# Patient Record
Sex: Male | Born: 1940 | Race: White | Hispanic: No | Marital: Married | Smoking: Former smoker
Health system: Southern US, Community
[De-identification: ages and names within clinical notes are randomized; demographics above are authoritative.]

## PROBLEM LIST (undated history)

## (undated) DIAGNOSIS — K219 Gastro-esophageal reflux disease without esophagitis: Secondary | ICD-10-CM

## (undated) DIAGNOSIS — M109 Gout, unspecified: Secondary | ICD-10-CM

## (undated) DIAGNOSIS — I779 Disorder of arteries and arterioles, unspecified: Secondary | ICD-10-CM

## (undated) DIAGNOSIS — E782 Mixed hyperlipidemia: Secondary | ICD-10-CM

## (undated) DIAGNOSIS — H9193 Unspecified hearing loss, bilateral: Secondary | ICD-10-CM

## (undated) DIAGNOSIS — E559 Vitamin D deficiency, unspecified: Secondary | ICD-10-CM

## (undated) DIAGNOSIS — I350 Nonrheumatic aortic (valve) stenosis: Secondary | ICD-10-CM

## (undated) DIAGNOSIS — E119 Type 2 diabetes mellitus without complications: Secondary | ICD-10-CM

## (undated) DIAGNOSIS — I10 Essential (primary) hypertension: Secondary | ICD-10-CM

## (undated) DIAGNOSIS — M1990 Unspecified osteoarthritis, unspecified site: Secondary | ICD-10-CM

## (undated) DIAGNOSIS — I25119 Atherosclerotic heart disease of native coronary artery with unspecified angina pectoris: Secondary | ICD-10-CM

## (undated) DIAGNOSIS — G5602 Carpal tunnel syndrome, left upper limb: Secondary | ICD-10-CM

## (undated) HISTORY — DX: Gastro-esophageal reflux disease without esophagitis: K21.9

## (undated) HISTORY — PX: LUMBAR FUSION: SHX111

## (undated) HISTORY — DX: Mixed hyperlipidemia: E78.2

## (undated) HISTORY — DX: Carpal tunnel syndrome, left upper limb: G56.02

## (undated) HISTORY — DX: Type 2 diabetes mellitus without complications: E11.9

## (undated) HISTORY — PX: CARPAL TUNNEL RELEASE: SHX101

## (undated) HISTORY — DX: Essential (primary) hypertension: I10

## (undated) HISTORY — DX: Atherosclerotic heart disease of native coronary artery with unspecified angina pectoris: I25.119

## (undated) HISTORY — PX: OTHER SURGICAL HISTORY: SHX169

## (undated) HISTORY — DX: Vitamin D deficiency, unspecified: E55.9

## (undated) HISTORY — DX: Unspecified hearing loss, bilateral: H91.93

## (undated) HISTORY — PX: ESOPHAGOGASTRODUODENOSCOPY (EGD) WITH ESOPHAGEAL DILATION: SHX5812

## (undated) HISTORY — PX: CERVICAL FUSION: SHX112

## (undated) HISTORY — DX: Gout, unspecified: M10.9

---

## 2002-05-22 ENCOUNTER — Encounter: Payer: Self-pay | Admitting: Neurosurgery

## 2002-05-26 ENCOUNTER — Encounter: Payer: Self-pay | Admitting: Neurosurgery

## 2002-05-26 ENCOUNTER — Inpatient Hospital Stay (HOSPITAL_COMMUNITY): Admission: RE | Admit: 2002-05-26 | Discharge: 2002-05-27 | Payer: Self-pay | Admitting: Neurosurgery

## 2011-10-10 DIAGNOSIS — E782 Mixed hyperlipidemia: Secondary | ICD-10-CM | POA: Diagnosis not present

## 2011-10-10 DIAGNOSIS — E119 Type 2 diabetes mellitus without complications: Secondary | ICD-10-CM | POA: Diagnosis not present

## 2011-10-10 DIAGNOSIS — I10 Essential (primary) hypertension: Secondary | ICD-10-CM | POA: Diagnosis not present

## 2011-10-10 DIAGNOSIS — E663 Overweight: Secondary | ICD-10-CM | POA: Diagnosis not present

## 2011-10-16 DIAGNOSIS — C44319 Basal cell carcinoma of skin of other parts of face: Secondary | ICD-10-CM | POA: Diagnosis not present

## 2011-11-05 DIAGNOSIS — D485 Neoplasm of uncertain behavior of skin: Secondary | ICD-10-CM | POA: Diagnosis not present

## 2011-11-05 DIAGNOSIS — C44319 Basal cell carcinoma of skin of other parts of face: Secondary | ICD-10-CM | POA: Diagnosis not present

## 2011-11-05 DIAGNOSIS — L219 Seborrheic dermatitis, unspecified: Secondary | ICD-10-CM | POA: Diagnosis not present

## 2011-11-05 DIAGNOSIS — D2370 Other benign neoplasm of skin of unspecified lower limb, including hip: Secondary | ICD-10-CM | POA: Diagnosis not present

## 2011-11-07 DIAGNOSIS — C44319 Basal cell carcinoma of skin of other parts of face: Secondary | ICD-10-CM | POA: Diagnosis not present

## 2011-11-20 DIAGNOSIS — C44611 Basal cell carcinoma of skin of unspecified upper limb, including shoulder: Secondary | ICD-10-CM | POA: Diagnosis not present

## 2012-03-25 DIAGNOSIS — L219 Seborrheic dermatitis, unspecified: Secondary | ICD-10-CM | POA: Diagnosis not present

## 2012-03-25 DIAGNOSIS — D2330 Other benign neoplasm of skin of unspecified part of face: Secondary | ICD-10-CM | POA: Diagnosis not present

## 2012-06-19 DIAGNOSIS — Z23 Encounter for immunization: Secondary | ICD-10-CM | POA: Diagnosis not present

## 2012-10-23 DIAGNOSIS — E119 Type 2 diabetes mellitus without complications: Secondary | ICD-10-CM | POA: Diagnosis not present

## 2012-10-23 DIAGNOSIS — E782 Mixed hyperlipidemia: Secondary | ICD-10-CM | POA: Diagnosis not present

## 2012-10-23 DIAGNOSIS — Z79899 Other long term (current) drug therapy: Secondary | ICD-10-CM | POA: Diagnosis not present

## 2012-10-23 DIAGNOSIS — Z125 Encounter for screening for malignant neoplasm of prostate: Secondary | ICD-10-CM | POA: Diagnosis not present

## 2013-04-07 DIAGNOSIS — E782 Mixed hyperlipidemia: Secondary | ICD-10-CM | POA: Diagnosis not present

## 2013-04-07 DIAGNOSIS — E119 Type 2 diabetes mellitus without complications: Secondary | ICD-10-CM | POA: Diagnosis not present

## 2013-04-07 DIAGNOSIS — Z79899 Other long term (current) drug therapy: Secondary | ICD-10-CM | POA: Diagnosis not present

## 2013-04-14 DIAGNOSIS — Z0000 Encounter for general adult medical examination without abnormal findings: Secondary | ICD-10-CM | POA: Diagnosis not present

## 2013-04-14 DIAGNOSIS — I10 Essential (primary) hypertension: Secondary | ICD-10-CM | POA: Diagnosis not present

## 2013-04-14 DIAGNOSIS — IMO0002 Reserved for concepts with insufficient information to code with codable children: Secondary | ICD-10-CM | POA: Diagnosis not present

## 2013-04-14 DIAGNOSIS — E782 Mixed hyperlipidemia: Secondary | ICD-10-CM | POA: Diagnosis not present

## 2013-04-22 DIAGNOSIS — M5126 Other intervertebral disc displacement, lumbar region: Secondary | ICD-10-CM | POA: Diagnosis not present

## 2013-04-22 DIAGNOSIS — M48061 Spinal stenosis, lumbar region without neurogenic claudication: Secondary | ICD-10-CM | POA: Diagnosis not present

## 2013-06-04 DIAGNOSIS — M5106 Intervertebral disc disorders with myelopathy, lumbar region: Secondary | ICD-10-CM | POA: Diagnosis not present

## 2013-06-04 DIAGNOSIS — M4310 Spondylolisthesis, site unspecified: Secondary | ICD-10-CM | POA: Diagnosis not present

## 2013-06-04 DIAGNOSIS — M48061 Spinal stenosis, lumbar region without neurogenic claudication: Secondary | ICD-10-CM | POA: Diagnosis not present

## 2013-06-05 DIAGNOSIS — Z01818 Encounter for other preprocedural examination: Secondary | ICD-10-CM | POA: Diagnosis not present

## 2013-06-08 DIAGNOSIS — Z23 Encounter for immunization: Secondary | ICD-10-CM | POA: Diagnosis not present

## 2013-06-08 DIAGNOSIS — Z79899 Other long term (current) drug therapy: Secondary | ICD-10-CM | POA: Diagnosis not present

## 2013-06-08 DIAGNOSIS — I10 Essential (primary) hypertension: Secondary | ICD-10-CM | POA: Diagnosis present

## 2013-06-08 DIAGNOSIS — E119 Type 2 diabetes mellitus without complications: Secondary | ICD-10-CM | POA: Diagnosis present

## 2013-06-08 DIAGNOSIS — M4716 Other spondylosis with myelopathy, lumbar region: Secondary | ICD-10-CM | POA: Diagnosis not present

## 2013-06-08 DIAGNOSIS — Z4789 Encounter for other orthopedic aftercare: Secondary | ICD-10-CM | POA: Diagnosis not present

## 2013-06-08 DIAGNOSIS — K219 Gastro-esophageal reflux disease without esophagitis: Secondary | ICD-10-CM | POA: Diagnosis present

## 2013-06-08 DIAGNOSIS — M48061 Spinal stenosis, lumbar region without neurogenic claudication: Secondary | ICD-10-CM | POA: Diagnosis not present

## 2013-06-08 DIAGNOSIS — Q762 Congenital spondylolisthesis: Secondary | ICD-10-CM | POA: Diagnosis not present

## 2013-06-08 DIAGNOSIS — Z9889 Other specified postprocedural states: Secondary | ICD-10-CM | POA: Diagnosis not present

## 2013-07-03 DIAGNOSIS — Z23 Encounter for immunization: Secondary | ICD-10-CM | POA: Diagnosis not present

## 2013-07-08 DIAGNOSIS — M5106 Intervertebral disc disorders with myelopathy, lumbar region: Secondary | ICD-10-CM | POA: Diagnosis not present

## 2013-07-08 DIAGNOSIS — M48061 Spinal stenosis, lumbar region without neurogenic claudication: Secondary | ICD-10-CM | POA: Diagnosis not present

## 2013-07-08 DIAGNOSIS — Q762 Congenital spondylolisthesis: Secondary | ICD-10-CM | POA: Diagnosis not present

## 2013-07-08 DIAGNOSIS — M4716 Other spondylosis with myelopathy, lumbar region: Secondary | ICD-10-CM | POA: Diagnosis not present

## 2013-09-03 DIAGNOSIS — M5106 Intervertebral disc disorders with myelopathy, lumbar region: Secondary | ICD-10-CM | POA: Diagnosis not present

## 2013-09-03 DIAGNOSIS — Q762 Congenital spondylolisthesis: Secondary | ICD-10-CM | POA: Diagnosis not present

## 2013-09-03 DIAGNOSIS — M48061 Spinal stenosis, lumbar region without neurogenic claudication: Secondary | ICD-10-CM | POA: Diagnosis not present

## 2013-09-03 DIAGNOSIS — M4716 Other spondylosis with myelopathy, lumbar region: Secondary | ICD-10-CM | POA: Diagnosis not present

## 2013-10-27 DIAGNOSIS — E119 Type 2 diabetes mellitus without complications: Secondary | ICD-10-CM | POA: Diagnosis not present

## 2013-10-27 DIAGNOSIS — Z1329 Encounter for screening for other suspected endocrine disorder: Secondary | ICD-10-CM | POA: Diagnosis not present

## 2013-10-27 DIAGNOSIS — Z130 Encounter for screening for diseases of the blood and blood-forming organs and certain disorders involving the immune mechanism: Secondary | ICD-10-CM | POA: Diagnosis not present

## 2013-10-27 DIAGNOSIS — I10 Essential (primary) hypertension: Secondary | ICD-10-CM | POA: Diagnosis not present

## 2013-10-27 DIAGNOSIS — E559 Vitamin D deficiency, unspecified: Secondary | ICD-10-CM | POA: Diagnosis not present

## 2013-10-27 DIAGNOSIS — E782 Mixed hyperlipidemia: Secondary | ICD-10-CM | POA: Diagnosis not present

## 2013-10-27 DIAGNOSIS — K2100 Gastro-esophageal reflux disease with esophagitis, without bleeding: Secondary | ICD-10-CM | POA: Diagnosis not present

## 2013-10-27 DIAGNOSIS — Z125 Encounter for screening for malignant neoplasm of prostate: Secondary | ICD-10-CM | POA: Diagnosis not present

## 2013-12-10 DIAGNOSIS — M4716 Other spondylosis with myelopathy, lumbar region: Secondary | ICD-10-CM | POA: Diagnosis not present

## 2013-12-10 DIAGNOSIS — M5106 Intervertebral disc disorders with myelopathy, lumbar region: Secondary | ICD-10-CM | POA: Diagnosis not present

## 2013-12-10 DIAGNOSIS — Q762 Congenital spondylolisthesis: Secondary | ICD-10-CM | POA: Diagnosis not present

## 2013-12-10 DIAGNOSIS — M48061 Spinal stenosis, lumbar region without neurogenic claudication: Secondary | ICD-10-CM | POA: Diagnosis not present

## 2014-01-25 DIAGNOSIS — E119 Type 2 diabetes mellitus without complications: Secondary | ICD-10-CM | POA: Diagnosis not present

## 2014-01-26 DIAGNOSIS — L219 Seborrheic dermatitis, unspecified: Secondary | ICD-10-CM | POA: Diagnosis not present

## 2014-01-26 DIAGNOSIS — C44211 Basal cell carcinoma of skin of unspecified ear and external auricular canal: Secondary | ICD-10-CM | POA: Diagnosis not present

## 2014-01-26 DIAGNOSIS — L570 Actinic keratosis: Secondary | ICD-10-CM | POA: Diagnosis not present

## 2014-02-10 DIAGNOSIS — C44211 Basal cell carcinoma of skin of unspecified ear and external auricular canal: Secondary | ICD-10-CM | POA: Diagnosis not present

## 2014-06-04 DIAGNOSIS — Z23 Encounter for immunization: Secondary | ICD-10-CM | POA: Diagnosis not present

## 2014-06-04 DIAGNOSIS — J209 Acute bronchitis, unspecified: Secondary | ICD-10-CM | POA: Diagnosis not present

## 2014-06-04 DIAGNOSIS — E119 Type 2 diabetes mellitus without complications: Secondary | ICD-10-CM | POA: Diagnosis not present

## 2014-06-22 DIAGNOSIS — B079 Viral wart, unspecified: Secondary | ICD-10-CM | POA: Diagnosis not present

## 2014-06-22 DIAGNOSIS — L570 Actinic keratosis: Secondary | ICD-10-CM | POA: Diagnosis not present

## 2014-06-22 DIAGNOSIS — L821 Other seborrheic keratosis: Secondary | ICD-10-CM | POA: Diagnosis not present

## 2014-07-12 DIAGNOSIS — H9193 Unspecified hearing loss, bilateral: Secondary | ICD-10-CM | POA: Diagnosis not present

## 2014-07-15 DIAGNOSIS — H9313 Tinnitus, bilateral: Secondary | ICD-10-CM | POA: Diagnosis not present

## 2014-07-15 DIAGNOSIS — H903 Sensorineural hearing loss, bilateral: Secondary | ICD-10-CM | POA: Diagnosis not present

## 2014-08-16 DIAGNOSIS — J40 Bronchitis, not specified as acute or chronic: Secondary | ICD-10-CM | POA: Diagnosis not present

## 2014-10-14 DIAGNOSIS — M5431 Sciatica, right side: Secondary | ICD-10-CM | POA: Diagnosis not present

## 2014-10-28 DIAGNOSIS — K219 Gastro-esophageal reflux disease without esophagitis: Secondary | ICD-10-CM | POA: Diagnosis not present

## 2014-10-28 DIAGNOSIS — Z0000 Encounter for general adult medical examination without abnormal findings: Secondary | ICD-10-CM | POA: Diagnosis not present

## 2014-10-28 DIAGNOSIS — E559 Vitamin D deficiency, unspecified: Secondary | ICD-10-CM | POA: Diagnosis not present

## 2014-10-28 DIAGNOSIS — E782 Mixed hyperlipidemia: Secondary | ICD-10-CM | POA: Diagnosis not present

## 2014-10-28 DIAGNOSIS — Z23 Encounter for immunization: Secondary | ICD-10-CM | POA: Diagnosis not present

## 2014-10-28 DIAGNOSIS — E1165 Type 2 diabetes mellitus with hyperglycemia: Secondary | ICD-10-CM | POA: Diagnosis not present

## 2014-10-28 DIAGNOSIS — I10 Essential (primary) hypertension: Secondary | ICD-10-CM | POA: Diagnosis not present

## 2014-10-28 DIAGNOSIS — Z79899 Other long term (current) drug therapy: Secondary | ICD-10-CM | POA: Diagnosis not present

## 2014-10-28 DIAGNOSIS — Z125 Encounter for screening for malignant neoplasm of prostate: Secondary | ICD-10-CM | POA: Diagnosis not present

## 2014-11-02 DIAGNOSIS — Z1211 Encounter for screening for malignant neoplasm of colon: Secondary | ICD-10-CM | POA: Diagnosis not present

## 2014-11-25 DIAGNOSIS — H524 Presbyopia: Secondary | ICD-10-CM | POA: Diagnosis not present

## 2014-11-25 DIAGNOSIS — E119 Type 2 diabetes mellitus without complications: Secondary | ICD-10-CM | POA: Diagnosis not present

## 2014-11-25 DIAGNOSIS — H2513 Age-related nuclear cataract, bilateral: Secondary | ICD-10-CM | POA: Diagnosis not present

## 2015-01-17 DIAGNOSIS — T1512XA Foreign body in conjunctival sac, left eye, initial encounter: Secondary | ICD-10-CM | POA: Diagnosis not present

## 2015-04-15 DIAGNOSIS — E119 Type 2 diabetes mellitus without complications: Secondary | ICD-10-CM | POA: Diagnosis not present

## 2015-04-15 DIAGNOSIS — I10 Essential (primary) hypertension: Secondary | ICD-10-CM | POA: Diagnosis not present

## 2015-04-15 DIAGNOSIS — E780 Pure hypercholesterolemia: Secondary | ICD-10-CM | POA: Diagnosis not present

## 2015-04-15 DIAGNOSIS — S335XXA Sprain of ligaments of lumbar spine, initial encounter: Secondary | ICD-10-CM | POA: Diagnosis not present

## 2015-04-26 DIAGNOSIS — Z1389 Encounter for screening for other disorder: Secondary | ICD-10-CM | POA: Diagnosis not present

## 2015-04-26 DIAGNOSIS — Z9181 History of falling: Secondary | ICD-10-CM | POA: Diagnosis not present

## 2015-04-26 DIAGNOSIS — R1314 Dysphagia, pharyngoesophageal phase: Secondary | ICD-10-CM | POA: Diagnosis not present

## 2015-04-26 DIAGNOSIS — M545 Low back pain: Secondary | ICD-10-CM | POA: Diagnosis not present

## 2015-05-12 DIAGNOSIS — K219 Gastro-esophageal reflux disease without esophagitis: Secondary | ICD-10-CM | POA: Diagnosis not present

## 2015-05-12 DIAGNOSIS — R1310 Dysphagia, unspecified: Secondary | ICD-10-CM | POA: Diagnosis not present

## 2015-05-13 DIAGNOSIS — R1310 Dysphagia, unspecified: Secondary | ICD-10-CM | POA: Diagnosis not present

## 2015-05-19 DIAGNOSIS — I10 Essential (primary) hypertension: Secondary | ICD-10-CM | POA: Diagnosis not present

## 2015-05-19 DIAGNOSIS — K222 Esophageal obstruction: Secondary | ICD-10-CM | POA: Diagnosis not present

## 2015-05-19 DIAGNOSIS — F17220 Nicotine dependence, chewing tobacco, uncomplicated: Secondary | ICD-10-CM | POA: Diagnosis not present

## 2015-05-19 DIAGNOSIS — K2270 Barrett's esophagus without dysplasia: Secondary | ICD-10-CM | POA: Diagnosis not present

## 2015-05-19 DIAGNOSIS — E119 Type 2 diabetes mellitus without complications: Secondary | ICD-10-CM | POA: Diagnosis not present

## 2015-05-19 DIAGNOSIS — R1310 Dysphagia, unspecified: Secondary | ICD-10-CM | POA: Diagnosis not present

## 2015-05-19 DIAGNOSIS — K449 Diaphragmatic hernia without obstruction or gangrene: Secondary | ICD-10-CM | POA: Diagnosis not present

## 2015-06-17 DIAGNOSIS — Z23 Encounter for immunization: Secondary | ICD-10-CM | POA: Diagnosis not present

## 2015-06-23 DIAGNOSIS — R1310 Dysphagia, unspecified: Secondary | ICD-10-CM | POA: Diagnosis not present

## 2015-06-23 DIAGNOSIS — K222 Esophageal obstruction: Secondary | ICD-10-CM | POA: Diagnosis not present

## 2015-06-23 DIAGNOSIS — K2270 Barrett's esophagus without dysplasia: Secondary | ICD-10-CM | POA: Diagnosis not present

## 2015-10-05 DIAGNOSIS — G5603 Carpal tunnel syndrome, bilateral upper limbs: Secondary | ICD-10-CM | POA: Diagnosis not present

## 2015-10-10 DIAGNOSIS — M79642 Pain in left hand: Secondary | ICD-10-CM | POA: Diagnosis not present

## 2015-10-10 DIAGNOSIS — M79641 Pain in right hand: Secondary | ICD-10-CM | POA: Diagnosis not present

## 2015-10-26 DIAGNOSIS — M65341 Trigger finger, right ring finger: Secondary | ICD-10-CM | POA: Diagnosis not present

## 2015-10-26 DIAGNOSIS — M65331 Trigger finger, right middle finger: Secondary | ICD-10-CM | POA: Diagnosis not present

## 2015-10-26 DIAGNOSIS — G5601 Carpal tunnel syndrome, right upper limb: Secondary | ICD-10-CM | POA: Diagnosis not present

## 2015-11-02 DIAGNOSIS — E1165 Type 2 diabetes mellitus with hyperglycemia: Secondary | ICD-10-CM | POA: Diagnosis not present

## 2015-11-02 DIAGNOSIS — E782 Mixed hyperlipidemia: Secondary | ICD-10-CM | POA: Diagnosis not present

## 2015-11-02 DIAGNOSIS — H9193 Unspecified hearing loss, bilateral: Secondary | ICD-10-CM | POA: Diagnosis not present

## 2015-11-02 DIAGNOSIS — Z79899 Other long term (current) drug therapy: Secondary | ICD-10-CM | POA: Diagnosis not present

## 2015-11-02 DIAGNOSIS — K219 Gastro-esophageal reflux disease without esophagitis: Secondary | ICD-10-CM | POA: Diagnosis not present

## 2015-11-02 DIAGNOSIS — Z125 Encounter for screening for malignant neoplasm of prostate: Secondary | ICD-10-CM | POA: Diagnosis not present

## 2015-11-02 DIAGNOSIS — E559 Vitamin D deficiency, unspecified: Secondary | ICD-10-CM | POA: Diagnosis not present

## 2015-11-02 DIAGNOSIS — Z0000 Encounter for general adult medical examination without abnormal findings: Secondary | ICD-10-CM | POA: Diagnosis not present

## 2015-11-08 DIAGNOSIS — G5601 Carpal tunnel syndrome, right upper limb: Secondary | ICD-10-CM | POA: Diagnosis not present

## 2015-11-08 DIAGNOSIS — M65331 Trigger finger, right middle finger: Secondary | ICD-10-CM | POA: Diagnosis not present

## 2015-11-08 DIAGNOSIS — M65341 Trigger finger, right ring finger: Secondary | ICD-10-CM | POA: Diagnosis not present

## 2015-12-01 DIAGNOSIS — H524 Presbyopia: Secondary | ICD-10-CM | POA: Diagnosis not present

## 2015-12-01 DIAGNOSIS — H25811 Combined forms of age-related cataract, right eye: Secondary | ICD-10-CM | POA: Diagnosis not present

## 2015-12-01 DIAGNOSIS — H2512 Age-related nuclear cataract, left eye: Secondary | ICD-10-CM | POA: Diagnosis not present

## 2015-12-01 DIAGNOSIS — E113293 Type 2 diabetes mellitus with mild nonproliferative diabetic retinopathy without macular edema, bilateral: Secondary | ICD-10-CM | POA: Diagnosis not present

## 2016-05-25 ENCOUNTER — Other Ambulatory Visit: Payer: Self-pay

## 2016-06-18 DIAGNOSIS — Z0000 Encounter for general adult medical examination without abnormal findings: Secondary | ICD-10-CM | POA: Diagnosis not present

## 2016-06-18 DIAGNOSIS — Z23 Encounter for immunization: Secondary | ICD-10-CM | POA: Diagnosis not present

## 2016-09-26 DIAGNOSIS — H6501 Acute serous otitis media, right ear: Secondary | ICD-10-CM | POA: Diagnosis not present

## 2016-09-26 DIAGNOSIS — H6121 Impacted cerumen, right ear: Secondary | ICD-10-CM | POA: Diagnosis not present

## 2016-11-06 DIAGNOSIS — E782 Mixed hyperlipidemia: Secondary | ICD-10-CM | POA: Diagnosis not present

## 2016-11-06 DIAGNOSIS — K219 Gastro-esophageal reflux disease without esophagitis: Secondary | ICD-10-CM | POA: Diagnosis not present

## 2016-11-06 DIAGNOSIS — E1165 Type 2 diabetes mellitus with hyperglycemia: Secondary | ICD-10-CM | POA: Diagnosis not present

## 2016-11-06 DIAGNOSIS — H9193 Unspecified hearing loss, bilateral: Secondary | ICD-10-CM | POA: Diagnosis not present

## 2016-11-06 DIAGNOSIS — Q762 Congenital spondylolisthesis: Secondary | ICD-10-CM | POA: Diagnosis not present

## 2016-11-06 DIAGNOSIS — Z79899 Other long term (current) drug therapy: Secondary | ICD-10-CM | POA: Diagnosis not present

## 2016-11-06 DIAGNOSIS — Z125 Encounter for screening for malignant neoplasm of prostate: Secondary | ICD-10-CM | POA: Diagnosis not present

## 2016-11-06 DIAGNOSIS — Z0000 Encounter for general adult medical examination without abnormal findings: Secondary | ICD-10-CM | POA: Diagnosis not present

## 2016-11-13 DIAGNOSIS — Z1211 Encounter for screening for malignant neoplasm of colon: Secondary | ICD-10-CM | POA: Diagnosis not present

## 2016-12-03 DIAGNOSIS — E113293 Type 2 diabetes mellitus with mild nonproliferative diabetic retinopathy without macular edema, bilateral: Secondary | ICD-10-CM | POA: Diagnosis not present

## 2016-12-03 DIAGNOSIS — H25813 Combined forms of age-related cataract, bilateral: Secondary | ICD-10-CM | POA: Diagnosis not present

## 2017-02-01 DIAGNOSIS — T1511XA Foreign body in conjunctival sac, right eye, initial encounter: Secondary | ICD-10-CM | POA: Diagnosis not present

## 2017-02-14 DIAGNOSIS — H6123 Impacted cerumen, bilateral: Secondary | ICD-10-CM | POA: Diagnosis not present

## 2017-02-14 DIAGNOSIS — H9212 Otorrhea, left ear: Secondary | ICD-10-CM | POA: Diagnosis not present

## 2017-02-21 DIAGNOSIS — H9212 Otorrhea, left ear: Secondary | ICD-10-CM | POA: Diagnosis not present

## 2017-04-05 DIAGNOSIS — M10071 Idiopathic gout, right ankle and foot: Secondary | ICD-10-CM | POA: Diagnosis not present

## 2017-05-30 DIAGNOSIS — Z23 Encounter for immunization: Secondary | ICD-10-CM | POA: Diagnosis not present

## 2017-06-04 DIAGNOSIS — H04123 Dry eye syndrome of bilateral lacrimal glands: Secondary | ICD-10-CM | POA: Diagnosis not present

## 2017-06-04 DIAGNOSIS — E113293 Type 2 diabetes mellitus with mild nonproliferative diabetic retinopathy without macular edema, bilateral: Secondary | ICD-10-CM | POA: Diagnosis not present

## 2017-06-04 DIAGNOSIS — H2513 Age-related nuclear cataract, bilateral: Secondary | ICD-10-CM | POA: Diagnosis not present

## 2017-06-04 DIAGNOSIS — H524 Presbyopia: Secondary | ICD-10-CM | POA: Diagnosis not present

## 2020-01-14 ENCOUNTER — Encounter: Payer: Self-pay | Admitting: Thoracic Surgery (Cardiothoracic Vascular Surgery)

## 2020-01-14 ENCOUNTER — Encounter: Payer: Self-pay | Admitting: *Deleted

## 2020-01-14 DIAGNOSIS — R0989 Other specified symptoms and signs involving the circulatory and respiratory systems: Secondary | ICD-10-CM

## 2020-01-14 DIAGNOSIS — I209 Angina pectoris, unspecified: Secondary | ICD-10-CM | POA: Insufficient documentation

## 2020-01-14 DIAGNOSIS — H9193 Unspecified hearing loss, bilateral: Secondary | ICD-10-CM | POA: Insufficient documentation

## 2020-01-14 DIAGNOSIS — I10 Essential (primary) hypertension: Secondary | ICD-10-CM | POA: Insufficient documentation

## 2020-01-14 DIAGNOSIS — M109 Gout, unspecified: Secondary | ICD-10-CM | POA: Insufficient documentation

## 2020-01-14 DIAGNOSIS — I25118 Atherosclerotic heart disease of native coronary artery with other forms of angina pectoris: Secondary | ICD-10-CM | POA: Insufficient documentation

## 2020-01-14 DIAGNOSIS — G5600 Carpal tunnel syndrome, unspecified upper limb: Secondary | ICD-10-CM | POA: Insufficient documentation

## 2020-01-14 DIAGNOSIS — E119 Type 2 diabetes mellitus without complications: Secondary | ICD-10-CM | POA: Insufficient documentation

## 2020-01-14 DIAGNOSIS — E782 Mixed hyperlipidemia: Secondary | ICD-10-CM | POA: Insufficient documentation

## 2020-01-14 DIAGNOSIS — G5602 Carpal tunnel syndrome, left upper limb: Secondary | ICD-10-CM | POA: Insufficient documentation

## 2020-01-14 DIAGNOSIS — E559 Vitamin D deficiency, unspecified: Secondary | ICD-10-CM | POA: Insufficient documentation

## 2020-01-14 DIAGNOSIS — R0609 Other forms of dyspnea: Secondary | ICD-10-CM | POA: Insufficient documentation

## 2020-01-14 DIAGNOSIS — I350 Nonrheumatic aortic (valve) stenosis: Secondary | ICD-10-CM | POA: Insufficient documentation

## 2020-01-14 DIAGNOSIS — K219 Gastro-esophageal reflux disease without esophagitis: Secondary | ICD-10-CM | POA: Insufficient documentation

## 2020-01-14 DIAGNOSIS — I25119 Atherosclerotic heart disease of native coronary artery with unspecified angina pectoris: Secondary | ICD-10-CM | POA: Insufficient documentation

## 2020-01-14 DIAGNOSIS — K2100 Gastro-esophageal reflux disease with esophagitis, without bleeding: Secondary | ICD-10-CM

## 2020-01-14 NOTE — Progress Notes (Signed)
Outside notes abstracted.

## 2020-01-15 ENCOUNTER — Other Ambulatory Visit: Payer: Self-pay

## 2020-01-15 ENCOUNTER — Encounter: Payer: Self-pay | Admitting: Physician Assistant

## 2020-01-15 ENCOUNTER — Ambulatory Visit
Admission: RE | Admit: 2020-01-15 | Discharge: 2020-01-15 | Disposition: A | Discharge status: Home / Self Care | Payer: Self-pay | Source: Ambulatory Visit | Attending: Thoracic Surgery (Cardiothoracic Vascular Surgery) | Admitting: Thoracic Surgery (Cardiothoracic Vascular Surgery)

## 2020-01-15 ENCOUNTER — Other Ambulatory Visit: Payer: Self-pay | Admitting: Thoracic Surgery (Cardiothoracic Vascular Surgery)

## 2020-01-15 ENCOUNTER — Other Ambulatory Visit: Payer: Self-pay | Admitting: *Deleted

## 2020-01-15 ENCOUNTER — Institutional Professional Consult (permissible substitution) (INDEPENDENT_AMBULATORY_CARE_PROVIDER_SITE_OTHER): Payer: Medicare Other | Admitting: Thoracic Surgery (Cardiothoracic Vascular Surgery)

## 2020-01-15 ENCOUNTER — Encounter: Payer: Self-pay | Admitting: Thoracic Surgery (Cardiothoracic Vascular Surgery)

## 2020-01-15 VITALS — BP 146/74 | HR 70 | Temp 97.9°F | Resp 20 | Ht 67.0 in | Wt 205.0 lb

## 2020-01-15 DIAGNOSIS — I25119 Atherosclerotic heart disease of native coronary artery with unspecified angina pectoris: Secondary | ICD-10-CM | POA: Diagnosis not present

## 2020-01-15 DIAGNOSIS — R0609 Other forms of dyspnea: Secondary | ICD-10-CM

## 2020-01-15 DIAGNOSIS — I2510 Atherosclerotic heart disease of native coronary artery without angina pectoris: Secondary | ICD-10-CM

## 2020-01-15 DIAGNOSIS — I350 Nonrheumatic aortic (valve) stenosis: Secondary | ICD-10-CM | POA: Diagnosis not present

## 2020-01-15 DIAGNOSIS — I25118 Atherosclerotic heart disease of native coronary artery with other forms of angina pectoris: Secondary | ICD-10-CM | POA: Diagnosis not present

## 2020-01-15 DIAGNOSIS — R0600 Dyspnea, unspecified: Secondary | ICD-10-CM | POA: Diagnosis not present

## 2020-01-15 NOTE — Progress Notes (Signed)
HE[facility] AND [place] [facility] CONSULTATION REPORT  Referring Provider is Bishop Limbo, DO Primary Cardiologist is Flossie Buffy., MD PCP is Myrlene Broker, MD  Chief Complaint  Patient presents with  . Aortic Stenosis    Surgical consultation  . Coronary Artery Disease    HPI:  Patient is a 79 year old male with history of aortic stenosis, coronary artery disease, hypertension, hyperlipidemia, and type 2 diabetes mellitus who has been referred for surgical consultation to discuss treatment options for management of severe symptomatic aortic stenosis and coronary artery disease with exertional angina.  Patient states that he has been told that he had a heart murmur all of his life.  He has remained physically active and without significant limitation until recently.  He describes a gradual onset of symptoms of exertional shortness of breath that occur typically only with more strenuous physical exertion.  Several months ago the patient began to experience substernal chest tightness with radiation to the left shoulder and left arm that occur with strenuous physical exertion.  This has become more frequent and reproducible, although patient states that symptoms only develop with more strenuous activity and are always relieved by rest.  He was referred for formal cardiology consultation and has been evaluated previously by Dr. Otho Perl at [facility].  By report Baseline twelve-lead EKG revealed sinus rhythm with no significant AV conduction delay but findings suggestive of previous anteroseptal myocardial infarction.  Transthoracic echocardiogram performed December 08, 2019 revealed normal left ventricular systolic function with at least moderate aortic stenosis.  Peak velocity across the aortic valve measured 3.8 m/s corresponding to mean transvalvular gradient estimated 33 mmHg.  There was mild aortic insufficiency and  mild mitral regurgitation.  DVI was reported 0.24 with aortic valve area estimated 0.76 cm.  Left and right heart catheterization was performed January 12, 2020 by Dr. Quillian Quince.  Catheterization revealed severe aortic stenosis with mean transvalvular gradient measured 38 mmHg corresponding to aortic valve area calculated 0.94 cm.  There was mild pulmonary hypertension.  Coronary angiography revealed high-grade focal severe stenosis of the mid left anterior descending coronary artery and moderate stenosis of proximal segment of the large second obtuse marginal branch of the left circumflex coronary artery.  There was nonobstructive disease in the right coronary circulation.  Cardiothoracic surgical consultation was requested to consider conventional surgical intervention versus transcatheter aortic valve replacement with possible PCI.  Patient is married and lives in [place] with his wife.  He has been retired for approximately 17 years having previously worked at the [employer].  He has remained physically active and entirely functionally independent throughout his adult life and retirement.  He enjoys working on his property and continues to perform fairly strenuous physical activities working around [employer] and planting his garden.  He does not exercise on a regular basis.  He states that he has had some mild exertional shortness of breath for several years but over the last several months he has really had to cut back on activity because of worsening symptoms of exertional chest pain and shortness of breath.  Symptoms are only brought on with more strenuous physical exertion and are always promptly relieved by rest.  He denies any prolonged episodes of chest discomfort.  He denies any history of resting chest pain, PND, orthopnea, or lower extremity edema.  He denies any history of palpitations, dizziness, or syncope.  Past Medical History:  Diagnosis Date  . Aortic  stenosis   . Bilateral  carotid bruits   . Bilateral hearing loss   . Carpal tunnel syndrome on left   . Coronary artery disease   . Essential hypertension   . Exertional dyspnea   . GERD (gastroesophageal reflux disease)    WITH ESOPHAGITIS  . Gout   . Mixed hyperlipidemia   . Type II diabetes mellitus ([place])   . Vitamin D deficiency     History reviewed. No pertinent surgical history.  History reviewed. No pertinent family history.  Social History   Socioeconomic History  . Marital status: Married    Spouse name: Not on file  . Number of children: Not on file  . Years of education: Not on file  . Highest education level: Not on file  Occupational History  . Not on file  Tobacco Use  . Smoking status: Never Smoker  . Smokeless tobacco: Current User  Substance and Sexual Activity  . Alcohol use: Not on file  . Drug use: Not on file  . Sexual activity: Not on file  Other Topics Concern  . Not on file  Social History Narrative  . Not on file   Social Determinants of Health   Financial Resource Strain:   . Difficulty of Paying Living Expenses:   Food Insecurity:   . Worried About Charity fundraiser in the Last Year:   . Arboriculturist in the Last Year:   Transportation Needs:   . Film/video editor (Medical):   Marland Kitchen Lack of Transportation (Non-Medical):   Physical Activity:   . Days of Exercise per Week:   . Minutes of Exercise per Session:   Stress:   . Feeling of Stress :   Social Connections:   . Frequency of Communication with Friends and Family:   . Frequency of Social Gatherings with Friends and Family:   . Attends Religious Services:   . Active Member of Clubs or Organizations:   . Attends Archivist Meetings:   Marland Kitchen Marital Status:   Intimate Partner Violence:   . Fear of Current or Ex-Partner:   . Emotionally Abused:   Marland Kitchen Physically Abused:   . Sexually Abused:     Current Outpatient Medications  Medication Sig Dispense Refill  . amLODipine (NORVASC) 10 MG  tablet Take 10 mg by mouth daily.    Marland Kitchen aspirin EC 81 MG tablet Take 81 mg by mouth daily.    Marland Kitchen atorvastatin (LIPITOR) 10 MG tablet Take 10 mg by mouth daily.    Marland Kitchen glipiZIDE (GLUCOTROL XL) 5 MG 24 hr tablet Take 5 mg by mouth in the morning and at bedtime.    . metFORMIN (GLUCOPHAGE) 1000 MG tablet Take 1,000 mg by mouth 2 (two) times daily with a meal.    . omeprazole (PRILOSEC) 40 MG capsule Take 40 mg by mouth daily.    Marland Kitchen telmisartan-hydrochlorothiazide (MICARDIS HCT) 40-12.5 MG tablet Take 1 tablet by mouth daily.     No current facility-administered medications for this visit.    No Known Allergies    Review of Systems:   General:  normal appetite, decreased energy, no weight gain, no weight loss, no fever  Cardiac:  + chest pain with exertion, no chest pain at rest, + SOB with exertion, no resting SOB, no PND, no orthopnea, no palpitations, no arrhythmia, no atrial fibrillation, no LE edema, no dizzy spells, no syncope  Respiratory:  + exertional shortness of breath, no home oxygen, no productive cough, no  dry cough, no bronchitis, no wheezing, no hemoptysis, no asthma, no pain with inspiration or cough, no sleep apnea, no CPAP at night  GI:   occasional difficulty swallowing, + reflux, no frequent heartburn, no hiatal hernia, no abdominal pain, no constipation, no diarrhea, no hematochezia, no hematemesis, no melena  GU:   no dysuria,  no frequency, no urinary tract infection, no hematuria, no enlarged prostate, no kidney stones, no kidney disease  Vascular:  no pain suggestive of claudication, no pain in feet, no leg cramps, no varicose veins, no DVT, no non-healing foot ulcer  Neuro:   no stroke, no TIA's, no seizures, no headaches, no temporary blindness one eye,  no slurred speech, no peripheral neuropathy, no chronic pain, no instability of gait, no memory/cognitive dysfunction  Musculoskeletal: + arthritis, no joint swelling, no myalgias, no difficulty walking, normal mobility    Skin:   no rash, no itching, no skin infections, no pressure sores or ulcerations  Psych:   no anxiety, no depression, no nervousness, no unusual recent stress  Eyes:   no blurry vision, no floaters, no recent vision changes, does not wear glasses or contacts  ENT:   + hearing loss, no loose or painful teeth, no dentures, last saw dentist 1 month ago  Hematologic:  no easy bruising, no abnormal bleeding, no clotting disorder, no frequent epistaxis  Endocrine:  + diabetes, does not check CBG's at home           Physical Exam:   BP (!) 146/74 (BP Location: Right Arm, Patient Position: Sitting, Cuff Size: Normal)   Pulse 70   Temp 97.9 F (36.6 C) (Temporal)   Resp 20   Ht 5\' 7"  (1.702 m)   Wt 205 lb (93 kg)   SpO2 95% Comment: RA  BMI 32.11 kg/m   General:    well-appearing  HEENT:  Unremarkable   Neck:   no JVD, no bruits, no adenopathy   Chest:   clear to auscultation, symmetrical breath sounds, no wheezes, no rhonchi   CV:   RRR, grade II/VI crescendo/decrescendo murmur heard best at RUSB,  no diastolic murmur  Abdomen:  soft, non-tender, no masses   Extremities:  warm, well-perfused, pulses palpable, no LE edema  Rectal/GU  Deferred  Neuro:   Grossly non-focal and symmetrical throughout  Skin:   Clean and dry, no rashes, no breakdown   Diagnostic Tests:  TRANSTHORACIC ECHOCARDIOGRAM  Left Ventricular EF 55 %  Result Narrative                   [facility]                 [facility]                 Cardiac Ultrasound-                 [place]                 [address]                 [place] [address]          Transthoracic Echocardiogram Report Name: Andre Jimenez, TISSOT.  Study Date: 12/08/2019   Height: 67 in MRN:  [id number]                             Weight: 210 lb DOB: 08-03-1941              Gender: Male        BSA: 2.1 m2 Age: 4 yrs                Ethnicity: W        BP: 140/80 mmHg Reason For Study: Murmur; Essential hypertension; Heart murmur Ordering Physician: Abran Richard     Performed By: Minus Breeding Referring Physician: Abran Richard [place] Left ventricular systolic function is normal. LV ejection fraction = 55-60%. Diffuse thickening of the aortic valve with restricted cusp opening. There is moderate aortic stenosis. Aortic valve mean pressure gradient is 33 mmHg. There is mild aortic regurgitation. There is mild mitral regurgitation. No old echo for comparison - FINDINGS: LEFT VENTRICLE The left ventricular size is normal. There is borderline concentric left ventricular hypertrophy. LV ejection fraction = 55-60%. Left ventricular systolic function is normal. Left ventricular filling pattern is prolonged relaxation. The left ventricular wall motion is normal.  - RIGHT VENTRICLE The right ventricle is normal in size and function.  LEFT ATRIUM The left atrium is mildly dilated.  RIGHT ATRIUM Right atrial size is normal. - AORTIC VALVE Diffuse thickening of the aortic valve with restricted cusp opening. There is moderate aortic stenosis. Aortic valve mean pressure gradient is 33 mmHg. There is mild aortic regurgitation. - MITRAL VALVE There is mild mitral annular calcification. There is mild mitral regurgitation. - TRICUSPID VALVE The tricuspid valve is normal in structure and function. No tricuspid regurgitation. - PULMONIC VALVE The pulmonic valve is normal in structure and function. - ARTERIES The aortic sinus is normal size. - VENOUS IVC size was normal. - EFFUSION There is no pericardial effusion. There is no pleural effusion. - -  MMode/2D Measurements &  Calculations IVSd: 1.1 cm    LA dim: 4.5 cm   ESV(MOD-sp4):LVOT diam: 2.0 cm LVIDd: 5.4 cm   EDV(MOD-sp4):   64.7 ml LVPWd: 1.1 cm   147.0 ml LVIDs: 3.8 cm     _______________________________________________________________________ SV(MOD-sp4):    EF A4C: 56.0 %   LA ESV (BP): LA ESV Index (A2C): 82.3 ml                73.2 ml   35.0 ml/m2 SI(MOD-sp4): 39.9 ml/m2     _______________________________________________________________________ LA ESV Index (A4C):LA ESV Index (BP): SV A4C: 34.8 ml/m2     35.5 ml/m2     82.3 ml  Time Measurements MM R-R int: 1.0 sec  Doppler Measurements & Calculations MV E max vel:   MV dec time:SV(LVOT): 68.3 ml   AI max vel: 114.0 cm/sec   0.27 sec  Ao V2 max:      372.6 cm/sec MV A max vel:         372.9 cm/sec     AI dec slope: 125.6 cm/sec         Ao max PG: 55.7 mmHg 216.5 cm/sec2 MV E/A: 0.91         Ao V2 mean:      AI P1/2t: Med Peak E' Vel:       265.9 cm/sec     503.9 msec 3.7 cm/sec  Ao mean PG: 31.5 mmHg Lat Peak E' Vel:       Ao V2 VTI: 90.2 cm 3.5 cm/sec          AVA (VTI): 0.76 cm2 E/Lat E`: 32.7 E/Med E`: 30.8     _______________________________________________________________________ LV V1 VTI:    PA max PG: AS Dimensionless   AVAi(VTI) 22.1 cm      2.6 mmHg  Index (VTI): 0.24                           cm^2/m^2: 0.37 cm2     _______________________________________________________________________ SV index(LVOT): 33.1 ml/m2  _______________________________________________________________________ Reading Physician:          MD Abran Richard, [phone] 12/08/2019 01:11 PM  Other Result Information  Interface, Rad Results In - 12/08/2019  1:12 PM EST Formatting of this note might be different from the original.                                  [place]                                Cardiac Ultrasound-                                 [facility], [place] [address]                   Transthoracic Echocardiogram Report Name: Andre Jimenez, LUETKEMEYER.              Study Date: 12/08/2019    Height: 67 in MRN: [id number]                                                        Weight: 210 lb DOB: 09-28-1941                           Gender: Male              BSA: 2.1 m2 Age: 71 yrs  Ethnicity: W              BP: 140/80 mmHg Reason For Study: Murmur; Essential hypertension; Heart murmur Ordering Physician: Abran Richard          Performed By: Minus Breeding Referring Physician: Abran Richard [place] Left ventricular systolic function is normal. LV ejection fraction = 55-60%. Diffuse thickening of the aortic valve with restricted cusp opening. There is moderate aortic stenosis. Aortic valve mean pressure gradient is 33 mmHg. There is mild aortic regurgitation. There is mild mitral regurgitation. No old echo for comparison - FINDINGS: LEFT VENTRICLE The left ventricular size is normal. There is borderline concentric left ventricular hypertrophy. LV ejection fraction = 55-60%. Left ventricular systolic function is normal. Left ventricular filling pattern is prolonged relaxation. The left ventricular wall motion is normal.  - RIGHT VENTRICLE The right ventricle is normal in size and function.  LEFT ATRIUM The left atrium is mildly dilated.  RIGHT ATRIUM Right atrial size is normal. - AORTIC VALVE Diffuse thickening of the aortic valve with restricted cusp opening. There is moderate aortic stenosis. Aortic valve mean pressure  gradient is 33 mmHg. There is mild aortic regurgitation. - MITRAL VALVE There is mild mitral annular calcification. There is mild mitral regurgitation. - TRICUSPID VALVE The tricuspid valve is normal in structure and function. No tricuspid regurgitation. - PULMONIC VALVE The pulmonic valve is normal in structure and function. - ARTERIES The aortic sinus is normal size. - VENOUS IVC size was normal. - EFFUSION There is no pericardial effusion. There is no pleural effusion. - -  MMode/2D Measurements & Calculations IVSd: 1.1 cm       LA dim: 4.5 cm     ESV(MOD-sp4):LVOT diam: 2.0 cm LVIDd: 5.4 cm      EDV(MOD-sp4):      64.7 ml LVPWd: 1.1 cm      147.0 ml LVIDs: 3.8 cm  _______________________________________________________________________ SV(MOD-sp4):       EF A4C: 56.0 %     LA ESV (BP): LA ESV Index (A2C): 82.3 ml                               73.2 ml      35.0 ml/m2 SI(MOD-sp4): 39.9 ml/m2  _______________________________________________________________________ LA ESV Index (A4C):LA ESV Index (BP): SV A4C: 34.8 ml/m2         35.5 ml/m2         82.3 ml  Time Measurements MM R-R int: 1.0 sec  Doppler Measurements & Calculations MV E max vel:     MV dec time:SV(LVOT): 68.3 ml     AI max vel: 114.0 cm/sec      0.27 sec    Ao V2 max:            372.6 cm/sec MV A max vel:                 372.9 cm/sec          AI dec slope: 125.6 cm/sec                  Ao max PG: 55.7 mmHg  216.5 cm/sec2 MV E/A: 0.91                  Ao V2 mean:           AI P1/2t: Med Peak E' Vel:  265.9 cm/sec          503.9 msec 3.7 cm/sec                    Ao mean PG: 31.5 mmHg Lat Peak E' Vel:              Ao V2 VTI: 90.2 cm 3.5 cm/sec                    AVA (VTI): 0.76 cm2 E/Lat E`: 32.7 E/Med E`: 30.8  _______________________________________________________________________ LV V1 VTI:        PA max PG:  AS Dimensionless      AVAi(VTI) 22.1 cm           2.6 mmHg    Index  (VTI): 0.24                                                     cm^2/m^2: 0.37 cm2  _______________________________________________________________________ SV index(LVOT): 33.1 ml/m2  _______________________________________________________________________ Reading Physician:                   MD Abran Richard, [phone] 12/08/2019 01:11 PM      CARDIAC CATHETERIZATION  Cath EF Quantitative 35 %  Other Result Information  This result has an attachment that is not available.  Result Narrative  PERFORMING CARDIOLOGIST: Bishop Limbo, DO [facility]   ACCESS SITE:  1. Right radial artery. 2. Right cephalic vein. 3. Right femoral artery (sheathless)  INDICATIONS: Angina and Valvular disease: AS  PROCEDURE: Coronary angiography, Left heart cath, LV gram, Right heart  cath  DIAGNOSTIC FINDINGS:   1. There is a focal severe stenosis of the mid LAD (abnormal by IFR). 2. There is a focal borderline stenosis of the second obtuse marginal. 3. Otherwise moderate, nonobstructive CAD. 4. Moderately reduced LV systolic function, EF [id number]. Global hypokinesis. 5. Severe aortic stenosis. Mean gradient 38 mmHg, aortic valve area 0.94  cm. 6. Mildly elevated LVEDP, 18 mmHg. 7. Mild pulmonary hypertension, 42/15 mmHg.    COMPLICATIONS: None  RECOMMENDATIONS:   Outpatient cardiothoracic surgery consult to discuss surgical aortic  valve replacement plus CABG versus transcatheter aortic valve replacement  plus stenting. In my opinion, either procedure would be reasonable for  this patient, who seems to be at intermediate risk for open heart surgery.  After we have input from cardiothoracic surgery, we will proceed with a  long-term plan to manage the patient's 3 cardiac issues: Heart failure,  aortic stenosis, and coronary artery disease.   Bishop Limbo, DO [facility] Interventional Cardiology  [facility] 01/12/2020 10:06 PM     STS Risk Calculator  Procedure:  AVR + CAB Risk of Mortality: 2.247% Renal Failure: 2.762% Permanent Stroke: 1.928% Prolonged Ventilation: 7.830% DSW Infection: 0.260% Reoperation: 2.910% Morbidity or Mortality: 12.972% Short Length of Stay: 31.701% Long Length of Stay: 6.837%    Impression:  Patient has multivessel coronary artery disease including high-grade 95% stenosis of the mid left anterior descending coronary artery and stage D severe symptomatic aortic stenosis.  He describes a long history of slowly progressive symptoms of exertional shortness of breath with more recent symptoms of exertional chest pain consistent with stable exertional angina, CCS class II-III, and chronic diastolic congestive heart failure New York Heart Association functional class II.  I have  personally reviewed the patient's recent transthoracic echocardiogram and diagnostic cardiac catheterization.  Echocardiogram reveals severe aortic stenosis with normal left ventricular systolic function.  The aortic valve appears trileaflet with severe thickening, calcification, and restricted leaflet mobility involving all 3 leaflets.  Peak velocity across aortic valve measured 3.8 m/s corresponding to mean transvalvular gradient estimated 38 mmHg with DVI reported 0.24 and aortic valve area calculated only 0.76 cm.  Diagnostic cardiac catheterization confirmed the presence of severe aortic stenosis with mean transvalvular gradient measured 38 mmHg at catheterization, corresponding to aortic valve area calculated 0.94 cm.  There was mild pulmonary hypertension.  Coronary angiography revealed multivessel coronary artery disease with high-grade 95% discrete stenosis of the mid left anterior descending coronary artery.  There was a tubular 70% stenosis of the proximal portion of a large second obtuse marginal branch of left circumflex coronary artery.  By report baseline twelve-lead EKG reveals no significant AV conduction delay.  I agree the patient  needs definitive treatment for both severe aortic stenosis and symptomatic coronary artery disease.  Despite the patient's advanced age risks associated with conventional surgical aortic valve replacement and coronary artery bypass grafting would be relatively low.  I suspect the patient would do well with conventional surgery.  However, it would be reasonable to consider PCI and stenting of the left anterior descending coronary artery with transcatheter aortic valve replacement as a less invasive alternative if CT angiography were to reveal anatomical characteristics suitable for transcatheter aortic valve replacement without any complicating features.   Plan:  The patient and his wife were counseled at length regarding treatment alternatives for management of severe symptomatic aortic stenosis and multivessel coronary artery disease.  Alternative approaches such as conventional aortic valve replacement with coronary artery bypass grafting, transcatheter aortic valve replacement with PCI and stenting of the LAD coronary artery, and continued medical therapy without intervention were compared and contrasted at length.  The risks associated with conventional surgery were discussed in detail, as were expectations for post-operative convalescence.  Issues specific to PCI and transcatheter aortic valve replacement were discussed including questions about long term stent and valve durability, the potential for recurrent ischemic heart disease or paravalvular leak, possible increased risk of need for permanent pacemaker placement, and other technical complications related to the TAVR procedure itself.  Long-term prognosis with medical therapy was discussed. This discussion was placed in the context of the patient's own specific clinical presentation and past medical history.  All of their questions have been addressed.  The patient is interested in proceeding with CT angiography to further evaluate the feasibility of  transcatheter aortic valve replacement as a less invasive alternative to conventional surgery.  The patient will return to our office within 1 week following CT angiography to discuss treatment alternatives further.  He has been strongly advised to avoid any sort of strenuous physical exertion during the interim period of time.  We have not recommended any changes to the patient's current medications.    I spent in excess of 90 minutes during the conduct of this office consultation and >50% of this time involved direct face-to-face encounter with the patient for counseling and/or coordination of their care.     Valentina Gu. Roxy Manns, MD 01/15/2020 9:27 AM

## 2020-01-15 NOTE — Patient Instructions (Addendum)
Continue all previous medications without any changes at this time  Avoid strenuous activity of any kind and call your cardiologist or go directly to Emergency Room if you develop chest pain unrelieved by rest

## 2020-01-22 ENCOUNTER — Ambulatory Visit (HOSPITAL_COMMUNITY)
Admission: RE | Admit: 2020-01-22 | Discharge: 2020-01-22 | Disposition: A | Discharge status: Home / Self Care | Payer: Medicare Other | Source: Ambulatory Visit | Attending: Thoracic Surgery (Cardiothoracic Vascular Surgery) | Admitting: Thoracic Surgery (Cardiothoracic Vascular Surgery)

## 2020-01-22 ENCOUNTER — Ambulatory Visit (HOSPITAL_COMMUNITY): Payer: Medicare Other

## 2020-01-22 ENCOUNTER — Other Ambulatory Visit: Payer: Self-pay

## 2020-01-22 DIAGNOSIS — I350 Nonrheumatic aortic (valve) stenosis: Secondary | ICD-10-CM | POA: Diagnosis not present

## 2020-01-22 MED ORDER — IOHEXOL 350 MG/ML SOLN
100.0000 mL | Freq: Once | INTRAVENOUS | Status: AC | PRN
  Administered 2020-01-22: 100 mL via INTRAVENOUS

## 2020-01-25 ENCOUNTER — Other Ambulatory Visit: Payer: Medicare Other | Admitting: *Deleted

## 2020-01-25 ENCOUNTER — Other Ambulatory Visit: Payer: Self-pay

## 2020-01-25 ENCOUNTER — Encounter: Payer: Self-pay | Admitting: Thoracic Surgery (Cardiothoracic Vascular Surgery)

## 2020-01-25 ENCOUNTER — Ambulatory Visit (INDEPENDENT_AMBULATORY_CARE_PROVIDER_SITE_OTHER): Payer: Medicare Other | Admitting: Thoracic Surgery (Cardiothoracic Vascular Surgery)

## 2020-01-25 ENCOUNTER — Other Ambulatory Visit (HOSPITAL_COMMUNITY)
Admission: RE | Admit: 2020-01-25 | Discharge: 2020-01-25 | Disposition: A | Discharge status: Home / Self Care | Payer: Medicare Other | Source: Ambulatory Visit | Attending: Cardiovascular Disease | Admitting: Cardiovascular Disease

## 2020-01-25 VITALS — BP 130/71 | HR 59 | Temp 97.6°F | Resp 20 | Ht 67.0 in | Wt 204.0 lb

## 2020-01-25 DIAGNOSIS — Z01812 Encounter for preprocedural laboratory examination: Secondary | ICD-10-CM | POA: Diagnosis present

## 2020-01-25 DIAGNOSIS — I25118 Atherosclerotic heart disease of native coronary artery with other forms of angina pectoris: Secondary | ICD-10-CM

## 2020-01-25 DIAGNOSIS — I350 Nonrheumatic aortic (valve) stenosis: Secondary | ICD-10-CM | POA: Diagnosis not present

## 2020-01-25 DIAGNOSIS — Z20822 Contact with and (suspected) exposure to covid-19: Secondary | ICD-10-CM | POA: Diagnosis not present

## 2020-01-25 DIAGNOSIS — I25119 Atherosclerotic heart disease of native coronary artery with unspecified angina pectoris: Secondary | ICD-10-CM

## 2020-01-25 LAB — BASIC METABOLIC PANEL
BUN/Creatinine Ratio: 13 (ref 10–24)
BUN: 17 mg/dL (ref 8–27)
CO2: 25 mmol/L (ref 20–29)
Calcium: 9.2 mg/dL (ref 8.6–10.2)
Chloride: 103 mmol/L (ref 96–106)
Creatinine, Ser: 1.27 mg/dL (ref 0.76–1.27)
GFR calc Af Amer: 63 mL/min/{1.73_m2} (ref >59)
GFR calc non Af Amer: 54 mL/min/{1.73_m2} — ABNORMAL LOW (ref >59)
Glucose: 109 mg/dL — ABNORMAL HIGH (ref 65–99)
Potassium: 4.1 mmol/L (ref 3.5–5.2)
Sodium: 140 mmol/L (ref 134–144)

## 2020-01-25 LAB — CBC
Hematocrit: 39.5 % (ref 37.5–51.0)
Hemoglobin: 13.4 g/dL (ref 13.0–17.7)
MCH: 29.5 pg (ref 26.6–33.0)
MCHC: 33.9 g/dL (ref 31.5–35.7)
MCV: 87 fL (ref 79–97)
Platelets: 314 10*3/uL (ref 150–450)
RBC: 4.55 x10E6/uL (ref 4.14–5.80)
RDW: 13.1 % (ref 11.6–15.4)
WBC: 7 10*3/uL (ref 3.4–10.8)

## 2020-01-25 LAB — SARS CORONAVIRUS 2 (TAT 6-24 HRS): SARS Coronavirus 2: NEGATIVE

## 2020-01-25 MED ORDER — CLOPIDOGREL BISULFATE 75 MG PO TABS
75.0000 mg | ORAL_TABLET | Freq: Every day | ORAL | 3 refills | Status: DC
Start: 2020-01-25 — End: 2020-12-22

## 2020-01-25 MED ORDER — PANTOPRAZOLE SODIUM 40 MG PO TBEC
40.0000 mg | DELAYED_RELEASE_TABLET | Freq: Every day | ORAL | 3 refills | Status: DC
Start: 2020-01-25 — End: 2023-07-14

## 2020-01-25 NOTE — Patient Instructions (Signed)
Continue all previous medications without any changes at this time  

## 2020-01-25 NOTE — H&P (View-Only) (Signed)
[address]       [place] [address]             [phone]     CARDIOTHORACIC SURGERY OFFICE NOTE  Referring Provider is Bishop Limbo, DO   Primary Cardiologist is Flossie Buffy., MD PCP is Myrlene Broker, MD   HPI:  Patient is a 79 year old male with history of aortic stenosis, coronary artery disease, hypertension, hyperlipidemia, and type 2 diabetes mellitus who returns to the office today to further discuss treatment options for management of severe symptomatic aortic stenosis and coronary artery disease with exertional angina.  He was initially seen in the office for consultation on January 15, 2020.  Since then he underwent CT angiography on January 22, 2020.  He reports no new problems or complaints over the past 10 days.  He states that he has been taking it easy around the house and not trying to do anything too strenuous.  He still gets short of breath with exertion and he will get tightness across his chest if he tries to do anything strenuous.   Current Outpatient Medications  Medication Sig Dispense Refill  . amLODipine (NORVASC) 10 MG tablet Take 10 mg by mouth daily.    Marland Kitchen aspirin EC 81 MG tablet Take 81 mg by mouth daily.    Marland Kitchen atorvastatin (LIPITOR) 10 MG tablet Take 10 mg by mouth daily.    Marland Kitchen glipiZIDE (GLUCOTROL XL) 5 MG 24 hr tablet Take 5 mg by mouth in the morning and at bedtime.    . metFORMIN (GLUCOPHAGE) 1000 MG tablet Take 1,000 mg by mouth 2 (two) times daily with a meal.    . omeprazole (PRILOSEC) 40 MG capsule Take 40 mg by mouth daily.    Marland Kitchen telmisartan-hydrochlorothiazide (MICARDIS HCT) 40-12.5 MG tablet Take 1 tablet by mouth daily.     No current facility-administered medications for this visit.      Physical Exam:   BP 130/71   Pulse (!) 59   Temp 97.6 F (36.4 C) (Skin)   Resp 20   Ht 5\' 7"  (1.702 m)   Wt 204 lb (92.5 kg)   SpO2 98% Comment: RA  BMI 31.95 kg/m   General:  Well-appearing  Chest:   Clear to  auscultation  CV:   Regular rate and rhythm with systolic murmur  Incisions:  n/a  Abdomen:  Soft nontender  Extremities:  Warm and well-perfused  Diagnostic Tests:  Cardiac TAVR CT  TECHNIQUE: The patient was scanned on a Siemens Force [id number] slice scanner. A 120 kV retrospective scan was triggered in the descending thoracic aorta at 111 HU's. Gantry rotation speed was 270 msecs and collimation was .9 mm. No beta blockade or nitro were given. The 3D data set was reconstructed in 5% intervals of the R-R cycle. Systolic and diastolic phases were analyzed on a dedicated work station using MPR, MIP and VRT modes. The patient received 155mL OMNIPAQUE IOHEXOL 350 MG/ML SOLN of contrast.  FINDINGS: Suboptimal timing of contrast opacification.  Aortic Valve: Trileaflet aortic valve. Severely reduced cusp separation. Moderately thickened, severely calcified aortic valve cusps.  AV calcium score: 2017  Virtual Basal Annulus Measurements:  Maximum/Minimum Diameter: 25.4 x 22.1 mm  Perimeter: 73.9 mm  Area: 418 mm2  No significant LVOT calcifications.  Based on these measurements, the annulus would be suitable for a 23 mm Sapien 3 valve.  Sinus of Valsalva Measurements:  Non-coronary:  30 mm  Right -  coronary:  30 mm  Left - coronary:  31 mm  Sinus of Valsalva Height:  Left: 14.5 mm  Right: 18.1 mm  Aorta: Severe calcifications. Conventional 3 vessel branch pattern of aortic arch.  Sinotubular Junction:  27 mm  Ascending Thoracic Aorta:  35 mm  Aortic Arch:  28 mm  Descending Thoracic Aorta:  24 mm  Coronary Artery Height above Annulus:  Left Main: 11.3 mm  Right Coronary: 14.3 mm  Coronary Arteries: 3 vessel coronary artery calcifications  Optimum Fluoroscopic Angle for Delivery: RAO 1, CRA 1  No left atrial appendage thrombus.  IMPRESSION: 1. Aortic Valve: Trileaflet aortic valve. Severely reduced cusp separation.  Moderately thickened, severely calcified aortic valve cusps.  2.  AV calcium score: 2017  3.  Annulus Area: 418 mm2, suitable for 23 mm Sapien 3 valve  4.  Adequate coronary artery height from annulus  5. Optimum Fluoroscopic Angle for Delivery: RAO 1, CRA 1   Electronically Signed   By: Cherlynn Kaiser   On: 01/24/2020 21:41   CT ANGIOGRAPHY CHEST, ABDOMEN AND PELVIS  TECHNIQUE: Non-contrast CT of the chest was initially obtained.  Multidetector CT imaging through the chest, abdomen and pelvis was performed using the standard protocol during bolus administration of intravenous contrast. Multiplanar reconstructed images and MIPs were obtained and reviewed to evaluate the vascular anatomy.  CONTRAST:  170mL OMNIPAQUE IOHEXOL 350 MG/ML SOLN  COMPARISON:  None.  FINDINGS: CTA CHEST FINDINGS  Cardiovascular: Heart size is normal. There is no significant pericardial fluid, thickening or pericardial calcification. There is aortic atherosclerosis, as well as atherosclerosis of the great vessels of the mediastinum and the coronary arteries, including calcified atherosclerotic plaque in the left main, left anterior descending, left circumflex and right coronary arteries. Severe thickening calcification of the aortic valve.  Mediastinum/Lymph Nodes: No pathologically enlarged mediastinal or hilar lymph nodes. Esophagus is unremarkable in appearance. No axillary lymphadenopathy.  Lungs/Pleura: No suspicious appearing pulmonary nodules or masses are noted. No acute consolidative airspace disease. No pleural effusions.  Musculoskeletal/Soft Tissues: Multiple old healed and healing fractures of the right ribs laterally and posterolaterally. Orthopedic fixation hardware noted in the lower cervical spine. There are no aggressive appearing lytic or blastic lesions noted in the visualized portions of the skeleton.  CTA ABDOMEN AND PELVIS  FINDINGS  Hepatobiliary: No suspicious cystic or solid hepatic lesions. Small calcified granulomas in the right lobe of the liver. No intra or extrahepatic biliary ductal dilatation. Amorphous intermediate to high attenuation material lying dependently in the gallbladder may reflect tiny gallstones and/or biliary sludge. No findings to suggest an acute cholecystitis at this time.  Pancreas: No pancreatic mass. No pancreatic ductal dilatation. No pancreatic or peripancreatic fluid collections or inflammatory changes.  Spleen: Unremarkable.  Adrenals/Urinary Tract: Multifocal cortical thinning in the kidneys bilaterally. No suspicious renal lesions. No hydroureteronephrosis. Bilateral adrenal glands are normal in appearance. Urinary bladder is normal in appearance.  Stomach/Bowel: Normal appearance of the stomach. No pathologic dilatation of small bowel or colon. The appendix is not confidently identified and may be surgically absent. Regardless, there are no inflammatory changes noted adjacent to the cecum to suggest the presence of an acute appendicitis at this time.  Vascular/Lymphatic: Aortic atherosclerosis, with vascular findings and measurements pertinent to potential TAVR procedure, as detailed below. No aneurysm or dissection noted in the abdominal or pelvic vasculature. No lymphadenopathy noted in the abdomen or pelvis.  Reproductive: Prostate gland and seminal vesicles are unremarkable in appearance.  Other: No significant volume  of ascites.  No pneumoperitoneum.  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 There are no aggressive appearing lytic or blastic lesions noted in the visualized portions of the skeleton.  VASCULAR MEASUREMENTS PERTINENT TO TAVR:  AORTA:  Minimal Aortic Diameter-11 x 11 mm  Severity of Aortic Calcification-severe  RIGHT PELVIS:  Right Common Iliac Artery -  Minimal  Diameter-6.9 x 5.9 mm  Tortuosity-mild  Calcification-moderate  Right External Iliac Artery -  Minimal Diameter-9.0 x 8.8 mm  Tortuosity-moderate  Calcification-none  Right Common Femoral Artery -  Minimal Diameter-9.1 x 7.2 mm  Tortuosity-mild  Calcification-mild  LEFT PELVIS:  Left Common Iliac Artery -  Minimal Diameter-9.1 x 7.3 mm  Tortuosity-mild  Calcification-moderate to severe  Left External Iliac Artery -  Minimal Diameter-9.2 x 8.6 mm  Tortuosity-moderate  Calcification-minimal  Left Common Femoral Artery -  Minimal Diameter-9.1 x 7.4 mm  Tortuosity-mild  Calcification-mild  Review of the MIP images confirms the above findings.  IMPRESSION: 1. Vascular findings and measurements pertinent to potential TAVR procedure, as detailed above. 2. Severe thickening calcification of the aortic valve, compatible with reported clinical history of severe aortic stenosis. 3. Aortic atherosclerosis, in addition to left main and 3 vessel coronary artery disease. Assessment for potential risk factor modification, dietary therapy or pharmacologic therapy may be warranted, if clinically indicated. 4. Multiple old healed and healing right-sided rib fractures laterally and posterolaterally. 5. Small amount of biliary sludge and/or gallstones lying dependently in the gallbladder. No findings to suggest an acute cholecystitis at this time. 6. Additional incidental findings, as above.   Electronically Signed   By: Vinnie Langton M.D.   On: 01/22/2020 14:04   Impression:  Patient has multivessel coronary artery disease including high-grade 95% stenosis of the mid left anterior descending coronary artery and stage D severe symptomatic aortic stenosis.  He describes a long history of slowly progressive symptoms of exertional shortness of breath with more recent symptoms of exertional chest pain consistent with stable exertional  angina, CCS class II-III, and chronic diastolic congestive heart failure [place] Heart Association functional class II.  I have personally reviewed the patient's recent transthoracic echocardiogram, diagnostic cardiac catheterization and CT angiograms.  Echocardiogram reveals severe aortic stenosis with normal left ventricular systolic function.  The aortic valve appears trileaflet with severe thickening, calcification, and restricted leaflet mobility involving all 3 leaflets.  Peak velocity across aortic valve measured 3.8 m/s corresponding to mean transvalvular gradient estimated 38 mmHg with DVI reported 0.24 and aortic valve area calculated only 0.76 cm.  Diagnostic cardiac catheterization confirmed the presence of severe aortic stenosis with mean transvalvular gradient measured 38 mmHg at catheterization, corresponding to aortic valve area calculated 0.94 cm.  There was mild pulmonary hypertension.  Coronary angiography revealed multivessel coronary artery disease with high-grade 95% discrete stenosis of the mid left anterior descending coronary artery.  There was a tubular 70% stenosis of the proximal portion of a large second obtuse marginal branch of left circumflex coronary artery.  By report baseline twelve-lead EKG reveals no significant AV conduction delay.  Cardiac gated CT angiogram of the heart reveals the presence of a trileaflet aortic valve with severe calcification, reduced cusp separation and moderate leaflet thickening consistent with severe aortic stenosis.  Calcium score was 2017.  There was adequate coronary height and no significant annular calcification nor calcification extending into the left ventricular outflow tract.  The annulus area was relatively small, measuring 418 mm.  CT angiography of the  aorta and iliac vessels demonstrates moderately severe calcification and atherosclerotic disease involving bilateral common iliac arteries, more so on the right side than the left.   However, there appears to be adequate lumen size for transfemoral access on the left side.  I agree the patient needs definitive treatment for both severe aortic stenosis and symptomatic coronary artery disease.  Despite the patient's advanced age risks associated with conventional surgical aortic valve replacement and coronary artery bypass grafting would be relatively low, although the patient might require aortic root enlargement in order to avoid the development of patient prosthesis mismatch following surgical aortic valve replacement using a stented bioprosthetic tissue valve.    Under the circumstances it seems reasonable to consider PCI and stenting of the left anterior descending coronary artery with transcatheter aortic valve replacement as a less invasive alternative given that CT angiography reveals anatomical characteristics suitable for transcatheter aortic valve replacement without any complicating features.     Plan:  The patient and his wife were again counseled at length regarding treatment alternatives for management of severe symptomatic aortic stenosis and multivessel coronary artery disease.  Alternative approaches such as conventional aortic valve replacement with coronary artery bypass grafting, transcatheter aortic valve replacement with PCI and stenting of the LAD coronary artery, and continued medical therapy without intervention were compared and contrasted at length.  The risks associated with conventional surgery were discussed in detail, as were expectations for post-operative convalescence.  Issues specific to PCI and transcatheter aortic valve replacement were discussed including questions about long term stent and valve durability, the potential for recurrent ischemic heart disease or paravalvular leak, possible increased risk of need for permanent pacemaker placement, and other technical complications related to the TAVR procedure itself.  Long-term prognosis with medical  therapy was discussed. This discussion was placed in the context of the patient's own specific clinical presentation and past medical history.  All of their questions have been addressed.  The patient hopes to proceed with PCI and stenting of the left anterior descending coronary artery followed by transcatheter aortic valve replacement in the near future.    I spent in excess of 15 minutes during the conduct of this office consultation and >50% of this time involved direct face-to-face encounter with the patient for counseling and/or coordination of their care.   Valentina Gu. Roxy Manns, MD 01/25/2020 9:41 AM

## 2020-01-25 NOTE — Progress Notes (Signed)
[address]       [place] [address]             [phone]     CARDIOTHORACIC SURGERY OFFICE NOTE  Referring Provider is Bishop Limbo, DO   Primary Cardiologist is Flossie Buffy., MD PCP is Myrlene Broker, MD   HPI:  Patient is a 79 year old male with history of aortic stenosis, coronary artery disease, hypertension, hyperlipidemia, and type 2 diabetes mellitus who returns to the office today to further discuss treatment options for management of severe symptomatic aortic stenosis and coronary artery disease with exertional angina.  He was initially seen in the office for consultation on January 15, 2020.  Since then he underwent CT angiography on January 22, 2020.  He reports no new problems or complaints over the past 10 days.  He states that he has been taking it easy around the house and not trying to do anything too strenuous.  He still gets short of breath with exertion and he will get tightness across his chest if he tries to do anything strenuous.   Current Outpatient Medications  Medication Sig Dispense Refill  . amLODipine (NORVASC) 10 MG tablet Take 10 mg by mouth daily.    Marland Kitchen aspirin EC 81 MG tablet Take 81 mg by mouth daily.    Marland Kitchen atorvastatin (LIPITOR) 10 MG tablet Take 10 mg by mouth daily.    Marland Kitchen glipiZIDE (GLUCOTROL XL) 5 MG 24 hr tablet Take 5 mg by mouth in the morning and at bedtime.    . metFORMIN (GLUCOPHAGE) 1000 MG tablet Take 1,000 mg by mouth 2 (two) times daily with a meal.    . omeprazole (PRILOSEC) 40 MG capsule Take 40 mg by mouth daily.    Marland Kitchen telmisartan-hydrochlorothiazide (MICARDIS HCT) 40-12.5 MG tablet Take 1 tablet by mouth daily.     No current facility-administered medications for this visit.      Physical Exam:   BP 130/71   Pulse (!) 59   Temp 97.6 F (36.4 C) (Skin)   Resp 20   Ht 5\' 7"  (1.702 m)   Wt 204 lb (92.5 kg)   SpO2 98% Comment: RA  BMI 31.95 kg/m   General:  Well-appearing  Chest:   Clear to  auscultation  CV:   Regular rate and rhythm with systolic murmur  Incisions:  n/a  Abdomen:  Soft nontender  Extremities:  Warm and well-perfused  Diagnostic Tests:  Cardiac TAVR CT  TECHNIQUE: The patient was scanned on a Siemens Force [id number] slice scanner. A 120 kV retrospective scan was triggered in the descending thoracic aorta at 111 HU's. Gantry rotation speed was 270 msecs and collimation was .9 mm. No beta blockade or nitro were given. The 3D data set was reconstructed in 5% intervals of the R-R cycle. Systolic and diastolic phases were analyzed on a dedicated work station using MPR, MIP and VRT modes. The patient received 156mL OMNIPAQUE IOHEXOL 350 MG/ML SOLN of contrast.  FINDINGS: Suboptimal timing of contrast opacification.  Aortic Valve: Trileaflet aortic valve. Severely reduced cusp separation. Moderately thickened, severely calcified aortic valve cusps.  AV calcium score: 2017  Virtual Basal Annulus Measurements:  Maximum/Minimum Diameter: 25.4 x 22.1 mm  Perimeter: 73.9 mm  Area: 418 mm2  No significant LVOT calcifications.  Based on these measurements, the annulus would be suitable for a 23 mm Sapien 3 valve.  Sinus of Valsalva Measurements:  Non-coronary:  30 mm  Right -  coronary:  30 mm  Left - coronary:  31 mm  Sinus of Valsalva Height:  Left: 14.5 mm  Right: 18.1 mm  Aorta: Severe calcifications. Conventional 3 vessel branch pattern of aortic arch.  Sinotubular Junction:  27 mm  Ascending Thoracic Aorta:  35 mm  Aortic Arch:  28 mm  Descending Thoracic Aorta:  24 mm  Coronary Artery Height above Annulus:  Left Main: 11.3 mm  Right Coronary: 14.3 mm  Coronary Arteries: 3 vessel coronary artery calcifications  Optimum Fluoroscopic Angle for Delivery: RAO 1, CRA 1  No left atrial appendage thrombus.  IMPRESSION: 1. Aortic Valve: Trileaflet aortic valve. Severely reduced cusp separation.  Moderately thickened, severely calcified aortic valve cusps.  2.  AV calcium score: 2017  3.  Annulus Area: 418 mm2, suitable for 23 mm Sapien 3 valve  4.  Adequate coronary artery height from annulus  5. Optimum Fluoroscopic Angle for Delivery: RAO 1, CRA 1   Electronically Signed   By: Cherlynn Kaiser   On: 01/24/2020 21:41   CT ANGIOGRAPHY CHEST, ABDOMEN AND PELVIS  TECHNIQUE: Non-contrast CT of the chest was initially obtained.  Multidetector CT imaging through the chest, abdomen and pelvis was performed using the standard protocol during bolus administration of intravenous contrast. Multiplanar reconstructed images and MIPs were obtained and reviewed to evaluate the vascular anatomy.  CONTRAST:  155mL OMNIPAQUE IOHEXOL 350 MG/ML SOLN  COMPARISON:  None.  FINDINGS: CTA CHEST FINDINGS  Cardiovascular: Heart size is normal. There is no significant pericardial fluid, thickening or pericardial calcification. There is aortic atherosclerosis, as well as atherosclerosis of the great vessels of the mediastinum and the coronary arteries, including calcified atherosclerotic plaque in the left main, left anterior descending, left circumflex and right coronary arteries. Severe thickening calcification of the aortic valve.  Mediastinum/Lymph Nodes: No pathologically enlarged mediastinal or hilar lymph nodes. Esophagus is unremarkable in appearance. No axillary lymphadenopathy.  Lungs/Pleura: No suspicious appearing pulmonary nodules or masses are noted. No acute consolidative airspace disease. No pleural effusions.  Musculoskeletal/Soft Tissues: Multiple old healed and healing fractures of the right ribs laterally and posterolaterally. Orthopedic fixation hardware noted in the lower cervical spine. There are no aggressive appearing lytic or blastic lesions noted in the visualized portions of the skeleton.  CTA ABDOMEN AND PELVIS  FINDINGS  Hepatobiliary: No suspicious cystic or solid hepatic lesions. Small calcified granulomas in the right lobe of the liver. No intra or extrahepatic biliary ductal dilatation. Amorphous intermediate to high attenuation material lying dependently in the gallbladder may reflect tiny gallstones and/or biliary sludge. No findings to suggest an acute cholecystitis at this time.  Pancreas: No pancreatic mass. No pancreatic ductal dilatation. No pancreatic or peripancreatic fluid collections or inflammatory changes.  Spleen: Unremarkable.  Adrenals/Urinary Tract: Multifocal cortical thinning in the kidneys bilaterally. No suspicious renal lesions. No hydroureteronephrosis. Bilateral adrenal glands are normal in appearance. Urinary bladder is normal in appearance.  Stomach/Bowel: Normal appearance of the stomach. No pathologic dilatation of small bowel or colon. The appendix is not confidently identified and may be surgically absent. Regardless, there are no inflammatory changes noted adjacent to the cecum to suggest the presence of an acute appendicitis at this time.  Vascular/Lymphatic: Aortic atherosclerosis, with vascular findings and measurements pertinent to potential TAVR procedure, as detailed below. No aneurysm or dissection noted in the abdominal or pelvic vasculature. No lymphadenopathy noted in the abdomen or pelvis.  Reproductive: Prostate gland and seminal vesicles are unremarkable in appearance.  Other: No significant volume  of ascites.  No pneumoperitoneum.  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 There are no aggressive appearing lytic or blastic lesions noted in the visualized portions of the skeleton.  VASCULAR MEASUREMENTS PERTINENT TO TAVR:  AORTA:  Minimal Aortic Diameter-11 x 11 mm  Severity of Aortic Calcification-severe  RIGHT PELVIS:  Right Common Iliac Artery -  Minimal  Diameter-6.9 x 5.9 mm  Tortuosity-mild  Calcification-moderate  Right External Iliac Artery -  Minimal Diameter-9.0 x 8.8 mm  Tortuosity-moderate  Calcification-none  Right Common Femoral Artery -  Minimal Diameter-9.1 x 7.2 mm  Tortuosity-mild  Calcification-mild  LEFT PELVIS:  Left Common Iliac Artery -  Minimal Diameter-9.1 x 7.3 mm  Tortuosity-mild  Calcification-moderate to severe  Left External Iliac Artery -  Minimal Diameter-9.2 x 8.6 mm  Tortuosity-moderate  Calcification-minimal  Left Common Femoral Artery -  Minimal Diameter-9.1 x 7.4 mm  Tortuosity-mild  Calcification-mild  Review of the MIP images confirms the above findings.  IMPRESSION: 1. Vascular findings and measurements pertinent to potential TAVR procedure, as detailed above. 2. Severe thickening calcification of the aortic valve, compatible with reported clinical history of severe aortic stenosis. 3. Aortic atherosclerosis, in addition to left main and 3 vessel coronary artery disease. Assessment for potential risk factor modification, dietary therapy or pharmacologic therapy may be warranted, if clinically indicated. 4. Multiple old healed and healing right-sided rib fractures laterally and posterolaterally. 5. Small amount of biliary sludge and/or gallstones lying dependently in the gallbladder. No findings to suggest an acute cholecystitis at this time. 6. Additional incidental findings, as above.   Electronically Signed   By: Vinnie Langton M.D.   On: 01/22/2020 14:04   Impression:  Patient has multivessel coronary artery disease including high-grade 95% stenosis of the mid left anterior descending coronary artery and stage D severe symptomatic aortic stenosis.  He describes a long history of slowly progressive symptoms of exertional shortness of breath with more recent symptoms of exertional chest pain consistent with stable exertional  angina, CCS class II-III, and chronic diastolic congestive heart failure New York Heart Association functional class II.  I have personally reviewed the patient's recent transthoracic echocardiogram, diagnostic cardiac catheterization and CT angiograms.  Echocardiogram reveals severe aortic stenosis with normal left ventricular systolic function.  The aortic valve appears trileaflet with severe thickening, calcification, and restricted leaflet mobility involving all 3 leaflets.  Peak velocity across aortic valve measured 3.8 m/s corresponding to mean transvalvular gradient estimated 38 mmHg with DVI reported 0.24 and aortic valve area calculated only 0.76 cm.  Diagnostic cardiac catheterization confirmed the presence of severe aortic stenosis with mean transvalvular gradient measured 38 mmHg at catheterization, corresponding to aortic valve area calculated 0.94 cm.  There was mild pulmonary hypertension.  Coronary angiography revealed multivessel coronary artery disease with high-grade 95% discrete stenosis of the mid left anterior descending coronary artery.  There was a tubular 70% stenosis of the proximal portion of a large second obtuse marginal branch of left circumflex coronary artery.  By report baseline twelve-lead EKG reveals no significant AV conduction delay.  Cardiac gated CT angiogram of the heart reveals the presence of a trileaflet aortic valve with severe calcification, reduced cusp separation and moderate leaflet thickening consistent with severe aortic stenosis.  Calcium score was 2017.  There was adequate coronary height and no significant annular calcification nor calcification extending into the left ventricular outflow tract.  The annulus area was relatively small, measuring 418 mm.  CT angiography of the  aorta and iliac vessels demonstrates moderately severe calcification and atherosclerotic disease involving bilateral common iliac arteries, more so on the right side than the left.   However, there appears to be adequate lumen size for transfemoral access on the left side.  I agree the patient needs definitive treatment for both severe aortic stenosis and symptomatic coronary artery disease.  Despite the patient's advanced age risks associated with conventional surgical aortic valve replacement and coronary artery bypass grafting would be relatively low, although the patient might require aortic root enlargement in order to avoid the development of patient prosthesis mismatch following surgical aortic valve replacement using a stented bioprosthetic tissue valve.    Under the circumstances it seems reasonable to consider PCI and stenting of the left anterior descending coronary artery with transcatheter aortic valve replacement as a less invasive alternative given that CT angiography reveals anatomical characteristics suitable for transcatheter aortic valve replacement without any complicating features.     Plan:  The patient and his wife were again counseled at length regarding treatment alternatives for management of severe symptomatic aortic stenosis and multivessel coronary artery disease.  Alternative approaches such as conventional aortic valve replacement with coronary artery bypass grafting, transcatheter aortic valve replacement with PCI and stenting of the LAD coronary artery, and continued medical therapy without intervention were compared and contrasted at length.  The risks associated with conventional surgery were discussed in detail, as were expectations for post-operative convalescence.  Issues specific to PCI and transcatheter aortic valve replacement were discussed including questions about long term stent and valve durability, the potential for recurrent ischemic heart disease or paravalvular leak, possible increased risk of need for permanent pacemaker placement, and other technical complications related to the TAVR procedure itself.  Long-term prognosis with medical  therapy was discussed. This discussion was placed in the context of the patient's own specific clinical presentation and past medical history.  All of their questions have been addressed.  The patient hopes to proceed with PCI and stenting of the left anterior descending coronary artery followed by transcatheter aortic valve replacement in the near future.    I spent in excess of 15 minutes during the conduct of this office consultation and >50% of this time involved direct face-to-face encounter with the patient for counseling and/or coordination of their care.   Valentina Gu. Roxy Manns, MD 01/25/2020 9:41 AM

## 2020-01-25 NOTE — H&P (View-Only) (Signed)
[address]       [place] [address]             [phone]     CARDIOTHORACIC SURGERY OFFICE NOTE  Referring Provider is Bishop Limbo, DO   Primary Cardiologist is Flossie Buffy., MD PCP is Myrlene Broker, MD   HPI:  Patient is a 79 year old male with history of aortic stenosis, coronary artery disease, hypertension, hyperlipidemia, and type 2 diabetes mellitus who returns to the office today to further discuss treatment options for management of severe symptomatic aortic stenosis and coronary artery disease with exertional angina.  He was initially seen in the office for consultation on January 15, 2020.  Since then he underwent CT angiography on January 22, 2020.  He reports no new problems or complaints over the past 10 days.  He states that he has been taking it easy around the house and not trying to do anything too strenuous.  He still gets short of breath with exertion and he will get tightness across his chest if he tries to do anything strenuous.   Current Outpatient Medications  Medication Sig Dispense Refill  . amLODipine (NORVASC) 10 MG tablet Take 10 mg by mouth daily.    Marland Kitchen aspirin EC 81 MG tablet Take 81 mg by mouth daily.    Marland Kitchen atorvastatin (LIPITOR) 10 MG tablet Take 10 mg by mouth daily.    Marland Kitchen glipiZIDE (GLUCOTROL XL) 5 MG 24 hr tablet Take 5 mg by mouth in the morning and at bedtime.    . metFORMIN (GLUCOPHAGE) 1000 MG tablet Take 1,000 mg by mouth 2 (two) times daily with a meal.    . omeprazole (PRILOSEC) 40 MG capsule Take 40 mg by mouth daily.    Marland Kitchen telmisartan-hydrochlorothiazide (MICARDIS HCT) 40-12.5 MG tablet Take 1 tablet by mouth daily.     No current facility-administered medications for this visit.      Physical Exam:   BP 130/71   Pulse (!) 59   Temp 97.6 F (36.4 C) (Skin)   Resp 20   Ht 5\' 7"  (1.702 m)   Wt 204 lb (92.5 kg)   SpO2 98% Comment: RA  BMI 31.95 kg/m   General:  Well-appearing  Chest:   Clear to  auscultation  CV:   Regular rate and rhythm with systolic murmur  Incisions:  n/a  Abdomen:  Soft nontender  Extremities:  Warm and well-perfused  Diagnostic Tests:  Cardiac TAVR CT  TECHNIQUE: The patient was scanned on a Siemens Force [id number] slice scanner. A 120 kV retrospective scan was triggered in the descending thoracic aorta at 111 HU's. Gantry rotation speed was 270 msecs and collimation was .9 mm. No beta blockade or nitro were given. The 3D data set was reconstructed in 5% intervals of the R-R cycle. Systolic and diastolic phases were analyzed on a dedicated work station using MPR, MIP and VRT modes. The patient received 175mL OMNIPAQUE IOHEXOL 350 MG/ML SOLN of contrast.  FINDINGS: Suboptimal timing of contrast opacification.  Aortic Valve: Trileaflet aortic valve. Severely reduced cusp separation. Moderately thickened, severely calcified aortic valve cusps.  AV calcium score: 2017  Virtual Basal Annulus Measurements:  Maximum/Minimum Diameter: 25.4 x 22.1 mm  Perimeter: 73.9 mm  Area: 418 mm2  No significant LVOT calcifications.  Based on these measurements, the annulus would be suitable for a 23 mm Sapien 3 valve.  Sinus of Valsalva Measurements:  Non-coronary:  30 mm  Right -  coronary:  30 mm  Left - coronary:  31 mm  Sinus of Valsalva Height:  Left: 14.5 mm  Right: 18.1 mm  Aorta: Severe calcifications. Conventional 3 vessel branch pattern of aortic arch.  Sinotubular Junction:  27 mm  Ascending Thoracic Aorta:  35 mm  Aortic Arch:  28 mm  Descending Thoracic Aorta:  24 mm  Coronary Artery Height above Annulus:  Left Main: 11.3 mm  Right Coronary: 14.3 mm  Coronary Arteries: 3 vessel coronary artery calcifications  Optimum Fluoroscopic Angle for Delivery: RAO 1, CRA 1  No left atrial appendage thrombus.  IMPRESSION: 1. Aortic Valve: Trileaflet aortic valve. Severely reduced cusp separation.  Moderately thickened, severely calcified aortic valve cusps.  2.  AV calcium score: 2017  3.  Annulus Area: 418 mm2, suitable for 23 mm Sapien 3 valve  4.  Adequate coronary artery height from annulus  5. Optimum Fluoroscopic Angle for Delivery: RAO 1, CRA 1   Electronically Signed   By: Cherlynn Kaiser   On: 01/24/2020 21:41   CT ANGIOGRAPHY CHEST, ABDOMEN AND PELVIS  TECHNIQUE: Non-contrast CT of the chest was initially obtained.  Multidetector CT imaging through the chest, abdomen and pelvis was performed using the standard protocol during bolus administration of intravenous contrast. Multiplanar reconstructed images and MIPs were obtained and reviewed to evaluate the vascular anatomy.  CONTRAST:  168mL OMNIPAQUE IOHEXOL 350 MG/ML SOLN  COMPARISON:  None.  FINDINGS: CTA CHEST FINDINGS  Cardiovascular: Heart size is normal. There is no significant pericardial fluid, thickening or pericardial calcification. There is aortic atherosclerosis, as well as atherosclerosis of the great vessels of the mediastinum and the coronary arteries, including calcified atherosclerotic plaque in the left main, left anterior descending, left circumflex and right coronary arteries. Severe thickening calcification of the aortic valve.  Mediastinum/Lymph Nodes: No pathologically enlarged mediastinal or hilar lymph nodes. Esophagus is unremarkable in appearance. No axillary lymphadenopathy.  Lungs/Pleura: No suspicious appearing pulmonary nodules or masses are noted. No acute consolidative airspace disease. No pleural effusions.  Musculoskeletal/Soft Tissues: Multiple old healed and healing fractures of the right ribs laterally and posterolaterally. Orthopedic fixation hardware noted in the lower cervical spine. There are no aggressive appearing lytic or blastic lesions noted in the visualized portions of the skeleton.  CTA ABDOMEN AND PELVIS  FINDINGS  Hepatobiliary: No suspicious cystic or solid hepatic lesions. Small calcified granulomas in the right lobe of the liver. No intra or extrahepatic biliary ductal dilatation. Amorphous intermediate to high attenuation material lying dependently in the gallbladder may reflect tiny gallstones and/or biliary sludge. No findings to suggest an acute cholecystitis at this time.  Pancreas: No pancreatic mass. No pancreatic ductal dilatation. No pancreatic or peripancreatic fluid collections or inflammatory changes.  Spleen: Unremarkable.  Adrenals/Urinary Tract: Multifocal cortical thinning in the kidneys bilaterally. No suspicious renal lesions. No hydroureteronephrosis. Bilateral adrenal glands are normal in appearance. Urinary bladder is normal in appearance.  Stomach/Bowel: Normal appearance of the stomach. No pathologic dilatation of small bowel or colon. The appendix is not confidently identified and may be surgically absent. Regardless, there are no inflammatory changes noted adjacent to the cecum to suggest the presence of an acute appendicitis at this time.  Vascular/Lymphatic: Aortic atherosclerosis, with vascular findings and measurements pertinent to potential TAVR procedure, as detailed below. No aneurysm or dissection noted in the abdominal or pelvic vasculature. No lymphadenopathy noted in the abdomen or pelvis.  Reproductive: Prostate gland and seminal vesicles are unremarkable in appearance.  Other: No significant volume  of ascites.  No pneumoperitoneum.  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 There are no aggressive appearing lytic or blastic lesions noted in the visualized portions of the skeleton.  VASCULAR MEASUREMENTS PERTINENT TO TAVR:  AORTA:  Minimal Aortic Diameter-11 x 11 mm  Severity of Aortic Calcification-severe  RIGHT PELVIS:  Right Common Iliac Artery -  Minimal  Diameter-6.9 x 5.9 mm  Tortuosity-mild  Calcification-moderate  Right External Iliac Artery -  Minimal Diameter-9.0 x 8.8 mm  Tortuosity-moderate  Calcification-none  Right Common Femoral Artery -  Minimal Diameter-9.1 x 7.2 mm  Tortuosity-mild  Calcification-mild  LEFT PELVIS:  Left Common Iliac Artery -  Minimal Diameter-9.1 x 7.3 mm  Tortuosity-mild  Calcification-moderate to severe  Left External Iliac Artery -  Minimal Diameter-9.2 x 8.6 mm  Tortuosity-moderate  Calcification-minimal  Left Common Femoral Artery -  Minimal Diameter-9.1 x 7.4 mm  Tortuosity-mild  Calcification-mild  Review of the MIP images confirms the above findings.  IMPRESSION: 1. Vascular findings and measurements pertinent to potential TAVR procedure, as detailed above. 2. Severe thickening calcification of the aortic valve, compatible with reported clinical history of severe aortic stenosis. 3. Aortic atherosclerosis, in addition to left main and 3 vessel coronary artery disease. Assessment for potential risk factor modification, dietary therapy or pharmacologic therapy may be warranted, if clinically indicated. 4. Multiple old healed and healing right-sided rib fractures laterally and posterolaterally. 5. Small amount of biliary sludge and/or gallstones lying dependently in the gallbladder. No findings to suggest an acute cholecystitis at this time. 6. Additional incidental findings, as above.   Electronically Signed   By: Vinnie Langton M.D.   On: 01/22/2020 14:04   Impression:  Patient has multivessel coronary artery disease including high-grade 95% stenosis of the mid left anterior descending coronary artery and stage D severe symptomatic aortic stenosis.  He describes a long history of slowly progressive symptoms of exertional shortness of breath with more recent symptoms of exertional chest pain consistent with stable exertional  angina, CCS class II-III, and chronic diastolic congestive heart failure New [place] Heart Association functional class II.  I have personally reviewed the patient's recent transthoracic echocardiogram, diagnostic cardiac catheterization and CT angiograms.  Echocardiogram reveals severe aortic stenosis with normal left ventricular systolic function.  The aortic valve appears trileaflet with severe thickening, calcification, and restricted leaflet mobility involving all 3 leaflets.  Peak velocity across aortic valve measured 3.8 m/s corresponding to mean transvalvular gradient estimated 38 mmHg with DVI reported 0.24 and aortic valve area calculated only 0.76 cm.  Diagnostic cardiac catheterization confirmed the presence of severe aortic stenosis with mean transvalvular gradient measured 38 mmHg at catheterization, corresponding to aortic valve area calculated 0.94 cm.  There was mild pulmonary hypertension.  Coronary angiography revealed multivessel coronary artery disease with high-grade 95% discrete stenosis of the mid left anterior descending coronary artery.  There was a tubular 70% stenosis of the proximal portion of a large second obtuse marginal branch of left circumflex coronary artery.  By report baseline twelve-lead EKG reveals no significant AV conduction delay.  Cardiac gated CT angiogram of the heart reveals the presence of a trileaflet aortic valve with severe calcification, reduced cusp separation and moderate leaflet thickening consistent with severe aortic stenosis.  Calcium score was 2017.  There was adequate coronary height and no significant annular calcification nor calcification extending into the left ventricular outflow tract.  The annulus area was relatively small, measuring 418 mm.  CT angiography of the  aorta and iliac vessels demonstrates moderately severe calcification and atherosclerotic disease involving bilateral common iliac arteries, more so on the right side than the left.   However, there appears to be adequate lumen size for transfemoral access on the left side.  I agree the patient needs definitive treatment for both severe aortic stenosis and symptomatic coronary artery disease.  Despite the patient's advanced age risks associated with conventional surgical aortic valve replacement and coronary artery bypass grafting would be relatively low, although the patient might require aortic root enlargement in order to avoid the development of patient prosthesis mismatch following surgical aortic valve replacement using a stented bioprosthetic tissue valve.    Under the circumstances it seems reasonable to consider PCI and stenting of the left anterior descending coronary artery with transcatheter aortic valve replacement as a less invasive alternative given that CT angiography reveals anatomical characteristics suitable for transcatheter aortic valve replacement without any complicating features.     Plan:  The patient and his wife were again counseled at length regarding treatment alternatives for management of severe symptomatic aortic stenosis and multivessel coronary artery disease.  Alternative approaches such as conventional aortic valve replacement with coronary artery bypass grafting, transcatheter aortic valve replacement with PCI and stenting of the LAD coronary artery, and continued medical therapy without intervention were compared and contrasted at length.  The risks associated with conventional surgery were discussed in detail, as were expectations for post-operative convalescence.  Issues specific to PCI and transcatheter aortic valve replacement were discussed including questions about long term stent and valve durability, the potential for recurrent ischemic heart disease or paravalvular leak, possible increased risk of need for permanent pacemaker placement, and other technical complications related to the TAVR procedure itself.  Long-term prognosis with medical  therapy was discussed. This discussion was placed in the context of the patient's own specific clinical presentation and past medical history.  All of their questions have been addressed.  The patient hopes to proceed with PCI and stenting of the left anterior descending coronary artery followed by transcatheter aortic valve replacement in the near future.    I spent in excess of 15 minutes during the conduct of this office consultation and >50% of this time involved direct face-to-face encounter with the patient for counseling and/or coordination of their care.   Valentina Gu. Roxy Manns, MD 01/25/2020 9:41 AM

## 2020-01-28 ENCOUNTER — Encounter (HOSPITAL_COMMUNITY)
Admission: RE | Disposition: A | Discharge status: Home / Self Care | Payer: Self-pay | Source: Home / Self Care | Attending: Cardiovascular Disease

## 2020-01-28 ENCOUNTER — Other Ambulatory Visit: Payer: Self-pay

## 2020-01-28 ENCOUNTER — Ambulatory Visit (HOSPITAL_COMMUNITY)
Admission: RE | Admit: 2020-01-28 | Discharge: 2020-01-28 | Disposition: A | Discharge status: Home / Self Care | Payer: Medicare Other | Attending: Cardiovascular Disease | Admitting: Cardiovascular Disease

## 2020-01-28 DIAGNOSIS — Z79899 Other long term (current) drug therapy: Secondary | ICD-10-CM | POA: Diagnosis not present

## 2020-01-28 DIAGNOSIS — E782 Mixed hyperlipidemia: Secondary | ICD-10-CM | POA: Insufficient documentation

## 2020-01-28 DIAGNOSIS — I272 Pulmonary hypertension, unspecified: Secondary | ICD-10-CM | POA: Diagnosis not present

## 2020-01-28 DIAGNOSIS — I25119 Atherosclerotic heart disease of native coronary artery with unspecified angina pectoris: Secondary | ICD-10-CM | POA: Diagnosis not present

## 2020-01-28 DIAGNOSIS — Z7982 Long term (current) use of aspirin: Secondary | ICD-10-CM | POA: Diagnosis not present

## 2020-01-28 DIAGNOSIS — Z7902 Long term (current) use of antithrombotics/antiplatelets: Secondary | ICD-10-CM | POA: Insufficient documentation

## 2020-01-28 DIAGNOSIS — I350 Nonrheumatic aortic (valve) stenosis: Secondary | ICD-10-CM | POA: Diagnosis not present

## 2020-01-28 DIAGNOSIS — I700 Atherosclerosis of aorta: Secondary | ICD-10-CM | POA: Insufficient documentation

## 2020-01-28 DIAGNOSIS — I10 Essential (primary) hypertension: Secondary | ICD-10-CM | POA: Diagnosis not present

## 2020-01-28 DIAGNOSIS — E119 Type 2 diabetes mellitus without complications: Secondary | ICD-10-CM | POA: Diagnosis not present

## 2020-01-28 DIAGNOSIS — Z955 Presence of coronary angioplasty implant and graft: Secondary | ICD-10-CM

## 2020-01-28 DIAGNOSIS — Z7984 Long term (current) use of oral hypoglycemic drugs: Secondary | ICD-10-CM | POA: Insufficient documentation

## 2020-01-28 HISTORY — PX: CORONARY STENT INTERVENTION: CATH118234

## 2020-01-28 HISTORY — PX: INTRAVASCULAR PRESSURE WIRE/FFR STUDY: CATH118243

## 2020-01-28 LAB — GLUCOSE, CAPILLARY
Glucose-Capillary: 73 mg/dL (ref 70–99)
Glucose-Capillary: 71 mg/dL (ref 70–99)

## 2020-01-28 LAB — POCT ACTIVATED CLOTTING TIME
Activated Clotting Time: 241 seconds
Activated Clotting Time: 246 seconds

## 2020-01-28 SURGERY — CORONARY STENT INTERVENTION
Anesthesia: LOCAL

## 2020-01-28 MED ORDER — SODIUM CHLORIDE 0.9 % IV SOLN: 250.0000 mL | INTRAVENOUS | Status: DC | PRN

## 2020-01-28 MED ORDER — HEPARIN SODIUM (PORCINE) 1000 UNIT/ML IJ SOLN
INTRAMUSCULAR | Status: AC
  Filled 2020-01-28: qty 1

## 2020-01-28 MED ORDER — VERAPAMIL HCL 2.5 MG/ML IV SOLN
INTRAVENOUS | Status: AC
  Filled 2020-01-28: qty 2

## 2020-01-28 MED ORDER — CLOPIDOGREL BISULFATE 75 MG PO TABS: 75.0000 mg | ORAL_TABLET | ORAL | Status: DC

## 2020-01-28 MED ORDER — HYDRALAZINE HCL 20 MG/ML IJ SOLN: 10.0000 mg | INTRAMUSCULAR | Status: AC | PRN

## 2020-01-28 MED ORDER — HEPARIN (PORCINE) IN NACL 1000-0.9 UT/500ML-% IV SOLN
INTRAVENOUS | Status: DC | PRN
  Administered 2020-01-28 (×2): 500 mL

## 2020-01-28 MED ORDER — ATORVASTATIN CALCIUM 40 MG PO TABS: 40.0000 mg | ORAL_TABLET | Freq: Every day | ORAL | 3 refills | Status: DC

## 2020-01-28 MED ORDER — MIDAZOLAM HCL 2 MG/2ML IJ SOLN
INTRAMUSCULAR | Status: AC
  Filled 2020-01-28: qty 2

## 2020-01-28 MED ORDER — SODIUM CHLORIDE 0.9% FLUSH: 3.0000 mL | INTRAVENOUS | Status: DC | PRN

## 2020-01-28 MED ORDER — NITROGLYCERIN 1 MG/10 ML FOR IR/CATH LAB
INTRA_ARTERIAL | Status: DC | PRN
  Administered 2020-01-28: 100 ug via INTRACORONARY

## 2020-01-28 MED ORDER — ASPIRIN 81 MG PO CHEW: 81.0000 mg | CHEWABLE_TABLET | ORAL | Status: DC

## 2020-01-28 MED ORDER — ACETAMINOPHEN 325 MG PO TABS: 650.0000 mg | ORAL_TABLET | ORAL | Status: DC | PRN

## 2020-01-28 MED ORDER — NITROGLYCERIN 1 MG/10 ML FOR IR/CATH LAB
INTRA_ARTERIAL | Status: AC
  Filled 2020-01-28: qty 10

## 2020-01-28 MED ORDER — IOHEXOL 350 MG/ML SOLN
INTRAVENOUS | Status: DC | PRN
  Administered 2020-01-28: 100 mL

## 2020-01-28 MED ORDER — VERAPAMIL HCL 2.5 MG/ML IV SOLN
INTRAVENOUS | Status: DC | PRN
  Administered 2020-01-28: 10 mL via INTRA_ARTERIAL

## 2020-01-28 MED ORDER — FENTANYL CITRATE (PF) 100 MCG/2ML IJ SOLN
INTRAMUSCULAR | Status: DC | PRN
  Administered 2020-01-28: 25 ug via INTRAVENOUS

## 2020-01-28 MED ORDER — LIDOCAINE HCL (PF) 1 % IJ SOLN
INTRAMUSCULAR | Status: AC
  Filled 2020-01-28: qty 30

## 2020-01-28 MED ORDER — SODIUM CHLORIDE 0.9 % WEIGHT BASED INFUSION
3.0000 mL/kg/h | INTRAVENOUS | Status: AC
  Administered 2020-01-28: 3 mL/kg/h via INTRAVENOUS

## 2020-01-28 MED ORDER — SODIUM CHLORIDE 0.9 % WEIGHT BASED INFUSION: 1.0000 mL/kg/h | INTRAVENOUS | Status: DC

## 2020-01-28 MED ORDER — HEPARIN SODIUM (PORCINE) 1000 UNIT/ML IJ SOLN
INTRAMUSCULAR | Status: DC | PRN
  Administered 2020-01-28: 9000 [IU] via INTRAVENOUS
  Administered 2020-01-28: 3000 [IU] via INTRAVENOUS

## 2020-01-28 MED ORDER — SODIUM CHLORIDE 0.9% FLUSH: 3.0000 mL | Freq: Two times a day (BID) | INTRAVENOUS | Status: DC

## 2020-01-28 MED ORDER — LABETALOL HCL 5 MG/ML IV SOLN: 10.0000 mg | INTRAVENOUS | Status: AC | PRN

## 2020-01-28 MED ORDER — ONDANSETRON HCL 4 MG/2ML IJ SOLN: 4.0000 mg | Freq: Four times a day (QID) | INTRAMUSCULAR | Status: DC | PRN

## 2020-01-28 MED ORDER — HEPARIN (PORCINE) IN NACL 1000-0.9 UT/500ML-% IV SOLN
INTRAVENOUS | Status: AC
  Filled 2020-01-28: qty 1000

## 2020-01-28 MED ORDER — FENTANYL CITRATE (PF) 100 MCG/2ML IJ SOLN
INTRAMUSCULAR | Status: AC
  Filled 2020-01-28: qty 2

## 2020-01-28 MED ORDER — LIDOCAINE HCL (PF) 1 % IJ SOLN
INTRAMUSCULAR | Status: DC | PRN
  Administered 2020-01-28: 2 mL

## 2020-01-28 MED ORDER — MIDAZOLAM HCL 2 MG/2ML IJ SOLN
INTRAMUSCULAR | Status: DC | PRN
  Administered 2020-01-28: 1 mg via INTRAVENOUS

## 2020-01-28 SURGICAL SUPPLY — 16 items
BALLN EMERGE MR 2.5X20 (BALLOONS) ×2
BALLN ~~LOC~~ EMERGE MR 3.25X20 (BALLOONS) ×2
BALLOON EMERGE MR 2.5X20 (BALLOONS) ×1 IMPLANT
BALLOON ~~LOC~~ EMERGE MR 3.25X20 (BALLOONS) ×1 IMPLANT
CATH LAUNCHER 6FR EBU3.5 (CATHETERS) ×2 IMPLANT
DEVICE RAD COMP TR BAND LRG (VASCULAR PRODUCTS) ×2 IMPLANT
GLIDESHEATH SLEND SS 6F .021 (SHEATH) ×2 IMPLANT
GUIDEWIRE INQWIRE 1.5J.035X260 (WIRE) ×1 IMPLANT
GUIDEWIRE PRESSURE X 175 (WIRE) ×2 IMPLANT
INQWIRE 1.5J .035X260CM (WIRE) ×2
KIT HEART LEFT (KITS) ×2 IMPLANT
PACK CARDIAC CATHETERIZATION (CUSTOM PROCEDURE TRAY) ×2 IMPLANT
STENT RESOLUTE ONYX3.0X38 (Permanent Stent) ×2 IMPLANT
TRANSDUCER W/STOPCOCK (MISCELLANEOUS) ×2 IMPLANT
TUBING CIL FLEX 10 FLL-RA (TUBING) ×2 IMPLANT
WIRE COUGAR XT STRL 190CM (WIRE) ×2 IMPLANT

## 2020-01-28 NOTE — Discharge Summary (Signed)
Discharge Summary for Same Day PCI   Patient ID: AEON CAULLEY MRN: [id number]; DOB: 30-Apr-1941  Admit date: 01/28/2020 Discharge date: 01/28/2020  Primary Care Provider: Myrlene Broker, MD  Primary Cardiologist: Dr. Otho Perl Primary Electrophysiologist:  None   Discharge Diagnoses    Active Problems:   Coronary artery disease involving native coronary artery of native heart with angina pectoris [facility])  Diagnostic Studies/Procedures    Cardiac Catheterization 01/28/2020: 1.  Successful PCI of the proximal LAD with implantation of a 3.0 x 38 mm resolute Onyx DES 2.  Negative RFR analysis of moderate stenosis in a large second obtuse marginal branch of the circumflex  Recommend: Dual antiplatelet therapy with aspirin and clopidogrel at least 6 months without interruption, same-day PCI protocol as long as no early complications arise.  TAVR is planned in the near future.  Diagnostic Dominance: Right  Intervention    _____________   History of Present Illness     Andre Jimenez is a 79 y.o. male with a history of CAD, severe aortic stenosis, hypertension, hyperlipidemia, and type 2 diabetes mellitus who was referred to Dr. Roxy Manns by Dr. Otho Perl for severe symptomatic aortic stenosis and coronary artery disease with exertional angina.  He was initially seen in the [place] office for consultation on January 15, 2020.  Since then he underwent CT angiography on January 22, 2020.  He reported no new problems or complaints over the past 10 days.  He stated that he had been taking it easy around the house and not trying to do anything too strenuous.  He would still get short of breath with exertion and he has chest tightness if he tried to do anything strenuous. He had a diagnostic cath through his PCP cardiologist that showed high grade lesion of the mLAD and moderate disease a 2nd OM. He was considered to be an acceptable candidate for TAVR. Referred to Dr. Burt Knack for cardiac cath.   Hospital  Course     The patient underwent cardiac cath as noted above with successful PCI/DES x1 to the pLAD. Did have moderate stenosis of a large 2nd OM but negative via RFR of 0.92. Plan for DAPT with ASA/plavix for at least 6 months. The patient was seen by cardiac rehab while in short stay. There were no observed complications post cath. Radial cath site was re-evaluated prior to discharge and found to be stable without any complications. Instructions/precautions regarding cath site care were given prior to discharge.  [name] was seen by Dr. Burt Knack and determined stable for discharge home. Follow up with our office has been arranged. Medications are listed below. Lipitor was increased to 40mg  daily.   _____________  Cath/PCI Registry Performance & Quality Measures: 1. Aspirin prescribed? - Yes 2. ADP Receptor Inhibitor (Plavix/Clopidogrel, Brilinta/Ticagrelor or Effient/Prasugrel) prescribed (includes medically managed patients)? - Yes 3. High Intensity Statin (Lipitor 40-80mg  or Crestor 20-40mg ) prescribed? - Yes 4. For EF <40%, was ACEI/ARB prescribed? - NA 5. For EF <40%, Aldosterone Antagonist (Spironolactone or Eplerenone) prescribed? - Not Applicable (EF >/= [id number]) 6. Cardiac Rehab Phase II ordered (Included Medically managed Patients)? - Yes  _____________   Discharge Vitals Blood pressure 136/63, pulse (!) 57, temperature 97.7 F (36.5 C), temperature source Skin, resp. rate 16, height 5\' 7"  (1.702 m), weight 90.7 kg, SpO2 98 %.  Filed Weights   01/28/20 0540  Weight: 90.7 kg    Last Labs & Radiologic Studies    CBC No results for input(s): WBC, NEUTROABS, HGB,  HCT, MCV, PLT in the last 72 hours. Basic Metabolic Panel No results for input(s): NA, K, CL, CO2, GLUCOSE, BUN, CREATININE, CALCIUM, MG, PHOS in the last 72 hours. Liver Function Tests No results for input(s): AST, ALT, ALKPHOS, BILITOT, PROT, ALBUMIN in the last 72 hours. No results for input(s): LIPASE,  AMYLASE in the last 72 hours. High Sensitivity Troponin:   No results for input(s): TROPONINIHS in the last 720 hours.  BNP Invalid input(s): POCBNP D-Dimer No results for input(s): DDIMER in the last 72 hours. Hemoglobin A1C No results for input(s): HGBA1C in the last 72 hours. Fasting Lipid Panel No results for input(s): CHOL, HDL, LDLCALC, TRIG, CHOLHDL, LDLDIRECT in the last 72 hours. Thyroid Function Tests No results for input(s): TSH, T4TOTAL, T3FREE, THYROIDAB in the last 72 hours.  Invalid input(s): FREET3 _____________  CARDIAC CATHETERIZATION  Result Date: 01/28/2020 1.  Successful PCI of the proximal LAD with implantation of a 3.0 x 38 mm resolute Onyx DES 2.  Negative RFR analysis of moderate stenosis in a large second obtuse marginal branch of the circumflex Recommend: Dual antiplatelet therapy with aspirin and clopidogrel at least 6 months without interruption, same-day PCI protocol as long as no early complications arise.  TAVR is planned in the near future.  CT CORONARY MORPH W/CTA COR W/SCORE W/CA W/CM &/OR WO/CM  Addendum Date: 01/24/2020   ADDENDUM REPORT: 01/24/2020 21:41 CLINICAL DATA:  Aortic stenosis EXAM: Cardiac TAVR CT TECHNIQUE: The patient was scanned on a Siemens Force [id number] slice scanner. A 120 kV retrospective scan was triggered in the descending thoracic aorta at 111 HU's. Gantry rotation speed was 270 msecs and collimation was .9 mm. No beta blockade or nitro were given. The 3D data set was reconstructed in 5% intervals of the R-R cycle. Systolic and diastolic phases were analyzed on a dedicated work station using MPR, MIP and VRT modes. The patient received 172mL OMNIPAQUE IOHEXOL 350 MG/ML SOLN of contrast. FINDINGS: Suboptimal timing of contrast opacification. Aortic Valve: Trileaflet aortic valve. Severely reduced cusp separation. Moderately thickened, severely calcified aortic valve cusps. AV calcium score: 2017 Virtual Basal Annulus Measurements:  Maximum/Minimum Diameter: 25.4 x 22.1 mm Perimeter: 73.9 mm Area: 418 mm2 No significant LVOT calcifications. Based on these measurements, the annulus would be suitable for a 23 mm Sapien 3 valve. Sinus of Valsalva Measurements: Non-coronary:  30 mm Right - coronary:  30 mm Left - coronary:  31 mm Sinus of Valsalva Height: Left: 14.5 mm Right: 18.1 mm Aorta: Severe calcifications. Conventional 3 vessel branch pattern of aortic arch. Sinotubular Junction:  27 mm Ascending Thoracic Aorta:  35 mm Aortic Arch:  28 mm Descending Thoracic Aorta:  24 mm Coronary Artery Height above Annulus: Left Main: 11.3 mm Right Coronary: 14.3 mm Coronary Arteries: 3 vessel coronary artery calcifications Optimum Fluoroscopic Angle for Delivery: RAO 1, CRA 1 No left atrial appendage thrombus. IMPRESSION: 1. Aortic Valve: Trileaflet aortic valve. Severely reduced cusp separation. Moderately thickened, severely calcified aortic valve cusps. 2.  AV calcium score: 2017 3.  Annulus Area: 418 mm2, suitable for 23 mm Sapien 3 valve 4.  Adequate coronary artery height from annulus 5. Optimum Fluoroscopic Angle for Delivery: RAO 1, CRA 1 Electronically Signed   By: Cherlynn Kaiser   On: 01/24/2020 21:41   Result Date: 01/24/2020 EXAM: OVER-READ INTERPRETATION  CT CHEST The following report is an over-read performed by radiologist Dr. Vinnie Langton of [facility], [place] on 01/22/2020. This over-read does not include interpretation of cardiac or  coronary anatomy or pathology. The coronary calcium score/coronary CTA interpretation by the cardiologist is attached. COMPARISON:  None. FINDINGS: Extracardiac findings will be described separately under dictation for contemporaneously obtained CTA chest, abdomen and pelvis dated 01/22/2020. IMPRESSION: Please see separate dictation for contemporaneously obtained CTA chest, abdomen and pelvis dated 01/22/2020 for full description of relevant extracardiac findings. Electronically Signed: By:  Vinnie Langton M.D. On: 01/22/2020 13:33   CT ANGIO CHEST AORTA W/CM & OR WO/CM  Result Date: 01/22/2020 CLINICAL DATA:  79 year old male with history of severe aortic stenosis. Preprocedural study prior to potential transcatheter aortic valve replacement (TAVR) procedure. EXAM: CT ANGIOGRAPHY CHEST, ABDOMEN AND PELVIS TECHNIQUE: Non-contrast CT of the chest was initially obtained. Multidetector CT imaging through the chest, abdomen and pelvis was performed using the standard protocol during bolus administration of intravenous contrast. Multiplanar reconstructed images and MIPs were obtained and reviewed to evaluate the vascular anatomy. CONTRAST:  147mL OMNIPAQUE IOHEXOL 350 MG/ML SOLN COMPARISON:  None. FINDINGS: CTA CHEST FINDINGS Cardiovascular: Heart size is normal. There is no significant pericardial fluid, thickening or pericardial calcification. There is aortic atherosclerosis, as well as atherosclerosis of the great vessels of the mediastinum and the coronary arteries, including calcified atherosclerotic plaque in the left main, left anterior descending, left circumflex and right coronary arteries. Severe thickening calcification of the aortic valve. Mediastinum/Lymph Nodes: No pathologically enlarged mediastinal or hilar lymph nodes. Esophagus is unremarkable in appearance. No axillary lymphadenopathy. Lungs/Pleura: No suspicious appearing pulmonary nodules or masses are noted. No acute consolidative airspace disease. No pleural effusions. Musculoskeletal/Soft Tissues: Multiple old healed and healing fractures of the right ribs laterally and posterolaterally. Orthopedic fixation hardware noted in the lower cervical spine. There are no aggressive appearing lytic or blastic lesions noted in the visualized portions of the skeleton. CTA ABDOMEN AND PELVIS FINDINGS Hepatobiliary: No suspicious cystic or solid hepatic lesions. Small calcified granulomas in the right lobe of the liver. No intra or  extrahepatic biliary ductal dilatation. Amorphous intermediate to high attenuation material lying dependently in the gallbladder may reflect tiny gallstones and/or biliary sludge. No findings to suggest an acute cholecystitis at this time. Pancreas: No pancreatic mass. No pancreatic ductal dilatation. No pancreatic or peripancreatic fluid collections or inflammatory changes. Spleen: Unremarkable. Adrenals/Urinary Tract: Multifocal cortical thinning in the kidneys bilaterally. No suspicious renal lesions. No hydroureteronephrosis. Bilateral adrenal glands are normal in appearance. Urinary bladder is normal in appearance. Stomach/Bowel: Normal appearance of the stomach. No pathologic dilatation of small bowel or colon. The appendix is not confidently identified and may be surgically absent. Regardless, there are no inflammatory changes noted adjacent to the cecum to suggest the presence of an acute appendicitis at this time. Vascular/Lymphatic: Aortic atherosclerosis, with vascular findings and measurements pertinent to potential TAVR procedure, as detailed below. No aneurysm or dissection noted in the abdominal or pelvic vasculature. No lymphadenopathy noted in the abdomen or pelvis. Reproductive: Prostate gland and seminal vesicles are unremarkable in appearance. Other: No significant volume of ascites.  No pneumoperitoneum. 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 There are no aggressive appearing lytic or blastic lesions noted in the visualized portions of the skeleton. VASCULAR MEASUREMENTS PERTINENT TO TAVR: AORTA: Minimal Aortic Diameter-11 x 11 mm Severity of Aortic Calcification-severe RIGHT PELVIS: Right Common Iliac Artery - Minimal Diameter-6.9 x 5.9 mm Tortuosity-mild Calcification-moderate Right External Iliac Artery - Minimal Diameter-9.0 x 8.8 mm Tortuosity-moderate Calcification-none Right Common Femoral Artery - Minimal Diameter-9.1 x 7.2 mm Tortuosity-mild  Calcification-mild LEFT PELVIS:  Left Common Iliac Artery - Minimal Diameter-9.1 x 7.3 mm Tortuosity-mild Calcification-moderate to severe Left External Iliac Artery - Minimal Diameter-9.2 x 8.6 mm Tortuosity-moderate Calcification-minimal Left Common Femoral Artery - Minimal Diameter-9.1 x 7.4 mm Tortuosity-mild Calcification-mild Review of the MIP images confirms the above findings. IMPRESSION: 1. Vascular findings and measurements pertinent to potential TAVR procedure, as detailed above. 2. Severe thickening calcification of the aortic valve, compatible with reported clinical history of severe aortic stenosis. 3. Aortic atherosclerosis, in addition to left main and 3 vessel coronary artery disease. Assessment for potential risk factor modification, dietary therapy or pharmacologic therapy may be warranted, if clinically indicated. 4. Multiple old healed and healing right-sided rib fractures laterally and posterolaterally. 5. Small amount of biliary sludge and/or gallstones lying dependently in the gallbladder. No findings to suggest an acute cholecystitis at this time. 6. Additional incidental findings, as above. Electronically Signed   By: Vinnie Langton M.D.   On: 01/22/2020 14:04   CT Angio Abd/Pel w/ and/or w/o  Result Date: 01/22/2020 CLINICAL DATA:  79 year old male with history of severe aortic stenosis. Preprocedural study prior to potential transcatheter aortic valve replacement (TAVR) procedure. EXAM: CT ANGIOGRAPHY CHEST, ABDOMEN AND PELVIS TECHNIQUE: Non-contrast CT of the chest was initially obtained. Multidetector CT imaging through the chest, abdomen and pelvis was performed using the standard protocol during bolus administration of intravenous contrast. Multiplanar reconstructed images and MIPs were obtained and reviewed to evaluate the vascular anatomy. CONTRAST:  134mL OMNIPAQUE IOHEXOL 350 MG/ML SOLN COMPARISON:  None. FINDINGS: CTA CHEST FINDINGS Cardiovascular: Heart size is normal.  There is no significant pericardial fluid, thickening or pericardial calcification. There is aortic atherosclerosis, as well as atherosclerosis of the great vessels of the mediastinum and the coronary arteries, including calcified atherosclerotic plaque in the left main, left anterior descending, left circumflex and right coronary arteries. Severe thickening calcification of the aortic valve. Mediastinum/Lymph Nodes: No pathologically enlarged mediastinal or hilar lymph nodes. Esophagus is unremarkable in appearance. No axillary lymphadenopathy. Lungs/Pleura: No suspicious appearing pulmonary nodules or masses are noted. No acute consolidative airspace disease. No pleural effusions. Musculoskeletal/Soft Tissues: Multiple old healed and healing fractures of the right ribs laterally and posterolaterally. Orthopedic fixation hardware noted in the lower cervical spine. There are no aggressive appearing lytic or blastic lesions noted in the visualized portions of the skeleton. CTA ABDOMEN AND PELVIS FINDINGS Hepatobiliary: No suspicious cystic or solid hepatic lesions. Small calcified granulomas in the right lobe of the liver. No intra or extrahepatic biliary ductal dilatation. Amorphous intermediate to high attenuation material lying dependently in the gallbladder may reflect tiny gallstones and/or biliary sludge. No findings to suggest an acute cholecystitis at this time. Pancreas: No pancreatic mass. No pancreatic ductal dilatation. No pancreatic or peripancreatic fluid collections or inflammatory changes. Spleen: Unremarkable. Adrenals/Urinary Tract: Multifocal cortical thinning in the kidneys bilaterally. No suspicious renal lesions. No hydroureteronephrosis. Bilateral adrenal glands are normal in appearance. Urinary bladder is normal in appearance. Stomach/Bowel: Normal appearance of the stomach. No pathologic dilatation of small bowel or colon. The appendix is not confidently identified and may be surgically  absent. Regardless, there are no inflammatory changes noted adjacent to the cecum to suggest the presence of an acute appendicitis at this time. Vascular/Lymphatic: Aortic atherosclerosis, with vascular findings and measurements pertinent to potential TAVR procedure, as detailed below. No aneurysm or dissection noted in the abdominal or pelvic vasculature. No lymphadenopathy noted in the abdomen or pelvis. Reproductive: Prostate gland and seminal vesicles are unremarkable in appearance. Other: No  significant volume of ascites.  No pneumoperitoneum. 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 There are no aggressive appearing lytic or blastic lesions noted in the visualized portions of the skeleton. VASCULAR MEASUREMENTS PERTINENT TO TAVR: AORTA: Minimal Aortic Diameter-11 x 11 mm Severity of Aortic Calcification-severe RIGHT PELVIS: Right Common Iliac Artery - Minimal Diameter-6.9 x 5.9 mm Tortuosity-mild Calcification-moderate Right External Iliac Artery - Minimal Diameter-9.0 x 8.8 mm Tortuosity-moderate Calcification-none Right Common Femoral Artery - Minimal Diameter-9.1 x 7.2 mm Tortuosity-mild Calcification-mild LEFT PELVIS: Left Common Iliac Artery - Minimal Diameter-9.1 x 7.3 mm Tortuosity-mild Calcification-moderate to severe Left External Iliac Artery - Minimal Diameter-9.2 x 8.6 mm Tortuosity-moderate Calcification-minimal Left Common Femoral Artery - Minimal Diameter-9.1 x 7.4 mm Tortuosity-mild Calcification-mild Review of the MIP images confirms the above findings. IMPRESSION: 1. Vascular findings and measurements pertinent to potential TAVR procedure, as detailed above. 2. Severe thickening calcification of the aortic valve, compatible with reported clinical history of severe aortic stenosis. 3. Aortic atherosclerosis, in addition to left main and 3 vessel coronary artery disease. Assessment for potential risk factor modification, dietary therapy or  pharmacologic therapy may be warranted, if clinically indicated. 4. Multiple old healed and healing right-sided rib fractures laterally and posterolaterally. 5. Small amount of biliary sludge and/or gallstones lying dependently in the gallbladder. No findings to suggest an acute cholecystitis at this time. 6. Additional incidental findings, as above. Electronically Signed   By: Vinnie Langton M.D.   On: 01/22/2020 14:04    Disposition   Patient is being discharged home today in good condition.  Follow-up Plans & Appointments    Follow-up Information    TAVR Follow up on 02/09/2020.   Why: The TAVR have made arrangements for your surgery. They will contact regarding further plans.         Discharge Instructions    Amb Referral to Cardiac Rehabilitation   Complete by: As directed    Diagnosis: Coronary Stents   After initial evaluation and assessments completed: Virtual Based Care may be provided alone or in conjunction with Phase 2 Cardiac Rehab based on patient barriers.: Yes       Discharge Medications   Allergies as of 01/28/2020   No Known Allergies     Medication List    TAKE these medications   amLODipine 10 MG tablet Commonly known as: NORVASC Take 10 mg by mouth daily.   aspirin EC 81 MG tablet Take 81 mg by mouth daily.   atorvastatin 40 MG tablet Commonly known as: LIPITOR Take 1 tablet (40 mg total) by mouth daily. What changed:   medication strength  how much to take   clopidogrel 75 MG tablet Commonly known as: PLAVIX Take 1 tablet (75 mg total) by mouth daily.   Fluocinolone Acetonide 0.01 % Oil Place 1 drop in ear(s) daily as needed (itching).   glipiZIDE 5 MG 24 hr tablet Commonly known as: GLUCOTROL XL Take 5 mg by mouth in the morning and at bedtime.   metFORMIN 1000 MG tablet Commonly known as: GLUCOPHAGE Take 1,000 mg by mouth 2 (two) times daily with a meal.   pantoprazole 40 MG tablet Commonly known as: PROTONIX Take 1 tablet  (40 mg total) by mouth daily.   telmisartan-hydrochlorothiazide 40-12.5 MG tablet Commonly known as: MICARDIS HCT Take 1 tablet by mouth daily.       Allergies No Known Allergies  Outstanding Labs/Studies   FLP/LFTs in 8 weeks.   Duration of Discharge Encounter  Greater than 30 minutes including physician time.  Signed, Reino Bellis, NP 01/28/2020, 2:28 PM

## 2020-01-28 NOTE — Interval H&P Note (Signed)
Cath Lab Visit (complete for each Cath Lab visit)  Clinical Evaluation Leading to the Procedure:   ACS: No.  Non-ACS:    Anginal Classification: CCS III  Anti-ischemic medical therapy: Minimal Therapy (1 class of medications)  Non-Invasive Test Results: No non-invasive testing performed  Prior CABG: No previous CABG  Pt pending TAVR, with severe proximal LAD stenosis and CCS 3 angina. Plan PCI of the LAD followed by TAVR. He has been loaded with plavix.  History and Physical Interval Note:  01/28/2020 5:38 AM  Andre Jimenez  has presented today for surgery, with the diagnosis of cad - aortic stenosis.  The various methods of treatment have been discussed with the patient and family. After consideration of risks, benefits and other options for treatment, the patient has consented to  Procedure(s): CORONARY STENT INTERVENTION (N/A) as a surgical intervention.  The patient's history has been reviewed, patient examined, no change in status, stable for surgery.  I have reviewed the patient's chart and labs.  Questions were answered to the patient's satisfaction.     Sherren Mocha

## 2020-01-28 NOTE — Discharge Instructions (Signed)
Radial Site Care  This sheet gives you information about how to care for yourself after your procedure. Your health care provider may also give you more specific instructions. If you have problems or questions, contact your health care provider. What can I expect after the procedure? After the procedure, it is common to have:  Bruising and tenderness at the catheter insertion area. Follow these instructions at home: Medicines  Take over-the-counter and prescription medicines only as told by your health care provider. Insertion site care  Follow instructions from your health care provider about how to take care of your insertion site. Make sure you: ? Wash your hands with soap and water before you change your bandage (dressing). If soap and water are not available, use hand sanitizer. ? Change your dressing as told by your health care provider. ? Leave stitches (sutures), skin glue, or adhesive strips in place. These skin closures may need to stay in place for 2 weeks or longer. If adhesive strip edges start to loosen and curl up, you may trim the loose edges. Do not remove adhesive strips completely unless your health care provider tells you to do that.  Check your insertion site every day for signs of infection. Check for: ? Redness, swelling, or pain. ? Fluid or blood. ? Pus or a bad smell. ? Warmth.  Do not take baths, swim, or use a hot tub until your health care provider approves.  You may shower 24-48 hours after the procedure, or as directed by your health care provider. ? Remove the dressing and gently wash the site with plain soap and water. ? Pat the area dry with a clean towel. ? Do not rub the site. That could cause bleeding.  Do not apply powder or lotion to the site. Activity   For 24 hours after the procedure, or as directed by your health care provider: ? Do not flex or bend the affected arm. ? Do not push or pull heavy objects with the affected arm. ? Do not  drive yourself home from the hospital or clinic. You may drive 24 hours after the procedure unless your health care provider tells you not to. ? Do not operate machinery or power tools.  Do not lift anything that is heavier than 10 lb (4.5 kg), or the limit that you are told, until your health care provider says that it is safe.  Ask your health care provider when it is okay to: ? Return to work or school. ? Resume usual physical activities or sports. ? Resume sexual activity. General instructions  If the catheter site starts to bleed, raise your arm and put firm pressure on the site. If the bleeding does not stop, get help right away. This is a medical emergency.  If you went home on the same day as your procedure, a responsible adult should be with you for the first 24 hours after you arrive home.  Keep all follow-up visits as told by your health care provider. This is important. Contact a health care provider if:  You have a fever.  You have redness, swelling, or yellow drainage around your insertion site. Get help right away if:  You have unusual pain at the radial site.  The catheter insertion area swells very fast.  The insertion area is bleeding, and the bleeding does not stop when you hold steady pressure on the area.  Your arm or hand becomes pale, cool, tingly, or numb. These symptoms may represent a serious problem   that is an emergency. Do not wait to see if the symptoms will go away. Get medical help right away. Call your local emergency services (911 in the [place] Do not drive yourself to the hospital. Summary  After the procedure, it is common to have bruising and tenderness at the site.  Follow instructions from your health care provider about how to take care of your radial site wound. Check the wound every day for signs of infection.  Do not lift anything that is heavier than 10 lb (4.5 kg), or the limit that you are told, until your health care provider says  that it is safe. This information is not intended to replace advice given to you by your health care provider. Make sure you discuss any questions you have with your health care provider. Document Revised: 10/23/2017 Document Reviewed: 10/23/2017 Elsevier Patient Education  2020 [place] about your medication: Plavix (anti-platelet agent)  Generic Name (Brand): clopidogrel (Plavix), once daily medication  PURPOSE: You are taking this medication along with aspirin to lower your chance of having a heart attack, stroke, or blood clots in your heart stent. These can be fatal. Plavix and aspirin help prevent platelets from sticking together and forming a clot that can block an artery or your stent.   Common SIDE EFFECTS you may experience include: bruising or bleeding more easily, shortness of breath  Do not stop taking PLAVIX without talking to the doctor who prescribes it for you. People who are treated with a stent and stop taking Plavix too soon, have a higher risk of getting a blood clot in the stent, having a heart attack, or dying. If you stop Plavix because of bleeding, or for other reasons, your risk of a heart attack or stroke may increase.   Tell all of your doctors and dentists that you are taking Plavix. They should talk to the doctor who prescribed plavix for you before you have any surgery or invasive procedure.   Contact your health care provider if you experience: severe or uncontrollable bleeding, pink/red/brown urine, vomiting blood or vomit that looks like "coffee grounds", red or black stools (looks like tar), coughing up blood or blood clots ----------------------------------------------------------------------------------------------------------------------

## 2020-01-28 NOTE — Progress Notes (Signed)
CARDIAC REHAB PHASE I   Stent education completed with pt. Pt educated on importance of ASA and Plavix. Pt given stent card along with heart healthy and diabetic diets. Reviewed restrictions. Encouraged ambulation as able, but will hold exercise guidelines until after TAVR. Will refer to CRP II [place] but aware pt needs TAVR.  [id number] Rufina Falco, RN BSN 01/28/2020 1:25 PM

## 2020-01-28 NOTE — Consult Note (Addendum)
[facility]   MULTIDISCIPLINARY HEART VALVE TEAM  Date:  01/28/2020   ID:  Andre Jimenez, DOB 12/07/1940, MRN [id number]  PCP:  Myrlene Broker, MD   CC: Chest pain   HISTORY OF PRESENT ILLNESS: Andre Jimenez is a 79 y.o. male who presents for evaluation of severe aortic stenosis, referred by Dr Quillian Quince.  The patient has been seen in formal surgical consultation by Dr. Roxy Manns and he presents today for PCI of the LAD to treat severe proximal vessel stenosis.  The patient's comorbid medical conditions include aortic stenosis, coronary artery disease, hypertension, mixed hyperlipidemia, and type 2 diabetes.  The patient reports a longstanding heart murmur since he was a young man.  He has been physically active throughout most of his life until this past winter when he describes the onset of exertional angina and shortness of breath.  He complains of substernal chest discomfort that radiates to the left shoulder and arm with any strenuous activity.  He noticed this initially when pushing a wheelbarrow or walking in the woods.  Symptoms are relieved by rest.  He has not required sublingual nitroglycerin.  He has not had resting chest discomfort.  The patient underwent formal cardiac evaluation and an echocardiogram demonstrated findings consistent with severe aortic stenosis with a peak transaortic velocity of 3.8 m/s, mean transvalvular gradient of 33 mmHg, and dimensionless index of 0.24.  He then underwent cardiac catheterization confirming the presence of severe aortic stenosis with a mean transvalvular gradient of 38 mmHg and calculated aortic valve area of 0.94 cm.  He was found to have severe diffuse proximal LAD stenosis and moderately severe stenosis of the large second obtuse marginal branch of the left circumflex.  The patient was seen in surgical consultation by Dr. Roxy Manns.  He underwent CTA studies of the heart as well as the chest, abdomen, and pelvis.  After review of his  studies and further discussion with the patient, recommendations were made for PCI followed by TAVR for treatment of his coronary artery disease and aortic stenosis, respectively.  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  He has been physically active throughout his adult life.  As outlined above, he is limited by chest discomfort and shortness of breath with activity, progressive over the last 4 months.  He denies lightheadedness, syncope, heart palpitations, edema, orthopnea, or PND.  The patient has had regular dental care and reports no problems.  Past Medical History:  Diagnosis Date  . Aortic stenosis   . Bilateral carotid bruits   . Bilateral hearing loss   . Carpal tunnel syndrome on left   . Coronary artery disease   . Essential hypertension   . Exertional dyspnea   . GERD (gastroesophageal reflux disease)    WITH ESOPHAGITIS  . Gout   . Mixed hyperlipidemia   . Type II diabetes mellitus ([place])   . Vitamin D deficiency     Current Facility-Administered Medications  Medication Dose Route Frequency Provider Last Rate Last Admin  . 0.9 %  sodium chloride infusion  250 mL Intravenous PRN Rexene Alberts, MD      . 0.9% sodium chloride infusion  1 mL/kg/hr Intravenous Continuous Rexene Alberts, MD 92.5 mL/hr at 01/28/20 0735 1 mL/kg/hr at 01/28/20 0735  . aspirin chewable tablet 81 mg  81 mg Oral Hall Busing, MD      . clopidogrel (PLAVIX) tablet 75 mg  75 mg Oral Pre-Cath Darylene Price  H, MD      . sodium chloride flush (NS) 0.9 % injection 3 mL  3 mL Intravenous Q12H Rexene Alberts, MD      . sodium chloride flush (NS) 0.9 % injection 3 mL  3 mL Intravenous PRN Rexene Alberts, MD        ALLERGIES:   Patient has no known allergies.   SOCIAL HISTORY:  The patient  reports that he has never smoked. He uses smokeless tobacco.   FAMILY HISTORY:  The patient's family hx is negative for valvular heart disease or premature  CAD.  REVIEW OF SYSTEMS:  Negative except as per HPI  PHYSICAL EXAM: VS:  BP (!) 145/61   Pulse 63   Temp 97.7 F (36.5 C) (Skin)   Resp 18   Ht 5\' 7"  (1.702 m)   Wt 90.7 kg   SpO2 98%   BMI 31.32 kg/m  , BMI Body mass index is 31.32 kg/m. GEN: Well nourished, well developed, in no acute distress HEENT: normal Neck: No JVD. carotids 2+ without bruits or masses Cardiac: The heart is RRR with a 3/6 harsh systolic murmur at the RUSB, A2 present. No edema. Pedal pulses 2+ = bilaterally  Respiratory:  clear to auscultation bilaterally GI: soft, nontender, nondistended, + BS MS: no deformity or atrophy Skin: warm and dry, no rash Neuro:  Strength and sensation are intact Psych: euthymic mood, full affect  EKG:  EKG from 01-28-2020 reviewed and demonstrates NSR, LVH with repolarization abnormality  RECENT LABS: 01/25/2020: BUN 17; Creatinine, Ser 1.27; Hemoglobin 13.4; Platelets 314; Potassium 4.1; Sodium 140  No results found for requested labs within last 8760 hours.   Estimated Creatinine Clearance: 52.3 mL/min (by C-G formula based on SCr of 1.27 mg/dL).   Wt Readings from Last 3 Encounters:  01/28/20 90.7 kg  01/25/20 92.5 kg  01/15/20 93 kg     CARDIAC STUDIES: Echo: SUMMARY Left ventricular systolic function is normal. LV ejection fraction = 55-60%. Diffuse thickening of the aortic valve with restricted cusp opening. There is moderate aortic stenosis. Aortic valve mean pressure gradient is 33 mmHg. There is mild aortic regurgitation. There is mild mitral regurgitation. No old echo for comparison - FINDINGS: LEFT VENTRICLE The left ventricular size is normal. There is borderline concentric left ventricular hypertrophy. LV ejection fraction = 55-60%. Left ventricular systolic function is normal. Left ventricular filling pattern is prolonged relaxation. The left ventricular wall motion is normal.  - RIGHT VENTRICLE The right ventricle is normal in size and  function.  LEFT ATRIUM The left atrium is mildly dilated.  RIGHT ATRIUM Right atrial size is normal. - AORTIC VALVE Diffuse thickening of the aortic valve with restricted cusp opening. There is moderate aortic stenosis. Aortic valve mean pressure gradient is 33 mmHg. There is mild aortic regurgitation. - MITRAL VALVE There is mild mitral annular calcification. There is mild mitral regurgitation. - TRICUSPID VALVE The tricuspid valve is normal in structure and function. No tricuspid regurgitation. - PULMONIC VALVE The pulmonic valve is normal in structure and function. - ARTERIES The aortic sinus is normal size. - VENOUS IVC size was normal. - EFFUSION There is no pericardial effusion. There is no pleural effusion.  MMode/2D Measurements & Calculations IVSd: 1.1 cm    LA dim: 4.5 cm   ESV(MOD-sp4):LVOT diam: 2.0 cm LVIDd: 5.4 cm   EDV(MOD-sp4):   64.7 ml LVPWd: 1.1 cm   147.0 ml LVIDs: 3.8 cm     _______________________________________________________________________ SV(MOD-sp4):  EF A4C: 56.0 %   LA ESV (BP): LA ESV Index (A2C): 82.3 ml                73.2 ml   35.0 ml/m2 SI(MOD-sp4): 39.9 ml/m2     _______________________________________________________________________ LA ESV Index (A4C):LA ESV Index (BP): SV A4C: 34.8 ml/m2     35.5 ml/m2     82.3 ml  Time Measurements MM R-R int: 1.0 sec  Doppler Measurements & Calculations MV E max vel:   MV dec time:SV(LVOT): 68.3 ml   AI max vel: 114.0 cm/sec   0.27 sec  Ao V2 max:      372.6 cm/sec MV A max vel:         372.9 cm/sec     AI dec slope: 125.6 cm/sec         Ao max PG: 55.7 mmHg 216.5 cm/sec2 MV E/A: 0.91         Ao V2 mean:      AI P1/2t: Med Peak E' Vel:       265.9 cm/sec     503.9 msec 3.7 cm/sec          Ao mean PG: 31.5 mmHg Lat Peak E' Vel:       Ao V2 VTI: 90.2  cm 3.5 cm/sec          AVA (VTI): 0.76 cm2 E/Lat E`: 32.7 E/Med E`: 30.8     _______________________________________________________________________ LV V1 VTI:    PA max PG: AS Dimensionless   AVAi(VTI) 22.1 cm      2.6 mmHg  Index (VTI): 0.24                           cm^2/m^2: 0.37 cm2     _______________________________________________________________________ SV index(LVOT): 33.1 ml/m2  Cardiac Cath: SITE:  1. Right radial artery. 2. Right cephalic vein. 3. Right femoral artery (sheathless)  INDICATIONS: Angina and Valvular disease: AS  PROCEDURE: Coronary angiography, Left heart cath, LV gram, Right heart  cath  DIAGNOSTIC FINDINGS:   1. There is a focal severe stenosis of the mid LAD (abnormal by IFR). 2. There is a focal borderline stenosis of the second obtuse marginal. 3. Otherwise moderate, nonobstructive CAD. 4. Moderately reduced LV systolic function, EF [id number]. Global hypokinesis. 5. Severe aortic stenosis. Mean gradient 38 mmHg, aortic valve area 0.94  cm. 6. Mildly elevated LVEDP, 18 mmHg. 7. Mild pulmonary hypertension, 42/15 mmHg.    COMPLICATIONS: None  RECOMMENDATIONS:   Outpatient cardiothoracic surgery consult to discuss surgical aortic  valve replacement plus CABG versus transcatheter aortic valve replacement  plus stenting. In my opinion, either procedure would be reasonable for  this patient, who seems to be at intermediate risk for open heart surgery.  After we have input from cardiothoracic surgery, we will proceed with a  long-term plan to manage the patient's 3 cardiac issues: Heart failure,  aortic stenosis, and coronary artery disease.  STS RISK CALCULATOR: Procedure: AVR + CAB Risk of Mortality: 2.247% Renal Failure: 2.762% Permanent Stroke: 1.928% Prolonged Ventilation: 7.830% DSW Infection: 0.260% Reoperation: 2.910% Morbidity or Mortality: 12.972% Short Length  of Stay: 31.701% Long Length of Stay: 6.837%  ____________________  [place]  [id number] 01/28/2020  1 a. Ability to shower/bathe Not at all limited  1 b. Ability to walk 1 block Not at all limited  1 c. Ability to hurry/jog Slightly limited  2. Edema feet/ankles/legs Never over the past 2 weeks  3. Limited by fatigue 1-2 times a week  4. Limited by dyspnea At least once a day  5. Sitting up / on 3+ pillows Never over the past 2 weeks  6. Limited enjoyment of life Not limited at all  7. Rest of life w/ symptoms Somewhat satisfied  8 a. Participation in hobbies Did not limit at all  8 b. Participation in chores Did not limit at all  8 c. Visiting family/friends Did not limit at all      ASSESSMENT AND PLAN: 79 year old gentleman with severe, stage D1 aortic stenosis and severe single-vessel coronary artery disease.  Patient is limited by CCS/NYHA functional class II-III symptoms of exertional angina and dyspnea.  He has now been evaluated by a multidisciplinary team of specialists including formal cardiac surgical consultation.  After discussion of treatment options, he has elected to proceed with PCI followed by TAVR.  The patient will undergo PCI of the LAD and pressure wire analysis of moderately severe stenosis of the obtuse marginal branch.  As long as his procedure is uncomplicated, he is scheduled for TAVR in the next few weeks.  Risks, indications, and alternatives to cardiac catheterization and PCI as well as TAVR are reviewed with the patient this morning.  I have spoken with his wife at length.  All of their questions are answered.  I have personally reviewed outside echo and cardiac catheterization images and agree with the treatment plan outlined by Dr. Roxy Manns in his recent consultation and follow-up visit.  Deatra James 01/28/2020 7:30 AM     [facility] [address] [place] [address]  [phone]  (office) [phone] (fax)

## 2020-01-29 ENCOUNTER — Other Ambulatory Visit: Payer: Self-pay

## 2020-01-29 DIAGNOSIS — I350 Nonrheumatic aortic (valve) stenosis: Secondary | ICD-10-CM

## 2020-02-01 ENCOUNTER — Encounter: Payer: Self-pay | Admitting: Physician Assistant

## 2020-02-02 ENCOUNTER — Encounter: Payer: Self-pay | Admitting: Thoracic Surgery (Cardiothoracic Vascular Surgery)

## 2020-02-02 ENCOUNTER — Other Ambulatory Visit: Payer: Self-pay

## 2020-02-02 DIAGNOSIS — I350 Nonrheumatic aortic (valve) stenosis: Secondary | ICD-10-CM

## 2020-02-05 ENCOUNTER — Other Ambulatory Visit: Payer: Self-pay

## 2020-02-05 ENCOUNTER — Ambulatory Visit (HOSPITAL_COMMUNITY)
Admission: RE | Admit: 2020-02-05 | Discharge: 2020-02-05 | Disposition: A | Discharge status: Home / Self Care | Payer: Medicare Other | Source: Ambulatory Visit | Attending: Cardiovascular Disease | Admitting: Cardiovascular Disease

## 2020-02-05 ENCOUNTER — Other Ambulatory Visit (HOSPITAL_COMMUNITY): Payer: Medicare Other

## 2020-02-05 ENCOUNTER — Encounter (HOSPITAL_COMMUNITY): Payer: Medicare Other

## 2020-02-05 ENCOUNTER — Encounter (HOSPITAL_COMMUNITY): Payer: Self-pay

## 2020-02-05 ENCOUNTER — Encounter (HOSPITAL_COMMUNITY)
Admit: 2020-02-05 | Discharge: 2020-02-05 | Disposition: A | Discharge status: Home / Self Care | Payer: Medicare Other | Attending: Cardiovascular Disease | Admitting: Cardiovascular Disease

## 2020-02-05 ENCOUNTER — Other Ambulatory Visit (HOSPITAL_COMMUNITY)
Admission: RE | Admit: 2020-02-05 | Discharge: 2020-02-05 | Disposition: A | Discharge status: Home / Self Care | Payer: Medicare Other | Source: Ambulatory Visit | Attending: Cardiovascular Disease | Admitting: Cardiovascular Disease

## 2020-02-05 ENCOUNTER — Ambulatory Visit: Payer: Medicare Other | Attending: Cardiovascular Disease | Admitting: Physical Therapy

## 2020-02-05 ENCOUNTER — Encounter: Payer: Self-pay | Admitting: Physical Therapy

## 2020-02-05 ENCOUNTER — Inpatient Hospital Stay (HOSPITAL_COMMUNITY): Admit: 2020-02-05 | Payer: Medicare Other

## 2020-02-05 DIAGNOSIS — I700 Atherosclerosis of aorta: Secondary | ICD-10-CM | POA: Insufficient documentation

## 2020-02-05 DIAGNOSIS — Z20822 Contact with and (suspected) exposure to covid-19: Secondary | ICD-10-CM | POA: Insufficient documentation

## 2020-02-05 DIAGNOSIS — R2689 Other abnormalities of gait and mobility: Secondary | ICD-10-CM | POA: Insufficient documentation

## 2020-02-05 DIAGNOSIS — I350 Nonrheumatic aortic (valve) stenosis: Secondary | ICD-10-CM | POA: Insufficient documentation

## 2020-02-05 HISTORY — DX: Unspecified osteoarthritis, unspecified site: M19.90

## 2020-02-05 LAB — URINALYSIS, ROUTINE W REFLEX MICROSCOPIC
Bilirubin Urine: NEGATIVE
Glucose, UA: NEGATIVE mg/dL
Hgb urine dipstick: NEGATIVE
Ketones, ur: NEGATIVE mg/dL
Leukocytes,Ua: NEGATIVE
Nitrite: NEGATIVE
Protein, ur: NEGATIVE mg/dL
Specific Gravity, Urine: 1.015 (ref 1.005–1.030)
pH: 5 (ref 5.0–8.0)

## 2020-02-05 LAB — BLOOD GAS, ARTERIAL
Acid-base deficit: 1.8 mmol/L (ref 0.0–2.0)
Bicarbonate: 22.5 mmol/L (ref 20.0–28.0)
Drawn by: 421801
FIO2: 21
O2 Saturation: 98.2 %
Patient temperature: 37
pCO2 arterial: 39.2 mmHg (ref 32.0–48.0)
pH, Arterial: 7.378 (ref 7.350–7.450)
pO2, Arterial: 104 mmHg (ref 83.0–108.0)

## 2020-02-05 LAB — ABO/RH: ABO/RH(D): O POS

## 2020-02-05 LAB — CBC
HCT: 40.7 % (ref 39.0–52.0)
Hemoglobin: 13.4 g/dL (ref 13.0–17.0)
MCH: 29.9 pg (ref 26.0–34.0)
MCHC: 32.9 g/dL (ref 30.0–36.0)
MCV: 90.8 fL (ref 80.0–100.0)
Platelets: 275 10*3/uL (ref 150–400)
RBC: 4.48 MIL/uL (ref 4.22–5.81)
RDW: 12.7 % (ref 11.5–15.5)
WBC: 7.4 10*3/uL (ref 4.0–10.5)
nRBC: 0 % (ref 0.0–0.2)

## 2020-02-05 LAB — HEMOGLOBIN A1C
Hgb A1c MFr Bld: 6.7 % — ABNORMAL HIGH (ref 4.8–5.6)
Mean Plasma Glucose: 145.59 mg/dL

## 2020-02-05 LAB — COMPREHENSIVE METABOLIC PANEL
ALT: 16 U/L (ref 0–44)
AST: 16 U/L (ref 15–41)
Albumin: 4 g/dL (ref 3.5–5.0)
Alkaline Phosphatase: 83 U/L (ref 38–126)
Anion gap: 15 (ref 5–15)
BUN: 19 mg/dL (ref 8–23)
CO2: 20 mmol/L — ABNORMAL LOW (ref 22–32)
Calcium: 8.7 mg/dL — ABNORMAL LOW (ref 8.9–10.3)
Chloride: 103 mmol/L (ref 98–111)
Creatinine, Ser: 1.46 mg/dL — ABNORMAL HIGH (ref 0.61–1.24)
GFR calc Af Amer: 53 mL/min — ABNORMAL LOW (ref >60)
GFR calc non Af Amer: 46 mL/min — ABNORMAL LOW (ref >60)
Glucose, Bld: 146 mg/dL — ABNORMAL HIGH (ref 70–99)
Potassium: 4.1 mmol/L (ref 3.5–5.1)
Sodium: 138 mmol/L (ref 135–145)
Total Bilirubin: 0.9 mg/dL (ref 0.3–1.2)
Total Protein: 7.4 g/dL (ref 6.5–8.1)

## 2020-02-05 LAB — PROTIME-INR
INR: 1.1 (ref 0.8–1.2)
Prothrombin Time: 13.5 seconds (ref 11.4–15.2)

## 2020-02-05 LAB — SARS CORONAVIRUS 2 (TAT 6-24 HRS): SARS Coronavirus 2: NEGATIVE

## 2020-02-05 LAB — SURGICAL PCR SCREEN
MRSA, PCR: NEGATIVE
Staphylococcus aureus: NEGATIVE

## 2020-02-05 LAB — BRAIN NATRIURETIC PEPTIDE: B Natriuretic Peptide: 146.2 pg/mL — ABNORMAL HIGH (ref 0.0–100.0)

## 2020-02-05 LAB — TYPE AND SCREEN
ABO/RH(D): O POS
Antibody Screen: NEGATIVE

## 2020-02-05 LAB — GLUCOSE, CAPILLARY: Glucose-Capillary: 126 mg/dL — ABNORMAL HIGH (ref 70–99)

## 2020-02-05 LAB — APTT: aPTT: 40 seconds — ABNORMAL HIGH (ref 24–36)

## 2020-02-05 NOTE — Pre-Procedure Instructions (Addendum)
Andre Jimenez  02/05/2020     Your procedure is scheduled on Tuesday, April 11.  Report to [facility], Main Entrance or Entrance "A" at 48 AM              Your surgery or procedure is scheduled for 12:30 PM   Call this number if you have problems the morning of surgery: [phone]  This is the number for the Pre- Surgical Desk.   Remember:  Do not eat or drink after midnight Monday, May 10                  Take these medicines the morning of surgery with A SIP OF WATER : None             Stop Metformin after Saturday, May 8 dose. No Metformin May 9 until after surgery. Do not take Glipizide the eveing before Surgery.  1 Week prior to surgery STOP taking Aspirin Products (Goody Powder, Excedrin Migraine), Ibuprofen (Advil), Naproxen (Aleve), Vitamins and Herbal Products (ie Fish Oil).  How to Manage Your Diabetes Before and After Surgery  Why is it important to control my blood sugar before and after surgery? . Improving blood sugar levels before and after surgery helps healing and can limit problems. . A way of improving blood sugar control is eating a healthy diet by: o  Eating less sugar and carbohydrates o  Increasing activity/exercise o  Talking with your doctor about reaching your blood sugar goals . High blood sugars (greater than 180 mg/dL) can raise your risk of infections and slow your recovery, so you will need to focus on controlling your diabetes during the weeks before surgery. . Make sure that the doctor who takes care of your diabetes knows about your planned surgery including the date and location.  How do I manage my blood sugar before surgery? . Check your blood sugar at least 4 times a day, starting 2 days before surgery, to make sure that the level is not too high or low. o Check your blood sugar the morning of your surgery when you wake up and every 2 hours until you get to the Short Stay unit. . If your blood sugar is less than 70 mg/dL,  you will need to treat for low blood sugar: o Do not take insulin. o Treat a low blood sugar (less than 70 mg/dL) with  cup of clear juice (cranberry or apple), 4 glucose tablets, OR glucose gel. Recheck blood sugar in 15 minutes after treatment (to make sure it is greater than 70 mg/dL). If your blood sugar is not greater than 70 mg/dL on recheck, call [phone] o  for further instructions. . Report your blood sugar to the short stay nurse when you get to Short Stay.  . If you are admitted to the hospital after surgery: o Your blood sugar will be checked by the staff and you will probably be given insulin after surgery (instead of oral diabetes medicines) to make sure you have good blood sugar levels. o The goal for blood sugar control after surgery is 80-180 mg/dL.                 [name] Preparing For Surgery  Before surgery, you can play an important role. Because skin is not sterile, your skin needs to be as free of germs as possible. You can reduce the number of germs on your skin by washing with CHG (chlorahexidine gluconate) Soap  before surgery.  CHG is an antiseptic cleaner which kills germs and bonds with the skin to continue killing germs even after washing.    Oral Hygiene is also important to reduce your risk of infection.  Remember - BRUSH YOUR TEETH THE MORNING OF SURGERY WITH YOUR REGULAR TOOTHPASTE  Please do not use if you have an allergy to CHG or antibacterial soaps. If your skin becomes reddened/irritated stop using the CHG.  Do not shave (including legs and underarms) for at least 48 hours prior to first CHG shower. It is OK to shave your face.  Please follow these instructions carefully.   1. Shower the NIGHT BEFORE SURGERY and the MORNING OF SURGERY with CHG.   2. If you chose to wash your hair, wash your hair first as usual with your normal shampoo.  3. After you shampoo, wash your face and private area with the soap you use at home, then  rinse your hair and body thoroughly to remove the shampoo and soap.   4. Use CHG as you would any other liquid soap. You can apply CHG directly to the skin and wash gently with a scrungie or a clean washcloth.   5. Apply the CHG Soap to your body ONLY FROM THE NECK DOWN.  Do not use on open wounds or open sores. Avoid contact with your eyes, ears, mouth and genitals (private parts).    6. Wash thoroughly, paying special attention to the area where your surgery will be performed.  7. Thoroughly rinse your body with warm water from the neck down.  8. DO NOT shower/wash with your normal soap after using and rinsing off the CHG Soap.  9. Pat yourself dry with a CLEAN TOWEL.  10. Wear CLEAN PAJAMAS to bed the night before surgery, wear comfortable clothes the morning of surgery  11. Place CLEAN SHEETS on your bed the night of your first shower and DO NOT SLEEP WITH PETS.  Day of Surgery: Do not wear lotions, powders, or perfumes, or deodorant. Please wear clean clothes to the hospital/surgery center.   Remember to brush your teeth WITH YOUR REGULAR TOOTHPASTE.  Do not wear jewelry, make-up or nail polish.   Men may shave face and neck.  Do not bring valuables to the hospital.  [facility] is not responsible for any belongings or valuables.  Contacts, dentures or bridgework may not be worn into surgery.  Leave your suitcase in the car.  After surgery it may be brought to your room.  For patients admitted to the hospital, discharge time will be determined by your treatment team.  Patients discharged the day of surgery will not be allowed to drive home.   Please read over the following fact sheets that you were given. Coughing and Deep Breathing, Pain Booklet,  and Surgical Site Infections

## 2020-02-05 NOTE — Therapy (Signed)
[place], [place], [address] Phone: [phone]   Fax:  [phone]  Physical Therapy Pre-TAVR Evaluation  Patient Details  Name: KANSAS WALD MRN: [id number] Date of Birth: 08-06-1941 Referring Provider (PT): Sherren Mocha, MD   Encounter Date: 02/05/2020  PT End of Session - 02/05/20 0821    Visit Number  1    Number of Visits  1    Authorization Type  MCR A/B    PT Start Time  0750    PT Stop Time  0820    PT Time Calculation (min)  30 min    Equipment Utilized During Treatment  Gait belt    Activity Tolerance  Patient limited by fatigue    Behavior During Therapy  [facility] for tasks assessed/performed       Past Medical History:  Diagnosis Date  . Aortic stenosis   . Bilateral carotid bruits   . Bilateral hearing loss   . Carpal tunnel syndrome on left   . Coronary artery disease   . Essential hypertension   . GERD (gastroesophageal reflux disease)    WITH ESOPHAGITIS  . Gout   . Mixed hyperlipidemia   . Type II diabetes mellitus ([place])   . Vitamin D deficiency     Past Surgical History:  Procedure Laterality Date  . CORONARY STENT INTERVENTION N/A 01/28/2020   Procedure: CORONARY STENT INTERVENTION;  Surgeon: Sherren Mocha, MD;  Location: [place] [facility];  Service: Cardiovascular;  Laterality: N/A;  . INTRAVASCULAR PRESSURE WIRE/FFR STUDY N/A 01/28/2020   Procedure: INTRAVASCULAR PRESSURE WIRE/FFR STUDY;  Surgeon: Sherren Mocha, MD;  Location: [place] [facility];  Service: Cardiovascular;  Laterality: N/A;    There were no vitals filed for this visit.   Subjective Assessment - 02/05/20 0753    Subjective  He gets short of breath with exertion and he will get tightness across his chest if he tries to do anything strenuous. He had a stent put in about a week ago.    Pertinent History  History of aortic stenosis, coronary artery disease, hypertension, hyperlipidemia, and type 2  diabetes mellitus    Limitations  Walking    How long can you sit comfortably?  No limitation    How long can you stand comfortably?  10-15 minutes    How long can you walk comfortably?  5-10 minutes    Patient Stated Goals  Get heart better    Currently in Pain?  No/denies         [facility] PT Assessment - 02/05/20 0001      Assessment   Medical Diagnosis  Severe Aortic Stenosis    Referring Provider (PT)  Sherren Mocha, MD    Onset Date/Surgical Date  --   several months ago   Hand Dominance  Right    Next MD Visit  02/09/2020    Prior Therapy  Yes      Precautions   Precautions  None      Restrictions   Weight Bearing Restrictions  No      Balance Screen   Has the patient fallen in the past 6 months  Yes    How many times?  Fell in the shower a few months ago    Has the patient had a decrease in activity level because of a fear of falling?   No    Is the patient reluctant to leave their home because of a fear of falling?   No  Home Environment   Living Environment  Private residence    Living Arrangements  Spouse/significant other    Type of [place] to enter    Entrance Stairs-Number of Steps  --   "a couple"   Home Layout  Two level   basement   Alternate Level Stairs-Number of Steps  --   "single flight"     Prior Function   Level of Independence  Independent    Vocation  Retired    Leisure  [employer], fishing      Cognition   Overall Cognitive Status  Within Functional Limits for tasks assessed      Observation/Other Assessments   Observations  Patient appears in no apparent distress    Focus on Therapeutic Outcomes (FOTO)   NA      Sensation   Light Touch  Appears Intact      Posture/Postural Control   Posture Comments  Patient exhibits slight rounded shoulder and foreard head posture      ROM / Strength   AROM / PROM / Strength  AROM;Strength      AROM   Overall AROM Comments  AROM grossly WFL throughout UE/LE       Strength   Overall Strength Comments  Grossly 4+/5 MMT throughout bilateral UE/LE     Strength Assessment Site  Hand    Right/Left hand  Right;Left    Right Hand Grip (lbs)  85    Left Hand Grip (lbs)  60      Transfers   Transfers  Independent with all Transfers      Ambulation/Gait   Ambulation/Gait  Yes    Ambulation/Gait Assistance  7: Independent    Ambulation Distance (Feet)  1850 Feet    Gait Comments  Trendelenburg bilaterally       [facility] Pre-Surgical Assessment - 02/05/20 0001    5 Meter Walk Test- trial 1  4 sec    5 Meter Walk Test- trial 2  4 sec.     5 Meter Walk Test- trial 3  4 sec.    5 meter walk test average  4 sec    4 Stage Balance Test tolerated for:   10 sec.    4 Stage Balance Test Position  4    Sit To Stand Test- trial 1  8 sec.    ADL/IADL Independent with:  Bathing;Dressing;Meal prep;Finances    ADL/IADL Needs Assistance with:  [employer] work    ADL/IADL Fraility Index  Vulnerable    6 Minute Walk- Baseline  yes    BP (mmHg)  137/66    HR (bpm)  66    02 Sat (%RA)  99 %    Modified Borg Scale for Dyspnea  0- Nothing at all    Perceived Rate of Exertion (Borg)  6-    6 Minute Walk Post Test  yes    BP (mmHg)  168/71    HR (bpm)  77    02 Sat (%RA)  98 %    Modified Borg Scale for Dyspnea  3- Moderate shortness of breath or breathing difficulty    Perceived Rate of Exertion (Borg)  11- Fairly light    Aerobic Endurance Distance Walked  1850    Endurance additional comments  No breaks required              Objective measurements completed on examination: See above findings.  PT Education - 02/05/20 0755    Education Details  Energy conservation    Person(s) Educated  Patient    Methods  Explanation    Comprehension  Verbalized understanding                  Plan - 02/05/20 [id number]    Clinical Impression Statement  See below.    Personal Factors and Comorbidities  Age;Fitness;Past/Current  Experience;Time since onset of injury/illness/exacerbation    Examination-Activity Limitations  Locomotion Level    Examination-Participation Restrictions  Community Activity;Yard Work    Stability/Clinical Decision Making  Stable/Uncomplicated    Designer, jewellery  Low    Rehab Potential  Excellent    PT Frequency  One time visit    PT Treatment/Interventions  Therapeutic exercise;Gait training;Energy conservation    PT Next Visit Plan  NA    PT Home Exercise Plan  NA    Consulted and Agree with Plan of Care  Patient       Clinical Impression Statement: Pt is a 79 yo male presenting to OP PT for evaluation prior to possible TAVR surgery due to severe aortic stenosis. Pt reports onset of shortness of breath with activity approximately several months ago. Symptoms are limiting walking extended periods. Pt presents with good ROM and strength, good balance and is not at high fall risk 4 stage balance test, good walking speed and moderate aerobic endurance per 6 minute walk test. Pt ambulated a total of 1850 feet in 6 minute walk. Shortness of breath increased with 6 minute walk test. Based on the Short Physical Performance Battery, patient has a frailty rating of 12/12 with </= 5/12 considered frail.   Patient demonstrates the following deficits and impairments:  Abnormal gait, Difficulty walking, Decreased activity tolerance, Decreased endurance, Postural dysfunction  Visit Diagnosis: Other abnormalities of gait and mobility     Problem List Patient Active Problem List   Diagnosis Date Noted  . Aortic stenosis   . Coronary artery disease involving native coronary artery of native heart with angina pectoris ([place])   . Essential hypertension   . Type II diabetes mellitus ([place])   . Mixed hyperlipidemia   . Gout   . Carotid bruit   . Vitamin D deficiency   . Carpal tunnel syndrome - left   . Angina pectoris ([place])   . GERD (gastroesophageal reflux disease)   . Carpal tunnel  syndrome on left   . Bilateral carotid bruits   . Bilateral hearing loss   . Exertional dyspnea   . Coronary artery disease with exertional angina ([place])     Hilda Blades, PT, DPT, LAT, ATC 02/05/20  8:26 AM Phone: [phone] Fax: [facility] [address] [place], [place], [address] Phone: [phone]   Fax:  [phone]  Name: MANDELA ABBOT MRN: [id number] Date of Birth: [birth year]

## 2020-02-05 NOTE — Progress Notes (Addendum)
PCP - Dr. Unk Lightning  Cardiologist - Dr. Otho Perl  Chest x-ray -   EKG - 01/29/20  Stress Test - no  ECHO - no  Cardiac Cath - 01/28/2020  Sleep Study - no CPAP - no  LABS- CBC, CMP, PT, PTT, BNP, A1C, ABG, UA, PCP  ASA-continue until the morning of surgery  ERAS-no  HA1C-6.7 02/05/2020 Fasting Blood Sugar - does  Not check Checks Blood Sugar _0____ times a day  Lauren instructed patient to not take Metformin after Saturday, May 8 dose. I instructed [name] to not take Glipizide Monday evening dose. [name] does not check, he is not sure where the CBG machine is - hasn't check in years.  Anesthesia-  Pt denies having chest pain, sob, or fever at this time. All instructions explained to the pt, with a verbal understanding of the material. Pt agrees to go over the instructions while at home for a better understanding. Pt also instructed to self quarantine after being tested for COVID-19. The opportunity to ask questions was provided.

## 2020-02-08 MED ORDER — SODIUM CHLORIDE 0.9 % IV SOLN
1.5000 g | INTRAVENOUS | Status: AC
  Administered 2020-02-09: 1.5 g via INTRAVENOUS
  Filled 2020-02-08 (×2): qty 1.5

## 2020-02-08 MED ORDER — NOREPINEPHRINE 4 MG/250ML-% IV SOLN
0.0000 ug/min | INTRAVENOUS | Status: AC
  Administered 2020-02-09: 1 ug/min via INTRAVENOUS
  Administered 2020-02-09: 2 ug/min via INTRAVENOUS
  Filled 2020-02-08: qty 250

## 2020-02-08 MED ORDER — VANCOMYCIN HCL 1500 MG/300ML IV SOLN
1500.0000 mg | INTRAVENOUS | Status: AC
  Administered 2020-02-09: 1500 mg via INTRAVENOUS
  Filled 2020-02-08 (×2): qty 300

## 2020-02-08 MED ORDER — MAGNESIUM SULFATE 50 % IJ SOLN
40.0000 meq | INTRAMUSCULAR | Status: DC
  Filled 2020-02-08: qty 9.85

## 2020-02-08 MED ORDER — POTASSIUM CHLORIDE 2 MEQ/ML IV SOLN
80.0000 meq | INTRAVENOUS | Status: DC
  Filled 2020-02-08: qty 40

## 2020-02-08 MED ORDER — DEXMEDETOMIDINE HCL IN NACL 400 MCG/100ML IV SOLN
0.1000 ug/kg/h | INTRAVENOUS | Status: AC
  Administered 2020-02-09: 3 ug/kg/h via INTRAVENOUS
  Filled 2020-02-08 (×2): qty 100

## 2020-02-08 MED ORDER — SODIUM CHLORIDE 0.9 % IV SOLN
INTRAVENOUS | Status: DC
  Filled 2020-02-08: qty 30

## 2020-02-09 ENCOUNTER — Encounter (HOSPITAL_COMMUNITY): Payer: Self-pay | Admitting: Cardiovascular Disease

## 2020-02-09 ENCOUNTER — Inpatient Hospital Stay (HOSPITAL_COMMUNITY): Payer: Medicare Other

## 2020-02-09 ENCOUNTER — Encounter (HOSPITAL_COMMUNITY)
Admission: RE | Disposition: A | Discharge status: Home / Self Care | Payer: Self-pay | Source: Home / Self Care | Attending: Cardiovascular Disease

## 2020-02-09 ENCOUNTER — Inpatient Hospital Stay (HOSPITAL_COMMUNITY)
Admission: RE | Admit: 2020-02-09 | Discharge: 2020-02-12 | DRG: 266 | Disposition: A | Discharge status: Home / Self Care | Payer: Medicare Other | Attending: Cardiovascular Disease | Admitting: Cardiovascular Disease

## 2020-02-09 ENCOUNTER — Inpatient Hospital Stay (HOSPITAL_COMMUNITY): Payer: Medicare Other | Admitting: Certified Registered"

## 2020-02-09 ENCOUNTER — Other Ambulatory Visit: Payer: Self-pay | Admitting: Physician Assistant

## 2020-02-09 ENCOUNTER — Other Ambulatory Visit: Payer: Self-pay

## 2020-02-09 ENCOUNTER — Encounter (HOSPITAL_COMMUNITY): Payer: Medicare Other

## 2020-02-09 DIAGNOSIS — E872 Acidosis: Secondary | ICD-10-CM | POA: Diagnosis not present

## 2020-02-09 DIAGNOSIS — H9193 Unspecified hearing loss, bilateral: Secondary | ICD-10-CM | POA: Diagnosis present

## 2020-02-09 DIAGNOSIS — E782 Mixed hyperlipidemia: Secondary | ICD-10-CM | POA: Diagnosis not present

## 2020-02-09 DIAGNOSIS — E1165 Type 2 diabetes mellitus with hyperglycemia: Secondary | ICD-10-CM | POA: Diagnosis present

## 2020-02-09 DIAGNOSIS — K219 Gastro-esophageal reflux disease without esophagitis: Secondary | ICD-10-CM | POA: Diagnosis present

## 2020-02-09 DIAGNOSIS — G5600 Carpal tunnel syndrome, unspecified upper limb: Secondary | ICD-10-CM | POA: Diagnosis not present

## 2020-02-09 DIAGNOSIS — M21379 Foot drop, unspecified foot: Secondary | ICD-10-CM | POA: Diagnosis present

## 2020-02-09 DIAGNOSIS — Z952 Presence of prosthetic heart valve: Secondary | ICD-10-CM | POA: Diagnosis not present

## 2020-02-09 DIAGNOSIS — Z91041 Radiographic dye allergy status: Secondary | ICD-10-CM | POA: Diagnosis not present

## 2020-02-09 DIAGNOSIS — Z954 Presence of other heart-valve replacement: Secondary | ICD-10-CM | POA: Diagnosis not present

## 2020-02-09 DIAGNOSIS — T782XXA Anaphylactic shock, unspecified, initial encounter: Secondary | ICD-10-CM

## 2020-02-09 DIAGNOSIS — Z7982 Long term (current) use of aspirin: Secondary | ICD-10-CM

## 2020-02-09 DIAGNOSIS — M109 Gout, unspecified: Secondary | ICD-10-CM | POA: Diagnosis not present

## 2020-02-09 DIAGNOSIS — I779 Disorder of arteries and arterioles, unspecified: Secondary | ICD-10-CM | POA: Diagnosis present

## 2020-02-09 DIAGNOSIS — Z7984 Long term (current) use of oral hypoglycemic drugs: Secondary | ICD-10-CM

## 2020-02-09 DIAGNOSIS — Z888 Allergy status to other drugs, medicaments and biological substances status: Secondary | ICD-10-CM

## 2020-02-09 DIAGNOSIS — Z79899 Other long term (current) drug therapy: Secondary | ICD-10-CM | POA: Diagnosis not present

## 2020-02-09 DIAGNOSIS — I10 Essential (primary) hypertension: Secondary | ICD-10-CM | POA: Diagnosis present

## 2020-02-09 DIAGNOSIS — I272 Pulmonary hypertension, unspecified: Secondary | ICD-10-CM | POA: Diagnosis not present

## 2020-02-09 DIAGNOSIS — I350 Nonrheumatic aortic (valve) stenosis: Secondary | ICD-10-CM

## 2020-02-09 DIAGNOSIS — Z006 Encounter for examination for normal comparison and control in clinical research program: Secondary | ICD-10-CM

## 2020-02-09 DIAGNOSIS — I5033 Acute on chronic diastolic (congestive) heart failure: Secondary | ICD-10-CM

## 2020-02-09 DIAGNOSIS — I110 Hypertensive heart disease with heart failure: Secondary | ICD-10-CM | POA: Diagnosis present

## 2020-02-09 DIAGNOSIS — N179 Acute kidney failure, unspecified: Secondary | ICD-10-CM | POA: Diagnosis not present

## 2020-02-09 DIAGNOSIS — E119 Type 2 diabetes mellitus without complications: Secondary | ICD-10-CM

## 2020-02-09 DIAGNOSIS — I25119 Atherosclerotic heart disease of native coronary artery with unspecified angina pectoris: Secondary | ICD-10-CM | POA: Diagnosis present

## 2020-02-09 HISTORY — PX: TEE WITHOUT CARDIOVERSION: SHX5443

## 2020-02-09 HISTORY — DX: Presence of prosthetic heart valve: Z95.2

## 2020-02-09 HISTORY — PX: TRANSCATHETER AORTIC VALVE REPLACEMENT, TRANSFEMORAL: SHX6400

## 2020-02-09 HISTORY — DX: Disorder of arteries and arterioles, unspecified: I77.9

## 2020-02-09 HISTORY — DX: Nonrheumatic aortic (valve) stenosis: I35.0

## 2020-02-09 LAB — CBC
HCT: 49.5 % (ref 39.0–52.0)
Hemoglobin: 15.9 g/dL (ref 13.0–17.0)
MCH: 29.4 pg (ref 26.0–34.0)
MCHC: 32.1 g/dL (ref 30.0–36.0)
MCV: 91.5 fL (ref 80.0–100.0)
Platelets: 252 10*3/uL (ref 150–400)
RBC: 5.41 MIL/uL (ref 4.22–5.81)
RDW: 12.5 % (ref 11.5–15.5)
WBC: 11 10*3/uL — ABNORMAL HIGH (ref 4.0–10.5)
nRBC: 0 % (ref 0.0–0.2)

## 2020-02-09 LAB — COMPREHENSIVE METABOLIC PANEL
ALT: 12 U/L (ref 0–44)
AST: 19 U/L (ref 15–41)
Albumin: 2.5 g/dL — ABNORMAL LOW (ref 3.5–5.0)
Alkaline Phosphatase: 76 U/L (ref 38–126)
Anion gap: 17 — ABNORMAL HIGH (ref 5–15)
BUN: 17 mg/dL (ref 8–23)
CO2: 12 mmol/L — ABNORMAL LOW (ref 22–32)
Calcium: 6.7 mg/dL — ABNORMAL LOW (ref 8.9–10.3)
Chloride: 113 mmol/L — ABNORMAL HIGH (ref 98–111)
Creatinine, Ser: 1.51 mg/dL — ABNORMAL HIGH (ref 0.61–1.24)
GFR calc Af Amer: 51 mL/min — ABNORMAL LOW (ref >60)
GFR calc non Af Amer: 44 mL/min — ABNORMAL LOW (ref >60)
Glucose, Bld: 227 mg/dL — ABNORMAL HIGH (ref 70–99)
Potassium: 2.9 mmol/L — ABNORMAL LOW (ref 3.5–5.1)
Sodium: 142 mmol/L (ref 135–145)
Total Bilirubin: 1.4 mg/dL — ABNORMAL HIGH (ref 0.3–1.2)
Total Protein: 4.9 g/dL — ABNORMAL LOW (ref 6.5–8.1)

## 2020-02-09 LAB — BASIC METABOLIC PANEL WITH GFR
Anion gap: 23 — ABNORMAL HIGH (ref 5–15)
BUN: 22 mg/dL (ref 8–23)
CO2: 12 mmol/L — ABNORMAL LOW (ref 22–32)
Calcium: 7.8 mg/dL — ABNORMAL LOW (ref 8.9–10.3)
Chloride: 106 mmol/L (ref 98–111)
Creatinine, Ser: 2.09 mg/dL — ABNORMAL HIGH (ref 0.61–1.24)
GFR calc Af Amer: 34 mL/min — ABNORMAL LOW
GFR calc non Af Amer: 30 mL/min — ABNORMAL LOW
Glucose, Bld: 252 mg/dL — ABNORMAL HIGH (ref 70–99)
Potassium: 3.4 mmol/L — ABNORMAL LOW (ref 3.5–5.1)
Sodium: 141 mmol/L (ref 135–145)

## 2020-02-09 LAB — POCT I-STAT, CHEM 8
BUN: 18 mg/dL (ref 8–23)
BUN: 19 mg/dL (ref 8–23)
Calcium, Ion: 1.19 mmol/L (ref 1.15–1.40)
Calcium, Ion: 1.09 mmol/L — ABNORMAL LOW (ref 1.15–1.40)
Chloride: 101 mmol/L (ref 98–111)
Chloride: 104 mmol/L (ref 98–111)
Creatinine, Ser: 1.1 mg/dL (ref 0.61–1.24)
Creatinine, Ser: 1.2 mg/dL (ref 0.61–1.24)
Glucose, Bld: 145 mg/dL — ABNORMAL HIGH (ref 70–99)
Glucose, Bld: 192 mg/dL — ABNORMAL HIGH (ref 70–99)
HCT: 35 % — ABNORMAL LOW (ref 39.0–52.0)
HCT: 46 % (ref 39.0–52.0)
Hemoglobin: 11.9 g/dL — ABNORMAL LOW (ref 13.0–17.0)
Hemoglobin: 15.6 g/dL (ref 13.0–17.0)
Potassium: 4.1 mmol/L (ref 3.5–5.1)
Potassium: 4 mmol/L (ref 3.5–5.1)
Sodium: 141 mmol/L (ref 135–145)
Sodium: 138 mmol/L (ref 135–145)
TCO2: 25 mmol/L (ref 22–32)
TCO2: 21 mmol/L — ABNORMAL LOW (ref 22–32)

## 2020-02-09 LAB — GLUCOSE, CAPILLARY
Glucose-Capillary: 108 mg/dL — ABNORMAL HIGH (ref 70–99)
Glucose-Capillary: 84 mg/dL (ref 70–99)
Glucose-Capillary: 96 mg/dL (ref 70–99)
Glucose-Capillary: 93 mg/dL (ref 70–99)
Glucose-Capillary: 93 mg/dL (ref 70–99)

## 2020-02-09 LAB — POCT I-STAT 7, (LYTES, BLD GAS, ICA,H+H)
Acid-base deficit: 14 mmol/L — ABNORMAL HIGH (ref 0.0–2.0)
Bicarbonate: 12 mmol/L — ABNORMAL LOW (ref 20.0–28.0)
Calcium, Ion: 1.07 mmol/L — ABNORMAL LOW (ref 1.15–1.40)
HCT: 45 % (ref 39.0–52.0)
Hemoglobin: 15.3 g/dL (ref 13.0–17.0)
O2 Saturation: 97 %
Patient temperature: 99
Potassium: 3.2 mmol/L — ABNORMAL LOW (ref 3.5–5.1)
Sodium: 140 mmol/L (ref 135–145)
TCO2: 13 mmol/L — ABNORMAL LOW (ref 22–32)
pCO2 arterial: 28.9 mmHg — ABNORMAL LOW (ref 32.0–48.0)
pH, Arterial: 7.227 — ABNORMAL LOW (ref 7.350–7.450)
pO2, Arterial: 106 mmHg (ref 83.0–108.0)

## 2020-02-09 LAB — POCT ACTIVATED CLOTTING TIME
Activated Clotting Time: 257 seconds
Activated Clotting Time: 98 seconds

## 2020-02-09 LAB — LACTIC ACID, PLASMA: Lactic Acid, Venous: 7.2 mmol/L (ref 0.5–1.9)

## 2020-02-09 SURGERY — IMPLANTATION, AORTIC VALVE, TRANSCATHETER, FEMORAL APPROACH
Anesthesia: Monitor Anesthesia Care | Site: Chest

## 2020-02-09 MED ORDER — SODIUM CHLORIDE 0.9 % IV SOLN: INTRAVENOUS | Status: DC | PRN

## 2020-02-09 MED ORDER — CHLORHEXIDINE GLUCONATE 4 % EX LIQD: 60.0000 mL | Freq: Once | CUTANEOUS | Status: DC

## 2020-02-09 MED ORDER — MORPHINE SULFATE (PF) 2 MG/ML IV SOLN
1.0000 mg | INTRAVENOUS | Status: DC | PRN
  Administered 2020-02-10: 2 mg via INTRAVENOUS
  Filled 2020-02-09: qty 1

## 2020-02-09 MED ORDER — PHENYLEPHRINE HCL-NACL 20-0.9 MG/250ML-% IV SOLN
0.0000 ug/min | INTRAVENOUS | Status: DC
  Administered 2020-02-09: 20:00:00 20 ug/min via INTRAVENOUS
  Filled 2020-02-09: qty 250

## 2020-02-09 MED ORDER — ATORVASTATIN CALCIUM 40 MG PO TABS
40.0000 mg | ORAL_TABLET | Freq: Every day | ORAL | Status: DC
  Administered 2020-02-11 – 2020-02-12 (×2): 40 mg via ORAL
  Filled 2020-02-09 (×2): qty 1

## 2020-02-09 MED ORDER — VANCOMYCIN HCL 1500 MG/300ML IV SOLN
1500.0000 mg | INTRAVENOUS | Status: DC
  Filled 2020-02-09: qty 300

## 2020-02-09 MED ORDER — DEXMEDETOMIDINE HCL IN NACL 400 MCG/100ML IV SOLN
0.1000 ug/kg/h | INTRAVENOUS | Status: DC
  Filled 2020-02-09: qty 100

## 2020-02-09 MED ORDER — SODIUM CHLORIDE 0.9% FLUSH
3.0000 mL | Freq: Two times a day (BID) | INTRAVENOUS | Status: DC
  Administered 2020-02-10 – 2020-02-11 (×3): 3 mL via INTRAVENOUS

## 2020-02-09 MED ORDER — SODIUM CHLORIDE 0.9 % IV SOLN
INTRAVENOUS | Status: DC
  Filled 2020-02-09: qty 30

## 2020-02-09 MED ORDER — CHLORHEXIDINE GLUCONATE 0.12 % MT SOLN
15.0000 mL | Freq: Once | OROMUCOSAL | Status: AC
  Administered 2020-02-09: 13:00:00 15 mL via OROMUCOSAL

## 2020-02-09 MED ORDER — METHYLPREDNISOLONE SODIUM SUCC 125 MG IJ SOLR
125.0000 mg | INTRAMUSCULAR | Status: AC
  Administered 2020-02-09: 19:00:00 125 mg via INTRAVENOUS

## 2020-02-09 MED ORDER — NOREPINEPHRINE 4 MG/250ML-% IV SOLN
0.0000 ug/min | INTRAVENOUS | Status: DC
  Filled 2020-02-09: qty 250

## 2020-02-09 MED ORDER — CHLORHEXIDINE GLUCONATE 0.12 % MT SOLN
OROMUCOSAL | Status: AC
  Filled 2020-02-09: qty 15

## 2020-02-09 MED ORDER — DIPHENHYDRAMINE HCL 50 MG/ML IJ SOLN
INTRAMUSCULAR | Status: AC
  Filled 2020-02-09: qty 1

## 2020-02-09 MED ORDER — DEXMEDETOMIDINE HCL IN NACL 400 MCG/100ML IV SOLN
0.4000 ug/kg/h | INTRAVENOUS | Status: DC
  Administered 2020-02-09: 0.4 ug/kg/h via INTRAVENOUS
  Administered 2020-02-10: 07:00:00 1.2 ug/kg/h via INTRAVENOUS
  Administered 2020-02-10: 03:00:00 0.8 ug/kg/h via INTRAVENOUS
  Filled 2020-02-09 (×3): qty 100

## 2020-02-09 MED ORDER — LIDOCAINE HCL 1 % IJ SOLN
INTRAMUSCULAR | Status: DC | PRN
  Administered 2020-02-09 (×2): 5 mL via INTRADERMAL

## 2020-02-09 MED ORDER — PANTOPRAZOLE SODIUM 40 MG IV SOLR
40.0000 mg | INTRAVENOUS | Status: DC
  Administered 2020-02-09 – 2020-02-11 (×3): 40 mg via INTRAVENOUS
  Filled 2020-02-09 (×3): qty 40

## 2020-02-09 MED ORDER — CLOPIDOGREL BISULFATE 75 MG PO TABS
75.0000 mg | ORAL_TABLET | Freq: Every day | ORAL | Status: DC
  Administered 2020-02-11 – 2020-02-12 (×2): 75 mg via ORAL
  Filled 2020-02-09 (×2): qty 1

## 2020-02-09 MED ORDER — SODIUM CHLORIDE 0.9 % IV SOLN
INTRAVENOUS | Status: DC | PRN
  Administered 2020-02-09 (×2): 500 mL via INTRAMUSCULAR

## 2020-02-09 MED ORDER — PANTOPRAZOLE SODIUM 40 MG PO TBEC: 40.0000 mg | DELAYED_RELEASE_TABLET | Freq: Every day | ORAL | Status: DC

## 2020-02-09 MED ORDER — NITROGLYCERIN IN D5W 200-5 MCG/ML-% IV SOLN: 0.0000 ug/min | INTRAVENOUS | Status: DC

## 2020-02-09 MED ORDER — INSULIN ASPART 100 UNIT/ML ~~LOC~~ SOLN
0.0000 [IU] | Freq: Three times a day (TID) | SUBCUTANEOUS | Status: DC
  Administered 2020-02-10: 07:00:00 20 [IU] via SUBCUTANEOUS

## 2020-02-09 MED ORDER — SODIUM CHLORIDE 0.9 % IV SOLN: 250.0000 mL | INTRAVENOUS | Status: DC | PRN

## 2020-02-09 MED ORDER — EPINEPHRINE HCL 5 MG/250ML IV SOLN IN NS
0.5000 ug/min | INTRAVENOUS | Status: DC
  Administered 2020-02-09: 5 ug/min via INTRAVENOUS
  Filled 2020-02-09 (×2): qty 250

## 2020-02-09 MED ORDER — TRAMADOL HCL 50 MG PO TABS
50.0000 mg | ORAL_TABLET | ORAL | Status: DC | PRN
  Administered 2020-02-11 (×2): 50 mg via ORAL
  Filled 2020-02-09 (×2): qty 1

## 2020-02-09 MED ORDER — FENTANYL CITRATE (PF) 100 MCG/2ML IJ SOLN: INTRAMUSCULAR | Status: DC | PRN

## 2020-02-09 MED ORDER — SODIUM CHLORIDE 0.9 % IV SOLN
1.5000 g | Freq: Two times a day (BID) | INTRAVENOUS | Status: AC
  Administered 2020-02-10 – 2020-02-11 (×4): 1.5 g via INTRAVENOUS
  Filled 2020-02-09 (×4): qty 1.5

## 2020-02-09 MED ORDER — POTASSIUM CHLORIDE 10 MEQ/100ML IV SOLN
10.0000 meq | INTRAVENOUS | Status: AC
  Administered 2020-02-09 – 2020-02-10 (×4): 10 meq via INTRAVENOUS
  Filled 2020-02-09 (×4): qty 100

## 2020-02-09 MED ORDER — SODIUM CHLORIDE 0.9 % IV SOLN
1.5000 g | INTRAVENOUS | Status: DC
  Filled 2020-02-09: qty 1.5

## 2020-02-09 MED ORDER — CALCIUM GLUCONATE-NACL 1-0.675 GM/50ML-% IV SOLN
1.0000 g | Freq: Once | INTRAVENOUS | Status: AC
  Administered 2020-02-09: 1000 mg via INTRAVENOUS
  Filled 2020-02-09: qty 50

## 2020-02-09 MED ORDER — IOHEXOL 350 MG/ML SOLN
INTRAVENOUS | Status: DC | PRN
  Administered 2020-02-09: 28 mL via INTRA_ARTERIAL

## 2020-02-09 MED ORDER — EPINEPHRINE 1 MG/10ML IJ SOSY
PREFILLED_SYRINGE | INTRAMUSCULAR | Status: DC | PRN
Start: 2020-02-09 — End: 2020-02-09
  Administered 2020-02-09: .05 mg via INTRAVENOUS
  Administered 2020-02-09 (×4): .1 mg via INTRAVENOUS

## 2020-02-09 MED ORDER — LACTATED RINGERS IV SOLN: INTRAVENOUS | Status: DC | PRN

## 2020-02-09 MED ORDER — SODIUM CHLORIDE 0.9 % IV SOLN: INTRAVENOUS | Status: AC

## 2020-02-09 MED ORDER — PROTAMINE SULFATE 10 MG/ML IV SOLN
INTRAVENOUS | Status: DC | PRN
Start: 2020-02-09 — End: 2020-02-09
  Administered 2020-02-09: 10 mg via INTRAVENOUS
  Administered 2020-02-09: 120 mg via INTRAVENOUS

## 2020-02-09 MED ORDER — POTASSIUM CHLORIDE 2 MEQ/ML IV SOLN
80.0000 meq | INTRAVENOUS | Status: DC
  Filled 2020-02-09: qty 40

## 2020-02-09 MED ORDER — MAGNESIUM SULFATE 50 % IJ SOLN
40.0000 meq | INTRAMUSCULAR | Status: DC
  Filled 2020-02-09: qty 9.85

## 2020-02-09 MED ORDER — OXYCODONE HCL 5 MG PO TABS: 5.0000 mg | ORAL_TABLET | ORAL | Status: DC | PRN

## 2020-02-09 MED ORDER — VANCOMYCIN HCL IN DEXTROSE 1-5 GM/200ML-% IV SOLN
1000.0000 mg | Freq: Once | INTRAVENOUS | Status: AC
  Administered 2020-02-10: 06:00:00 1000 mg via INTRAVENOUS
  Filled 2020-02-09: qty 200

## 2020-02-09 MED ORDER — ONDANSETRON HCL 4 MG/2ML IJ SOLN: 4.0000 mg | Freq: Four times a day (QID) | INTRAMUSCULAR | Status: DC | PRN

## 2020-02-09 MED ORDER — DIPHENHYDRAMINE HCL 50 MG/ML IJ SOLN
50.0000 mg | Freq: Once | INTRAMUSCULAR | Status: AC
  Administered 2020-02-09: 50 mg via INTRAVENOUS

## 2020-02-09 MED ORDER — ASPIRIN EC 81 MG PO TBEC
81.0000 mg | DELAYED_RELEASE_TABLET | Freq: Every day | ORAL | Status: DC
  Administered 2020-02-11 – 2020-02-12 (×2): 81 mg via ORAL
  Filled 2020-02-09 (×2): qty 1

## 2020-02-09 MED ORDER — SODIUM BICARBONATE 8.4 % IV SOLN
50.0000 meq | Freq: Once | INTRAVENOUS | Status: AC
  Administered 2020-02-09: 22:00:00 50 meq via INTRAVENOUS
  Filled 2020-02-09: qty 50

## 2020-02-09 MED ORDER — SODIUM CHLORIDE 0.9% FLUSH: 3.0000 mL | INTRAVENOUS | Status: DC | PRN

## 2020-02-09 MED ORDER — HEPARIN SODIUM (PORCINE) 1000 UNIT/ML IJ SOLN
INTRAMUSCULAR | Status: DC | PRN
  Administered 2020-02-09: 13000 [IU] via INTRAVENOUS

## 2020-02-09 MED ORDER — CHLORHEXIDINE GLUCONATE 4 % EX LIQD: 30.0000 mL | CUTANEOUS | Status: DC

## 2020-02-09 MED ORDER — LIDOCAINE 2% (20 MG/ML) 5 ML SYRINGE
INTRAMUSCULAR | Status: DC | PRN
Start: 2020-02-09 — End: 2020-02-09
  Administered 2020-02-09: 50 mg via INTRAVENOUS

## 2020-02-09 MED ORDER — POTASSIUM CHLORIDE ER 10 MEQ PO TBCR: 40.0000 meq | EXTENDED_RELEASE_TABLET | Freq: Once | ORAL | Status: DC

## 2020-02-09 MED ORDER — ACETAMINOPHEN 650 MG RE SUPP: 650.0000 mg | Freq: Four times a day (QID) | RECTAL | Status: DC | PRN

## 2020-02-09 MED ORDER — SODIUM CHLORIDE 0.9 % IV SOLN: INTRAVENOUS | Status: DC

## 2020-02-09 MED ORDER — ACETAMINOPHEN 325 MG PO TABS
650.0000 mg | ORAL_TABLET | Freq: Four times a day (QID) | ORAL | Status: DC | PRN
  Administered 2020-02-11 (×2): 650 mg via ORAL
  Filled 2020-02-09 (×2): qty 2

## 2020-02-09 SURGICAL SUPPLY — 41 items
BAG SNAP BAND KOVER 36X36 (MISCELLANEOUS) ×8 IMPLANT
BLANKET WARM UNDERBOD FULL ACC (MISCELLANEOUS) ×4 IMPLANT
CABLE ADAPT PACING TEMP 12FT (ADAPTER) ×4 IMPLANT
CATH DIAG 6FR PIGTAIL ANGLED (CATHETERS) ×4 IMPLANT
CATH INFINITI 5 FR STR PIGTAIL (CATHETERS) ×4 IMPLANT
CATH INFINITI 6F AL2 (CATHETERS) ×4 IMPLANT
CATH S G BIP PACING (CATHETERS) ×4 IMPLANT
CLOSURE MYNX CONTROL 6F/7F (Vascular Products) ×4 IMPLANT
DEVICE CLOSURE PERCLS PRGLD 6F (VASCULAR PRODUCTS) ×4 IMPLANT
DRYSEAL FLEXSHEATH 14FR 33CM (SHEATH) ×2
DRYSEAL FLEXSHEATH 18FR 33CM (SHEATH) ×2
ELECT DEFIB PAD ADLT CADENCE (PAD) ×4 IMPLANT
GUIDEWIRE CNFDA BRKR CVD (WIRE) ×4 IMPLANT
KIT DILATOR VASC 18G NDL (KITS) ×4 IMPLANT
KIT HEART LEFT (KITS) ×4 IMPLANT
KIT MICROPUNCTURE NIT STIFF (SHEATH) ×4 IMPLANT
PACK CARDIAC CATHETERIZATION (CUSTOM PROCEDURE TRAY) ×4 IMPLANT
PERCLOSE PROGLIDE 6F (VASCULAR PRODUCTS) ×8
SHEATH BRITE TIP 7FR 35CM (SHEATH) ×4 IMPLANT
SHEATH DRYSEAL FLEX 14FR 33CM (SHEATH) ×2 IMPLANT
SHEATH DRYSEAL FLEX 18FR 33CM (SHEATH) ×2 IMPLANT
SHEATH PINNACLE 6F 10CM (SHEATH) ×4 IMPLANT
SHEATH PINNACLE 8F 10CM (SHEATH) ×4 IMPLANT
SHEATH PROBE COVER 6X72 (BAG) ×4 IMPLANT
SHIELD RADPAD SCOOP 12X17 (MISCELLANEOUS) ×4 IMPLANT
SLEEVE REPOSITIONING LENGTH 30 (MISCELLANEOUS) ×4 IMPLANT
STOPCOCK MORSE 400PSI 3WAY (MISCELLANEOUS) ×8 IMPLANT
SYR MEDRAD MARK V 150ML (SYRINGE) ×4 IMPLANT
SYS DEL EVOLUT PROPLS 23 26 29 (CATHETERS) ×4
SYS LOAD EVOLT PROPLS 23 26 29 (CATHETERS) ×4
SYSTEM DEL EVLT PRPLS 23 26 29 (CATHETERS) ×2 IMPLANT
SYSTEM LOAD EVLT PRPLS23 26 29 (CATHETERS) ×2 IMPLANT
TRANSDUCER W/STOPCOCK (MISCELLANEOUS) ×8 IMPLANT
TUBE CONN 8.8X1320 FR HP M-F (CONNECTOR) ×4 IMPLANT
TUBING CIL FLEX 10 FLL-RA (TUBING) ×4 IMPLANT
VALVE AORTIC EVOLUT PROPLUS 29 (Valve) ×4 IMPLANT
WIRE AMPLATZ SS-J .035X180CM (WIRE) ×4 IMPLANT
WIRE EMERALD 3MM-J .035X150CM (WIRE) ×4 IMPLANT
WIRE EMERALD 3MM-J .035X260CM (WIRE) ×4 IMPLANT
WIRE EMERALD ST .035X260CM (WIRE) ×4 IMPLANT
WIRE HI TORQ VERSACORE-J 145CM (WIRE) ×4 IMPLANT

## 2020-02-09 NOTE — Progress Notes (Signed)
Pt arrived to the unit BP in 70s. On Levo. Drowsy, some confusion. Does not follow commands. Constantly moving. RT femoral site started to bleed. MD at the bedside, held pressure.  At this time Neo, Epi and fluids are on board. VS stable

## 2020-02-09 NOTE — Anesthesia Preprocedure Evaluation (Addendum)
Anesthesia Evaluation  Patient identified by MRN, date of birth, ID band Patient awake    Reviewed: Allergy & Precautions, NPO status , Patient's Chart, lab work & pertinent test results  Airway Mallampati: II  TM Distance: >3 FB Neck ROM: Full    Dental no notable dental hx. (+) Dental Advisory Given   Pulmonary former smoker,    Pulmonary exam normal breath sounds clear to auscultation       Cardiovascular hypertension, + angina + CAD  Normal cardiovascular exam+ Valvular Problems/Murmurs AS  Rhythm:Regular Rate:Normal     Neuro/Psych negative psych ROS   GI/Hepatic Neg liver ROS, GERD  ,  Endo/Other  diabetes  Renal/GU negative Renal ROS     Musculoskeletal  (+) Arthritis ,   Abdominal   Peds  Hematology negative hematology ROS (+)   Anesthesia Other Findings   Reproductive/Obstetrics negative OB ROS                            Anesthesia Physical Anesthesia Plan  ASA: IV  Anesthesia Plan: MAC   Post-op Pain Management:    Induction: Intravenous  PONV Risk Score and Plan: 1 and Propofol infusion and Treatment may vary due to age or medical condition  Airway Management Planned: Natural Airway  Additional Equipment: Arterial line  Intra-op Plan:   Post-operative Plan:   Informed Consent: I have reviewed the patients History and Physical, chart, labs and discussed the procedure including the risks, benefits and alternatives for the proposed anesthesia with the patient or authorized representative who has indicated his/her understanding and acceptance.     Dental advisory given  Plan Discussed with: CRNA  Anesthesia Plan Comments:         Anesthesia Quick Evaluation

## 2020-02-09 NOTE — Interval H&P Note (Signed)
History and Physical Interval Note:  02/09/2020 7:03 PM  Andre Jimenez  has presented today for surgery, with the diagnosis of Severe Aortic Stenosis.  The various methods of treatment have been discussed with the patient and family. After consideration of risks, benefits and other options for treatment, the patient has consented to  Procedure(s): TRANSCATHETER AORTIC VALVE REPLACEMENT, LEFT TRANSFEMORAL (Left) TRANSESOPHAGEAL ECHOCARDIOGRAM (TEE) (N/A) as a surgical intervention.  The patient's history has been reviewed, patient examined, no change in status, stable for surgery.  I have reviewed the patient's chart and labs.  Questions were answered to the patient's satisfaction.     Sherren Mocha

## 2020-02-09 NOTE — Discharge Instructions (Signed)
ACTIVITY AND EXERCISE °• Daily activity and exercise are an important part of your recovery. People recover at different rates depending on their general health and type of valve procedure. °• Most people recovering from TAVR feel better relatively quickly  °• No lifting, pushing, pulling more than 10 pounds (examples to avoid: groceries, vacuuming, gardening, golfing): °            - For one week with a procedure through the groin. °            - For six weeks for procedures through the chest wall or neck. °NOTE: You will typically see one of our providers 7-14 days after your procedure to discuss WHEN TO RESUME the above activities.  °  °  °DRIVING °• Do not drive until you are seen for follow up and cleared by a provider. Generally, we ask patient to not drive for 1 week after their procedure. °• If you have been told by your doctor in the past that you may not drive, you must talk with him/her before you begin driving again. °  °DRESSING °• Groin site: you may leave the clear dressing over the site for up to one week or until it falls off. °  °HYGIENE °• If you had a femoral (leg) procedure, you may take a shower when you return home. After the shower, pat the site dry. Do NOT use powder, oils or lotions in your groin area until the site has completely healed. °• If you had a chest procedure, you may shower when you return home unless specifically instructed not to by your discharging practitioner. °            - DO NOT scrub incision; pat dry with a towel. °            - DO NOT apply any lotions, oils, powders to the incision. °            - No tub baths / swimming for at least 2 weeks. °• If you notice any fevers, chills, increased pain, swelling, bleeding or pus, please contact your doctor. °  °ADDITIONAL INFORMATION °• If you are going to have an upcoming dental procedure, please contact our office as you will require antibiotics ahead of time to prevent infection on your heart valve.  ° ° °If you have any  questions or concerns you can call the structural heart phone during normal business hours 8am-4pm. If you have an urgent need after hours or weekends please call [phone] to talk to the on call provider for general cardiology. If you have an emergency that requires immediate attention, please call 911.  ° ° °After TAVR Checklist ° °Check  Test Description  ° Follow up appointment in 1-2 weeks  You will see our structural heart physician assistant, Andre Jimenez. Your incision sites will be checked and you will be cleared to drive and resume all normal activities if you are doing well.    ° 1 month echo and follow up  You will have an echo to check on your new heart valve and be seen back in the office by Andre Jimenez. Many times the echo is not read by your appointment time, but Andre will call you later that day or the following day to report your results.  ° Follow up with your primary cardiologist You will need to be seen by your primary cardiologist in the following 3-6 months after your 1 month appointment in the valve   clinic. Often times your Plavix or Aspirin will be discontinued during this time, but this is decided on a case by case basis.   ° 1 year echo and follow up You will have another echo to check on your heart valve after 1 year and be seen back in the office by Andre Jimenez. This your last structural heart visit.  ° Bacterial endocarditis prophylaxis  You will have to take antibiotics for the rest of your life before all dental procedures (even teeth cleanings) to protect your heart valve. Antibiotics are also required before some surgeries. Please check with your cardiologist before scheduling any surgeries. Also, please make sure to tell us if you have a penicillin allergy as you will require an alternative antibiotic.   ° ° °

## 2020-02-09 NOTE — Anesthesia Procedure Notes (Signed)
Arterial Line Insertion Start/End5/08/2020 4:50 PM, 02/09/2020 5:00 PM Performed by: Moshe Salisbury, CRNA, CRNA  Patient location: O[facility] procedure area. Preanesthetic checklist: patient identified, IV checked, site marked, risks and benefits discussed, surgical consent, monitors and equipment checked, pre-op evaluation, timeout performed and anesthesia consent Lidocaine 1% used for infiltration and patient sedated Left, radial was placed Catheter size: 20 G Hand hygiene performed  and maximum sterile barriers used   Attempts: 1 Procedure performed without using ultrasound guided technique. Following insertion, dressing applied and Biopatch. Post procedure assessment: normal  Patient tolerated the procedure well with no immediate complications.

## 2020-02-09 NOTE — Op Note (Signed)
[facility]ART AND [facility]ENTER   MULTIDISCIPLINARY HEART VALVE TEAM   TAVR OPERATIVE NOTE   Date of Procedure:  02/09/2020  Preoperative Diagnosis: Severe Aortic Stenosis   Postoperative Diagnosis: Same   Procedure:    Transcatheter Aortic Valve Replacement - Percutaneous Left Transfemoral Approach  Medtronic CoreValve Evolut Pro (size 29 mm, serial # [id number])   Co-Surgeons:  Valentina Gu. Roxy Manns, MD and Sherren Mocha, MD  Anesthesiologist:  Nolon Nations, MD  Echocardiographer:  Sanda Klein, MD  Pre-operative Echo Findings:  Severe aortic stenosis  Normal left ventricular systolic function  Post-operative Echo Findings:  No paravalvular leak  Normal left ventricular systolic function   BRIEF CLINICAL NOTE AND INDICATIONS FOR SURGERY  Patient is a 79 year old male with history of aortic stenosis, coronary artery disease, hypertension, hyperlipidemia, and type 2 diabetes mellitus who has been referred for surgical consultation to discuss treatment options for management of severe symptomatic aortic stenosis and coronary artery disease with exertional angina.  Patient states that he has been told that he had a heart murmur all of his life.  He has remained physically active and without significant limitation until recently.  He describes a gradual onset of symptoms of exertional shortness of breath that occur typically only with more strenuous physical exertion.  Several months ago the patient began to experience substernal chest tightness with radiation to the left shoulder and left arm that occur with strenuous physical exertion.  This has become more frequent and reproducible, although patient states that symptoms only develop with more strenuous activity and are always relieved by rest.  He was referred for formal cardiology consultation and has been evaluated previously by Dr. Otho Perl at [facility].  By report Baseline twelve-lead EKG revealed sinus  rhythm with no significant AV conduction delay but findings suggestive of previous anteroseptal myocardial infarction.  Transthoracic echocardiogram performed December 08, 2019 revealed normal left ventricular systolic function with at least moderate aortic stenosis.  Peak velocity across the aortic valve measured 3.8 m/s corresponding to mean transvalvular gradient estimated 33 mmHg.  There was mild aortic insufficiency and mild mitral regurgitation.  DVI was reported 0.24 with aortic valve area estimated 0.76 cm.  Left and right heart catheterization was performed January 12, 2020 by Dr. Quillian Quince.  Catheterization revealed severe aortic stenosis with mean transvalvular gradient measured 38 mmHg corresponding to aortic valve area calculated 0.94 cm.  There was mild pulmonary hypertension.  Coronary angiography revealed high-grade focal severe stenosis of the mid left anterior descending coronary artery and moderate stenosis of proximal segment of the large second obtuse marginal branch of the left circumflex coronary artery.  There was nonobstructive disease in the right coronary circulation.  Cardiothoracic surgical consultation was requested to consider conventional surgical intervention versus transcatheter aortic valve replacement with possible PCI.  During the course of the patient's preoperative work up they have been evaluated comprehensively by a multidisciplinary team of specialists coordinated through the [facility] in the [facility].  The patient underwent PCI and stenting of the left anterior descending coronary artery without complication and returned with plans for TAVR. The patient has been counseled extensively as to the relative risks and benefits of all options for the treatment of severe aortic stenosis including long term medical therapy, conventional surgery for aortic valve replacement, and transcatheter aortic valve replacement.  All questions  have been answered, and the patient provides full informed consent for the operation as described.   DETAILS OF  THE OPERATIVE PROCEDURE  PREPARATION:    The patient is brought to the operating room on the above mentioned date and central monitoring was established by the anesthesia team including placement of a central venous line and radial arterial line. The patient is placed in the supine position on the operating table.  Intravenous antibiotics are administered. The patient is monitored closely throughout the procedure under conscious sedation.  Baseline transthoracic echocardiogram was performed. The patient's chest, abdomen, both groins, and both lower extremities are prepared and draped in a sterile manner. A time out procedure is performed.   PERIPHERAL ACCESS:    Using the modified Seldinger technique, femoral arterial and venous access was obtained with placement of 6 Fr sheaths on the right side.  A pigtail diagnostic catheter was passed through the right arterial sheath under fluoroscopic guidance into the aortic root.  The pigtail catheter is positioned in the non-coronary sinus of Valsalva.  A temporary transvenous pacemaker catheter was passed through the right femoral venous sheath under fluoroscopic guidance into the right ventricle.  The pacemaker was tested to ensure stable lead placement and pacemaker capture. Aortic root angiography was performed in order to determine the optimal angiographic angle for valve deployment.   TRANSFEMORAL ACCESS:   Percutaneous transfemoral access and sheath placement was performed by Dr. Burt Knack using ultrasound guidance.  The left common femoral artery was cannulated using a micropuncture needle and appropriate location was verified using hand injection angiogram.  A pair of Abbott Perclose percutaneous closure devices were placed and a 6 French sheath replaced into the femoral artery.  The patient was heparinized systemically and ACT verified >  250 seconds.    A 14 Fr transfemoral Gore DrySeal sheath was introduced into the right common femoral artery after progressively dilating over an Amplatz superstiff wire. An AL-1 catheter was used to direct a straight-tip exchange length wire across the native aortic valve into the left ventricle. This was exchanged out for a pigtail catheter and position was confirmed in the LV apex. The pigtail catheter was exchanged for Confida wire in the LV apex.     TRANSCATHETER HEART VALVE DEPLOYMENT:   A Medtronic CoreValve Evolut Pro transcatheter heart valve (size29 mm, serial [id number]) was prepared and crimped per manufacturer's guidelines, and the proper loading of the valve is confirmed on the [facility] Pro delivery system using flouroscopy. The DrySeal sheath was removed and the valve and delivery system were advanced over the guidewire, through the iliac arteries and aorta, and advanced across the aortic arch using flouroscopy. The valve was carefully positioned across the aortic valve annulus. Once appropriate position of the valve has been confirmed by angiographic assessment, the valve is deployed gradually to 80%, at which time a second aortogram was performed to confirm the appropriate depth and position of deployment.  Valve function was evaluated using echocardiography and once final position was confirmed, deployment was completed, the valve released, and the delivery system carefully removed from the aortic root. Valve function is assessed using echocardiography. There is felt to be no paravalvular leak and no central aortic insufficiency.  The patient's hemodynamic recovery following valve deployment is rapid and uneventful.     PROCEDURE COMPLETION:   The deployment system is and guidewire were removed and femoral artery closure performed by securing the Perclose sutures.  Protamine was administered once femoral arterial repair was complete. The temporary pacemaker was removed.  The pigtail  catheters and femoral sheaths were removed with manual pressure used for hemostasis.  The patient tolerated the procedure well although he had a brief period of hypotension after administration of Protamine.  Repeat echocardiography was performed to make sure there was normal valve function and no pericardial effusion.  Hemodynamics recovered uneventfully and the patient was transported to the surgical intensive care unit in stable condition. There were no immediate intraoperative complications. All sponge instrument and needle counts are verified correct at completion of the operation.   No blood products were administered during the operation.     Rexene Alberts, MD 02/09/2020 6:39 PM

## 2020-02-09 NOTE — Anesthesia Procedure Notes (Signed)
Arterial Line Insertion Start/End5/08/2020 12:31 PM, 02/09/2020 12:31 PM Performed by: Kathryne Hitch, CRNA, CRNA  Patient location: Pre-op. Preanesthetic checklist: patient identified, IV checked, site marked, risks and benefits discussed, surgical consent, monitors and equipment checked, pre-op evaluation and timeout performed Lidocaine 1% used for infiltration Right, radial was placed Catheter size: 20 Fr Hand hygiene performed  and maximum sterile barriers used   Attempts: 2 Procedure performed without using ultrasound guided technique. Following insertion, dressing applied and Biopatch. Post procedure assessment: normal and unchanged

## 2020-02-09 NOTE — Progress Notes (Signed)
Post-TAVR Note: Pt hypotensive post-TAVR. Echo done in cath lab immediately after procedure demonstrated no effusion and normal aortic valve function. He was taken to CT for stat CT of the abdomen and pelvis and this shows no retroperitoneal hematoma.   With persistent hypotension, we elected to treat him for a contrast reaction with IV solumedrol 125 mg, benadryl 50 mg IV, and an epinephrine gtt. BP improved after these measures.   Discussed patient's condition with wife and updated her. Stat labs sent. Will continue to follow closely.  Sherren Mocha 02/09/2020 7:46 PM

## 2020-02-09 NOTE — Progress Notes (Signed)
Dr. Roxy Manns called, precedex drip ordered. LA is 7.2 was reported.

## 2020-02-09 NOTE — Transfer of Care (Signed)
Immediate Anesthesia Transfer of Care Note  Patient: Andre Jimenez  Procedure(s) Performed: TRANSCATHETER AORTIC VALVE REPLACEMENT, LEFT TRANSFEMORAL (Left Chest) TRANSESOPHAGEAL ECHOCARDIOGRAM (TEE) (N/A )  Patient Location: ICU  Anesthesia Type:MAC  Level of Consciousness: drowsy and pateint uncooperative  Airway & Oxygen Therapy: Patient Spontanous Breathing and Patient connected to nasal cannula oxygen  Post-op Assessment: Report given to RN and Post -op Vital signs reviewed and stable  Post vital signs: Reviewed and stable  Last Vitals:  Vitals Value Taken Time  BP 108/68 02/09/20 1945  Temp    Pulse 91 02/09/20 1951  Resp 29 02/09/20 1951  SpO2 97 % 02/09/20 1951  Vitals shown include unvalidated device data.  Last Pain:  Vitals:   02/09/20 1216  PainSc: 0-No pain      Patients Stated Pain Goal: 1 (21/22/48 2500)  Complications: No apparent anesthesia complications

## 2020-02-09 NOTE — Op Note (Signed)
HE[facility] AND VASCULAR CENTER   MULTIDISCIPLINARY HEART VALVE TEAM   TAVR OPERATIVE NOTE   Date of Procedure:  02/09/2020  Preoperative Diagnosis: Severe Aortic Stenosis   Postoperative Diagnosis: Same   Procedure:    Transcatheter Aortic Valve Replacement - Percutaneous Transfemoral Approach  Medtronic Evolut Pro Plus (size 29 mm,, serial # [id number])   Co-Surgeons:  Valentina Gu. Roxy Manns, MD and Sherren Mocha, MD  Anesthesiologist:  Nolon Nations, MD  Echocardiographer:  Sanda Klein, MD  Pre-operative Echo Findings:  Severe aortic stenosis  Normal left ventricular systolic function  Post-operative Echo Findings:  No paravalvular leak  Normal/unchanged left ventricular systolic function  BRIEF CLINICAL NOTE AND INDICATIONS FOR SURGERY  79 yo male with aortic stenosis, CAD, and Type II DM, presenting today for TAVR. He has undergone recent PCI for treatment of severe proximal LAD stenosis and has been shown to suffer from severe, stage D1, aortic stenosis with NYHA functional Class III symptoms of angina and chronic diastolic CHF. Because of a small aortic valve annulus, we elected to proceed with a supra-annular valve implant.   During the course of the patient's preoperative work up they have been evaluated comprehensively by a multidisciplinary team of specialists coordinated through the [facility] in the [facility].  They have been demonstrated to suffer from symptomatic severe aortic stenosis as noted above. The patient has been counseled extensively as to the relative risks and benefits of all options for the treatment of severe aortic stenosis including long term medical therapy, conventional surgery for aortic valve replacement, and transcatheter aortic valve replacement.  The patient has been independently evaluated in formal cardiac surgical consultation by Dr Roxy Manns, who deemed the patient appropriate for TAVR. Based  upon review of all of the patient's preoperative diagnostic tests they are felt to be candidate for transcatheter aortic valve replacement using the transfemoral approach as an alternative to conventional surgery.    Following the decision to proceed with transcatheter aortic valve replacement, a discussion has been held regarding what types of management strategies would be attempted intraoperatively in the event of life-threatening complications, including whether or not the patient would be considered a candidate for the use of cardiopulmonary bypass and/or conversion to open sternotomy for attempted surgical intervention.  The patient has been advised of a variety of complications that might develop peculiar to this approach including but not limited to risks of death, stroke, paravalvular leak, aortic dissection or other major vascular complications, aortic annulus rupture, device embolization, cardiac rupture or perforation, acute myocardial infarction, arrhythmia, heart block or bradycardia requiring permanent pacemaker placement, congestive heart failure, respiratory failure, renal failure, pneumonia, infection, other late complications related to structural valve deterioration or migration, or other complications that might ultimately cause a temporary or permanent loss of functional independence or other long term morbidity.  The patient provides full informed consent for the procedure as described and all questions were answered preoperatively.  DETAILS OF THE OPERATIVE PROCEDURE  PREPARATION:   The patient is brought to the operating room on the above mentioned date and central monitoring was established by the anesthesia team including placement of a central venous catheter and radial arterial line. The patient is placed in the supine position on the operating table.  Intravenous antibiotics are administered. The patient is monitored closely throughout the procedure under conscious  sedation.  Baseline transthoracic echocardiogram is performed. The patient's chest, abdomen, both groins, and both lower extremities are prepared and draped in a  sterile manner. A time out procedure is performed.   PERIPHERAL ACCESS:   Using ultrasound guidance, femoral arterial and venous access is obtained with placement of 6 Fr sheaths on the right side.  A pigtail diagnostic catheter was passed through the femoral arterial sheath under fluoroscopic guidance into the aortic root.  A temporary transvenous pacemaker catheter was passed through the femoral venous sheath under fluoroscopic guidance into the right ventricle.  The pacemaker was tested to ensure stable lead placement and pacemaker capture.   TRANSFEMORAL ACCESS:  A micropuncture technique is used to access the left femoral artery under fluoroscopic and ultrasound guidance.  2 Perclose devices are deployed at 10' and 2' positions to 'PreClose' the femoral artery. An 8 French sheath is placed and then an Amplatz Superstiff wire is advanced through the sheath. This is changed out for a 14 Fr Dry Seal sheath after progressively dilating over the Superstiff wire.  An AL-2 catheter was used to direct a straight-tip exchange length wire across the native aortic valve into the left ventricle. This was exchanged out for a pigtail catheter and position was confirmed in the LV apex. Simultaneous LV and Ao pressures were recorded.  The pigtail catheter was exchanged for a Confida wire in the LV apex.    BALLOON AORTIC VALVULOPLASTY:  Not performed  TRANSCATHETER HEART VALVE DEPLOYMENT:  A 29 mm Medtronic Evolut Pro Plus valve was prepared and crimped per manufacturer's guidelines, and the proper orientation of the valve is confirmed on the delivery system.  The 14 Fr Dry seal is removed and the Medtronic inline system is advanced into the aorta. The valve is then advanced around the aortic arch into the annulus. Using the cusp overlap technique, the  valve is deployed to 80% under rapid pacing at 120 bpm.  The patient's hemodynamic recovery following partial valve deployment is good.  Angiography confirms proper valve position. The valve is then fully deployed without complication. Echo demostrated acceptable post-procedural gradients, stable mitral valve function, and no aortic insufficiency. The valve delivery system and the trapped 0.035" wire are removed under fluoroscopy.    PROCEDURE COMPLETION:  The sheath was removed and femoral artery closure is performed using the 2 previously deployed Perclose devices.  Protamine is administered once femoral arterial repair was complete. The site is clear with no evidence of bleeding or hematoma after the sutures are tightened. The temporary pacemaker and pigtail catheters are removed. Mynx closure is used for contralateral femoral arterial hemostasis for the 6 Fr sheath.  The patient develops marked hypotension after protamine is administered. Echo is repeated and shows no effusion and again confirms normal valve function. LV function appears unchanged. Both groin sites are clear. With continued hypotension we elected to take him for a stat CT to evaluate for retroperitoneal hematoma. He will then be transported to the CV-ICU for further care.   The patient received a total of 28 mL of intravenous contrast during the procedure.   Sherren Mocha, MD 02/09/2020 6:42 PM

## 2020-02-09 NOTE — Progress Notes (Signed)
L fem site began rebleeding at 2155. Manual pressure held for 13min by this RN. Hemostasis achieved. Site level 1 (small palpable hematoma, small area of bruising at site). Redressed with fresh gauze and tegaderm. Pt tolerated well. Primary RN to continue monitoring.

## 2020-02-09 NOTE — Anesthesia Postprocedure Evaluation (Signed)
Anesthesia Post Note  Patient: Andre Jimenez  Procedure(s) Performed: TRANSCATHETER AORTIC VALVE REPLACEMENT, LEFT TRANSFEMORAL (Left Chest) TRANSESOPHAGEAL ECHOCARDIOGRAM (TEE) (N/A )     Patient location during evaluation: PACU Anesthesia Type: MAC Level of consciousness: awake and alert Pain management: pain level controlled Vital Signs Assessment: post-procedure vital signs reviewed and stable Respiratory status: spontaneous breathing, nonlabored ventilation, respiratory function stable and patient connected to nasal cannula oxygen Cardiovascular status: stable and blood pressure returned to baseline Postop Assessment: no apparent nausea or vomiting Anesthetic complications: no    Last Vitals:  Vitals:   02/09/20 1818 02/09/20 2000  BP:  106/70  Pulse: (!) 0 97  Resp:  (!) 26  Temp:  37.6 C  SpO2:  97%    Last Pain:  Vitals:   02/09/20 1216  PainSc: 0-No pain                 Elwyn Klosinski DAVID

## 2020-02-09 NOTE — Progress Notes (Signed)
  Echocardiogram 2D Echocardiogram limited has been performed.  Andre Jimenez 02/09/2020, 6:39 PM

## 2020-02-10 ENCOUNTER — Encounter (HOSPITAL_COMMUNITY): Payer: Self-pay | Admitting: Certified Registered"

## 2020-02-10 ENCOUNTER — Inpatient Hospital Stay (HOSPITAL_COMMUNITY): Payer: Medicare Other

## 2020-02-10 DIAGNOSIS — I350 Nonrheumatic aortic (valve) stenosis: Secondary | ICD-10-CM

## 2020-02-10 DIAGNOSIS — Z954 Presence of other heart-valve replacement: Secondary | ICD-10-CM

## 2020-02-10 DIAGNOSIS — Z952 Presence of prosthetic heart valve: Secondary | ICD-10-CM

## 2020-02-10 LAB — BASIC METABOLIC PANEL
Anion gap: 25 — ABNORMAL HIGH (ref 5–15)
Anion gap: 17 — ABNORMAL HIGH (ref 5–15)
Anion gap: 12 (ref 5–15)
Anion gap: 12 (ref 5–15)
Anion gap: 10 (ref 5–15)
BUN: 26 mg/dL — ABNORMAL HIGH (ref 8–23)
BUN: 25 mg/dL — ABNORMAL HIGH (ref 8–23)
BUN: 24 mg/dL — ABNORMAL HIGH (ref 8–23)
BUN: 25 mg/dL — ABNORMAL HIGH (ref 8–23)
BUN: 24 mg/dL — ABNORMAL HIGH (ref 8–23)
CO2: 10 mmol/L — ABNORMAL LOW (ref 22–32)
CO2: 18 mmol/L — ABNORMAL LOW (ref 22–32)
CO2: 21 mmol/L — ABNORMAL LOW (ref 22–32)
CO2: 21 mmol/L — ABNORMAL LOW (ref 22–32)
CO2: 25 mmol/L (ref 22–32)
Calcium: 8 mg/dL — ABNORMAL LOW (ref 8.9–10.3)
Calcium: 8 mg/dL — ABNORMAL LOW (ref 8.9–10.3)
Calcium: 7.9 mg/dL — ABNORMAL LOW (ref 8.9–10.3)
Calcium: 7.9 mg/dL — ABNORMAL LOW (ref 8.9–10.3)
Calcium: 7.9 mg/dL — ABNORMAL LOW (ref 8.9–10.3)
Chloride: 107 mmol/L (ref 98–111)
Chloride: 108 mmol/L (ref 98–111)
Chloride: 108 mmol/L (ref 98–111)
Chloride: 107 mmol/L (ref 98–111)
Chloride: 106 mmol/L (ref 98–111)
Creatinine, Ser: 2.34 mg/dL — ABNORMAL HIGH (ref 0.61–1.24)
Creatinine, Ser: 2.12 mg/dL — ABNORMAL HIGH (ref 0.61–1.24)
Creatinine, Ser: 1.69 mg/dL — ABNORMAL HIGH (ref 0.61–1.24)
Creatinine, Ser: 1.51 mg/dL — ABNORMAL HIGH (ref 0.61–1.24)
Creatinine, Ser: 1.49 mg/dL — ABNORMAL HIGH (ref 0.61–1.24)
GFR calc Af Amer: 30 mL/min — ABNORMAL LOW (ref >60)
GFR calc Af Amer: 34 mL/min — ABNORMAL LOW (ref >60)
GFR calc Af Amer: 44 mL/min — ABNORMAL LOW (ref >60)
GFR calc Af Amer: 51 mL/min — ABNORMAL LOW (ref >60)
GFR calc Af Amer: 52 mL/min — ABNORMAL LOW (ref >60)
GFR calc non Af Amer: 26 mL/min — ABNORMAL LOW (ref >60)
GFR calc non Af Amer: 29 mL/min — ABNORMAL LOW (ref >60)
GFR calc non Af Amer: 38 mL/min — ABNORMAL LOW (ref >60)
GFR calc non Af Amer: 44 mL/min — ABNORMAL LOW (ref >60)
GFR calc non Af Amer: 45 mL/min — ABNORMAL LOW (ref >60)
Glucose, Bld: 432 mg/dL — ABNORMAL HIGH (ref 70–99)
Glucose, Bld: 352 mg/dL — ABNORMAL HIGH (ref 70–99)
Glucose, Bld: 233 mg/dL — ABNORMAL HIGH (ref 70–99)
Glucose, Bld: 151 mg/dL — ABNORMAL HIGH (ref 70–99)
Glucose, Bld: 122 mg/dL — ABNORMAL HIGH (ref 70–99)
Potassium: 3.9 mmol/L (ref 3.5–5.1)
Potassium: 3.7 mmol/L (ref 3.5–5.1)
Potassium: 3.7 mmol/L (ref 3.5–5.1)
Potassium: 4.2 mmol/L (ref 3.5–5.1)
Potassium: 4.3 mmol/L (ref 3.5–5.1)
Sodium: 142 mmol/L (ref 135–145)
Sodium: 143 mmol/L (ref 135–145)
Sodium: 141 mmol/L (ref 135–145)
Sodium: 140 mmol/L (ref 135–145)
Sodium: 141 mmol/L (ref 135–145)

## 2020-02-10 LAB — GLUCOSE, CAPILLARY
Glucose-Capillary: 437 mg/dL — ABNORMAL HIGH (ref 70–99)
Glucose-Capillary: 349 mg/dL — ABNORMAL HIGH (ref 70–99)
Glucose-Capillary: 353 mg/dL — ABNORMAL HIGH (ref 70–99)
Glucose-Capillary: 322 mg/dL — ABNORMAL HIGH (ref 70–99)
Glucose-Capillary: 282 mg/dL — ABNORMAL HIGH (ref 70–99)
Glucose-Capillary: 241 mg/dL — ABNORMAL HIGH (ref 70–99)
Glucose-Capillary: 212 mg/dL — ABNORMAL HIGH (ref 70–99)
Glucose-Capillary: 183 mg/dL — ABNORMAL HIGH (ref 70–99)
Glucose-Capillary: 165 mg/dL — ABNORMAL HIGH (ref 70–99)
Glucose-Capillary: 116 mg/dL — ABNORMAL HIGH (ref 70–99)
Glucose-Capillary: 162 mg/dL — ABNORMAL HIGH (ref 70–99)
Glucose-Capillary: 137 mg/dL — ABNORMAL HIGH (ref 70–99)
Glucose-Capillary: 111 mg/dL — ABNORMAL HIGH (ref 70–99)
Glucose-Capillary: 124 mg/dL — ABNORMAL HIGH (ref 70–99)
Glucose-Capillary: 113 mg/dL — ABNORMAL HIGH (ref 70–99)
Glucose-Capillary: 94 mg/dL (ref 70–99)
Glucose-Capillary: 98 mg/dL (ref 70–99)
Glucose-Capillary: 108 mg/dL — ABNORMAL HIGH (ref 70–99)

## 2020-02-10 LAB — POCT I-STAT 7, (LYTES, BLD GAS, ICA,H+H)
Acid-base deficit: 18 mmol/L — ABNORMAL HIGH (ref 0.0–2.0)
Bicarbonate: 9.1 mmol/L — ABNORMAL LOW (ref 20.0–28.0)
Calcium, Ion: 1.11 mmol/L — ABNORMAL LOW (ref 1.15–1.40)
HCT: 43 % (ref 39.0–52.0)
Hemoglobin: 14.6 g/dL (ref 13.0–17.0)
O2 Saturation: 95 %
Patient temperature: 98.4
Potassium: 3.9 mmol/L (ref 3.5–5.1)
Sodium: 140 mmol/L (ref 135–145)
TCO2: 10 mmol/L — ABNORMAL LOW (ref 22–32)
pCO2 arterial: 25 mmHg — ABNORMAL LOW (ref 32.0–48.0)
pH, Arterial: 7.17 — CL (ref 7.350–7.450)
pO2, Arterial: 94 mmHg (ref 83.0–108.0)

## 2020-02-10 LAB — CBC
HCT: 45.3 % (ref 39.0–52.0)
Hemoglobin: 14.3 g/dL (ref 13.0–17.0)
MCH: 29.5 pg (ref 26.0–34.0)
MCHC: 31.6 g/dL (ref 30.0–36.0)
MCV: 93.6 fL (ref 80.0–100.0)
Platelets: 280 10*3/uL (ref 150–400)
RBC: 4.84 MIL/uL (ref 4.22–5.81)
RDW: 12.7 % (ref 11.5–15.5)
WBC: 36.6 10*3/uL — ABNORMAL HIGH (ref 4.0–10.5)
nRBC: 0 % (ref 0.0–0.2)

## 2020-02-10 LAB — BETA-HYDROXYBUTYRIC ACID
Beta-Hydroxybutyric Acid: 0.25 mmol/L (ref 0.05–0.27)
Beta-Hydroxybutyric Acid: 0.09 mmol/L (ref 0.05–0.27)

## 2020-02-10 LAB — LACTIC ACID, PLASMA
Lactic Acid, Venous: >11 mmol/L (ref 0.5–1.9)
Lactic Acid, Venous: >11 mmol/L (ref 0.5–1.9)

## 2020-02-10 LAB — ECHOCARDIOGRAM LIMITED
Height: 67 in
Weight: 3264 oz

## 2020-02-10 LAB — MAGNESIUM: Magnesium: 1.5 mg/dL — ABNORMAL LOW (ref 1.7–2.4)

## 2020-02-10 MED ORDER — INSULIN ASPART 100 UNIT/ML ~~LOC~~ SOLN
3.0000 [IU] | SUBCUTANEOUS | Status: DC
  Administered 2020-02-11: 9 [IU] via SUBCUTANEOUS
  Administered 2020-02-11 – 2020-02-12 (×2): 3 [IU] via SUBCUTANEOUS

## 2020-02-10 MED ORDER — SODIUM BICARBONATE 8.4 % IV SOLN
100.0000 meq | Freq: Once | INTRAVENOUS | Status: AC
  Administered 2020-02-10: 01:00:00 100 meq via INTRAVENOUS
  Filled 2020-02-10: qty 100

## 2020-02-10 MED ORDER — MAGNESIUM SULFATE 2 GM/50ML IV SOLN
2.0000 g | Freq: Once | INTRAVENOUS | Status: AC
  Administered 2020-02-10: 12:00:00 2 g via INTRAVENOUS
  Filled 2020-02-10: qty 50

## 2020-02-10 MED ORDER — PERFLUTREN LIPID MICROSPHERE
1.0000 mL | INTRAVENOUS | Status: AC | PRN
  Administered 2020-02-10: 3 mL via INTRAVENOUS
  Filled 2020-02-10: qty 10

## 2020-02-10 MED ORDER — INSULIN DETEMIR 100 UNIT/ML ~~LOC~~ SOLN
5.0000 [IU] | Freq: Two times a day (BID) | SUBCUTANEOUS | Status: DC
  Administered 2020-02-11 (×2): 5 [IU] via SUBCUTANEOUS
  Filled 2020-02-10 (×6): qty 0.05

## 2020-02-10 MED ORDER — CHLORHEXIDINE GLUCONATE CLOTH 2 % EX PADS
6.0000 | MEDICATED_PAD | Freq: Every day | CUTANEOUS | Status: DC
  Administered 2020-02-11: 6 via TOPICAL

## 2020-02-10 MED ORDER — PERFLUTREN LIPID MICROSPHERE
INTRAVENOUS | Status: AC
  Filled 2020-02-10: qty 10

## 2020-02-10 MED ORDER — SODIUM BICARBONATE 8.4 % IV SOLN
150.0000 meq | Freq: Once | INTRAVENOUS | Status: AC
  Administered 2020-02-10: 150 meq via INTRAVENOUS
  Filled 2020-02-10: qty 150

## 2020-02-10 MED ORDER — DEXTROSE 50 % IV SOLN: 0.0000 mL | INTRAVENOUS | Status: DC | PRN

## 2020-02-10 MED ORDER — SODIUM BICARBONATE-DEXTROSE 150-5 MEQ/L-% IV SOLN
150.0000 meq | INTRAVENOUS | Status: AC
  Administered 2020-02-10: 150 meq via INTRAVENOUS
  Filled 2020-02-10: qty 1000

## 2020-02-10 MED ORDER — DEXTROSE-NACL 5-0.45 % IV SOLN: INTRAVENOUS | Status: DC

## 2020-02-10 MED ORDER — MELATONIN 5 MG PO TABS
5.0000 mg | ORAL_TABLET | Freq: Every evening | ORAL | Status: DC | PRN
  Administered 2020-02-10: 23:00:00 5 mg via ORAL
  Filled 2020-02-10: qty 1

## 2020-02-10 MED ORDER — INSULIN REGULAR(HUMAN) IN NACL 100-0.9 UT/100ML-% IV SOLN
INTRAVENOUS | Status: DC
  Administered 2020-02-10: 14:00:00 6.5 [IU]/h via INTRAVENOUS
  Administered 2020-02-10: 08:00:00 14 [IU]/h via INTRAVENOUS
  Filled 2020-02-10 (×2): qty 100

## 2020-02-10 MED ORDER — SODIUM CHLORIDE 0.9 % IV SOLN: INTRAVENOUS | Status: DC

## 2020-02-10 NOTE — Progress Notes (Signed)
[address]       [place] [address]             [phone]        CARDIOTHORACIC SURGERY PROGRESS NOTE   R1 Day Post-Op Procedure(s) (LRB): TRANSCATHETER AORTIC VALVE REPLACEMENT, LEFT TRANSFEMORAL (Left) TRANSESOPHAGEAL ECHOCARDIOGRAM (TEE) (N/A)  Subjective: Sedated but wakes up.  Moves all 4 extremities w/ purpose.  No specific complaints.  No signs of pain with deep palpation of abdomen.  Objective: Vital signs: BP Readings from Last 1 Encounters:  02/10/20 (!) 116/57   Pulse Readings from Last 1 Encounters:  02/10/20 71   Resp Readings from Last 1 Encounters:  02/10/20 20   Temp Readings from Last 1 Encounters:  02/10/20 98.9 F (37.2 C) (Axillary)    Hemodynamics:    Physical Exam:  Rhythm:   sinus  Breath sounds: symmetrical  Heart sounds:  RRR  Incisions:  Both groins look okay  Abdomen:  Soft, non-distended, non-tender  Extremities:  Warm, well-perfused, palpable pulses   Intake/Output from previous day: 05/11 0701 - 05/12 0700 In: 3187.7 [I.V.:2044.4; IV Piggyback:1143.3] Out: 1150 [Urine:1150] Intake/Output this shift: No intake/output data recorded.  Lab Results:  CBC: Recent Labs    02/09/20 1941 02/09/20 2254 02/10/20 0507 02/10/20 0508  WBC 11.0*  --   --  36.6*  HGB 15.9   < > 14.6 14.3  HCT 49.5   < > 43.0 45.3  PLT 252  --   --  280   < > = values in this interval not displayed.    BMET:  Recent Labs    02/09/20 2252 02/09/20 2254 02/10/20 0507 02/10/20 0508  NA 141   < > 140 142  K 3.4*   < > 3.9 3.9  CL 106  --   --  107  CO2 12*  --   --  10*  GLUCOSE 252*  --   --  432*  BUN 22  --   --  26*  CREATININE 2.09*  --   --  2.34*  CALCIUM 7.8*  --   --  8.0*   < > = values in this interval not displayed.     PT/INR:  No results for input(s): LABPROT, INR in the last 72 hours.  CBG (last 3)  Recent Labs    02/09/20 2119 02/10/20 0626 02/10/20 0725  GLUCAP 93 437* 349*    ABG     Component Value Date/Time   PHART 7.170 (LL) 02/10/2020 0507   PCO2ART 25.0 (L) 02/10/2020 0507   PO2ART 94 02/10/2020 0507   HCO3 9.1 (L) 02/10/2020 0507   TCO2 10 (L) 02/10/2020 0507   ACIDBASEDEF 18.0 (H) 02/10/2020 0507   O2SAT 95.0 02/10/2020 0507    CXR: Clear   EKG: NSR w/out significant AV conduction delay   Assessment/Plan: S/P Procedure(s) (LRB): TRANSCATHETER AORTIC VALVE REPLACEMENT, LEFT TRANSFEMORAL (Left) TRANSESOPHAGEAL ECHOCARDIOGRAM (TEE) (N/A)  Patient is currently hemodynamically stable in NSR w/ stable BP off all pressors Severe hypotension and shock early after TAVR of unclear etiology but possibly an allergic reaction to IV contrast Severe lactic acidosis presumably secondary to profound shock - no other clinical findings to suggest acute mesenteric ischemia/infarction Acute non-oliguric renal failure due to AKI from shock Leukocytosis likely reactive +/- related to IV steroids Severe hyperglycemia - possibly due to Epinephrine   Consider CTA chest/abd/pelvis to r/o aortic dissection +/- signs of mesenteric ischemia/infarction - however, I favor holding  off on CTA for now given lack of any worrisome findings on clinical exam  Consider placing Foley catheter to monitor UOP  Start insulin drip w/ Endotool and follow CBGs  Continue sodium bicarb  Hold sedation and monitor clinical exam   Rexene Alberts, MD 02/10/2020 7:39 AM

## 2020-02-10 NOTE — Progress Notes (Signed)
  Echocardiogram 2D Echocardiogram has been performed.  Jennette Dubin 02/10/2020, 9:01 AM

## 2020-02-10 NOTE — Progress Notes (Signed)
CRITICAL VALUE ALERT  Critical Value:  Lactic: >11  Date & Time Notied:  5/12 0500   Provider Notified: Princella Pellegrini, MD  Orders Received/Actions taken: Administer 150 meq sodium bicarb, begin Bicarb gtt @ 100 meq/hr.

## 2020-02-10 NOTE — Progress Notes (Signed)
Pt had two smalls runs of VT.  Mag was 1.5 on AM labs and is currently being replaced. K was 3.7 on 0930 labs. Next BMET at 1230. PA was made aware. No new orders at this time. Will continue to monitor closely.

## 2020-02-10 NOTE — Progress Notes (Signed)
TAVR Team Note - PM Round: The patient is doing well.  He is awake and alert.  He does not remember the events of yesterday evening.  He has no complaints at present.  The patient's blood pressure has stabilized and is now within normal limits off of all pressors.  His heart rhythm is stable in normal sinus.  Afternoon labs are reviewed and demonstrate improvement in creatinine from a peak of 2.34 at 5 AM today down to 1.69.  He is maintaining good urine output.  He seems to be in the recovery phase from acute noncardiogenic shock, likely anaphylaxis from either iodinated contrast or protamine.  His abdominal exam remains benign.  We will discontinue his arterial line.  Plan to move him out to a telemetry bed tomorrow.  Sherren Mocha 02/10/2020 4:15 PM

## 2020-02-11 ENCOUNTER — Encounter: Payer: Self-pay | Admitting: Cardiology

## 2020-02-11 DIAGNOSIS — I5033 Acute on chronic diastolic (congestive) heart failure: Secondary | ICD-10-CM

## 2020-02-11 DIAGNOSIS — Z952 Presence of prosthetic heart valve: Secondary | ICD-10-CM

## 2020-02-11 LAB — CBC
HCT: 34.2 % — ABNORMAL LOW (ref 39.0–52.0)
Hemoglobin: 11 g/dL — ABNORMAL LOW (ref 13.0–17.0)
MCH: 28.7 pg (ref 26.0–34.0)
MCHC: 32.2 g/dL (ref 30.0–36.0)
MCV: 89.3 fL (ref 80.0–100.0)
Platelets: 161 10*3/uL (ref 150–400)
RBC: 3.83 MIL/uL — ABNORMAL LOW (ref 4.22–5.81)
RDW: 12.9 % (ref 11.5–15.5)
WBC: 20.6 10*3/uL — ABNORMAL HIGH (ref 4.0–10.5)
nRBC: 0 % (ref 0.0–0.2)

## 2020-02-11 LAB — BASIC METABOLIC PANEL
Anion gap: 10 (ref 5–15)
Anion gap: 10 (ref 5–15)
Anion gap: 9 (ref 5–15)
Anion gap: 12 (ref 5–15)
BUN: 27 mg/dL — ABNORMAL HIGH (ref 8–23)
BUN: 28 mg/dL — ABNORMAL HIGH (ref 8–23)
BUN: 28 mg/dL — ABNORMAL HIGH (ref 8–23)
BUN: 27 mg/dL — ABNORMAL HIGH (ref 8–23)
CO2: 23 mmol/L (ref 22–32)
CO2: 23 mmol/L (ref 22–32)
CO2: 26 mmol/L (ref 22–32)
CO2: 25 mmol/L (ref 22–32)
Calcium: 7.7 mg/dL — ABNORMAL LOW (ref 8.9–10.3)
Calcium: 7.7 mg/dL — ABNORMAL LOW (ref 8.9–10.3)
Calcium: 8.2 mg/dL — ABNORMAL LOW (ref 8.9–10.3)
Calcium: 8.2 mg/dL — ABNORMAL LOW (ref 8.9–10.3)
Chloride: 106 mmol/L (ref 98–111)
Chloride: 104 mmol/L (ref 98–111)
Chloride: 102 mmol/L (ref 98–111)
Chloride: 101 mmol/L (ref 98–111)
Creatinine, Ser: 1.66 mg/dL — ABNORMAL HIGH (ref 0.61–1.24)
Creatinine, Ser: 1.58 mg/dL — ABNORMAL HIGH (ref 0.61–1.24)
Creatinine, Ser: 1.58 mg/dL — ABNORMAL HIGH (ref 0.61–1.24)
Creatinine, Ser: 1.56 mg/dL — ABNORMAL HIGH (ref 0.61–1.24)
GFR calc Af Amer: 45 mL/min — ABNORMAL LOW (ref >60)
GFR calc Af Amer: 48 mL/min — ABNORMAL LOW (ref >60)
GFR calc Af Amer: 48 mL/min — ABNORMAL LOW (ref >60)
GFR calc Af Amer: 49 mL/min — ABNORMAL LOW (ref >60)
GFR calc non Af Amer: 39 mL/min — ABNORMAL LOW (ref >60)
GFR calc non Af Amer: 42 mL/min — ABNORMAL LOW (ref >60)
GFR calc non Af Amer: 42 mL/min — ABNORMAL LOW (ref >60)
GFR calc non Af Amer: 42 mL/min — ABNORMAL LOW (ref >60)
Glucose, Bld: 123 mg/dL — ABNORMAL HIGH (ref 70–99)
Glucose, Bld: 227 mg/dL — ABNORMAL HIGH (ref 70–99)
Glucose, Bld: 154 mg/dL — ABNORMAL HIGH (ref 70–99)
Glucose, Bld: 138 mg/dL — ABNORMAL HIGH (ref 70–99)
Potassium: 4.1 mmol/L (ref 3.5–5.1)
Potassium: 4.1 mmol/L (ref 3.5–5.1)
Potassium: 3.9 mmol/L (ref 3.5–5.1)
Potassium: 4 mmol/L (ref 3.5–5.1)
Sodium: 139 mmol/L (ref 135–145)
Sodium: 137 mmol/L (ref 135–145)
Sodium: 137 mmol/L (ref 135–145)
Sodium: 138 mmol/L (ref 135–145)

## 2020-02-11 LAB — GLUCOSE, CAPILLARY
Glucose-Capillary: 102 mg/dL — ABNORMAL HIGH (ref 70–99)
Glucose-Capillary: 105 mg/dL — ABNORMAL HIGH (ref 70–99)
Glucose-Capillary: 111 mg/dL — ABNORMAL HIGH (ref 70–99)
Glucose-Capillary: 118 mg/dL — ABNORMAL HIGH (ref 70–99)
Glucose-Capillary: 120 mg/dL — ABNORMAL HIGH (ref 70–99)
Glucose-Capillary: 145 mg/dL — ABNORMAL HIGH (ref 70–99)
Glucose-Capillary: 202 mg/dL — ABNORMAL HIGH (ref 70–99)

## 2020-02-11 LAB — BETA-HYDROXYBUTYRIC ACID
Beta-Hydroxybutyric Acid: 0.36 mmol/L — ABNORMAL HIGH (ref 0.05–0.27)
Beta-Hydroxybutyric Acid: 0.13 mmol/L (ref 0.05–0.27)
Beta-Hydroxybutyric Acid: 0.11 mmol/L (ref 0.05–0.27)

## 2020-02-11 LAB — MAGNESIUM: Magnesium: 1.7 mg/dL (ref 1.7–2.4)

## 2020-02-11 MED ORDER — FUROSEMIDE 10 MG/ML IJ SOLN
40.0000 mg | Freq: Once | INTRAMUSCULAR | Status: AC
  Administered 2020-02-11: 40 mg via INTRAVENOUS
  Filled 2020-02-11: qty 4

## 2020-02-11 MED ORDER — MAGNESIUM SULFATE 2 GM/50ML IV SOLN
2.0000 g | Freq: Once | INTRAVENOUS | Status: AC
  Administered 2020-02-11: 2 g via INTRAVENOUS
  Filled 2020-02-11: qty 50

## 2020-02-11 NOTE — Progress Notes (Addendum)
Progress Note  Patient Name: Andre Jimenez Date of Encounter: 02/11/2020  Primary Cardiologist: No primary care provider on file.   Subjective   Feeling well.  Denies chest pain or dyspnea.  Complains of weak legs.  Off of all IV infusions.  Inpatient Medications    Scheduled Meds: . aspirin EC  81 mg Oral Daily  . atorvastatin  40 mg Oral Daily  . Chlorhexidine Gluconate Cloth  6 each Topical Daily  . clopidogrel  75 mg Oral Daily  . insulin aspart  3-9 Units Subcutaneous Q4H  . insulin detemir  5 Units Subcutaneous Q12H  . pantoprazole (PROTONIX) IV  40 mg Intravenous Q24H  . sodium chloride flush  3 mL Intravenous Q12H   Continuous Infusions: . sodium chloride    . sodium chloride 75 mL/hr at 02/10/20 1135  . sodium chloride    . cefUROXime (ZINACEF)  IV Stopped (02/11/20 0557)  . dexmedetomidine (PRECEDEX) IV infusion Stopped (02/10/20 0852)  . dextrose 5 % and 0.45% NaCl Stopped (02/10/20 2216)  . epinephrine Stopped (02/10/20 [id number])  . nitroGLYCERIN Stopped (02/09/20 1925)  . phenylephrine (NEO-SYNEPHRINE) Adult infusion Stopped (02/10/20 0653)   PRN Meds: sodium chloride, acetaminophen **OR** acetaminophen, melatonin, morphine injection, ondansetron (ZOFRAN) IV, oxyCODONE, sodium chloride flush, traMADol   Vital Signs    Vitals:   02/11/20 0748 02/11/20 0800 02/11/20 0900 02/11/20 1000  BP:  (!) 124/56 (!) 144/73 133/63  Pulse:  84 78 68  Resp:  14 20 18   Temp: 97.7 F (36.5 C)     TempSrc:      SpO2:  96% 95% 96%  Weight:      Height:        Intake/Output Summary (Last 24 hours) at 02/11/2020 1108 Last data filed at 02/11/2020 1030 Gross per 24 hour  Intake 2118.7 ml  Output 1877 ml  Net 241.7 ml   Last 3 Weights 02/11/2020 02/09/2020 02/05/2020  Weight (lbs) 207 lb 6.4 oz 204 lb 204 lb 6.4 oz  Weight (kg) 94.076 kg 92.534 kg 92.715 kg      Telemetry    Sinus rhythm without significant arrhythmia  - Personally Reviewed   Physical Exam   Alert, oriented male in no distress GEN: No acute distress.   Neck: No JVD Cardiac: RRR, no murmurs, rubs, or gallops.  Respiratory: Clear to auscultation bilaterally. GI: Soft, nontender, non-distended  MS:  Trace bilateral pretibial edema; No deformity. Neuro:  Nonfocal  Psych: Normal affect   Labs    High Sensitivity Troponin:  No results for input(s): TROPONINIHS in the last 720 hours.    Chemistry Recent Labs  Lab 02/05/20 0939 02/09/20 1735 02/09/20 1941 02/09/20 2252 02/10/20 2243 02/11/20 0433 02/11/20 0942  NA 138   < > 142   < > 141 139 137  K 4.1   < > 2.9*   < > 4.3 4.1 4.1  CL 103   < > 113*   < > 106 106 104  CO2 20*  --  12*   < > 25 23 23   GLUCOSE 146*   < > 227*   < > 122* 123* 227*  BUN 19   < > 17   < > 24* 27* 28*  CREATININE 1.46*   < > 1.51*   < > 1.49* 1.66* 1.58*  CALCIUM 8.7*  --  6.7*   < > 7.9* 7.7* 7.7*  PROT 7.4  --  4.9*  --   --   --   --  ALBUMIN 4.0  --  2.5*  --   --   --   --   AST 16  --  19  --   --   --   --   ALT 16  --  12  --   --   --   --   ALKPHOS 83  --  76  --   --   --   --   BILITOT 0.9  --  1.4*  --   --   --   --   GFRNONAA 46*  --  44*   < > 45* 39* 42*  GFRAA 53*  --  51*   < > 52* 45* 48*  ANIONGAP 15  --  17*   < > 10 10 10    < > = values in this interval not displayed.     Hematology Recent Labs  Lab 02/09/20 1941 02/09/20 2254 02/10/20 0507 02/10/20 0508 02/11/20 0433  WBC 11.0*  --   --  36.6* 20.6*  RBC 5.41  --   --  4.84 3.83*  HGB 15.9   < > 14.6 14.3 11.0*  HCT 49.5   < > 43.0 45.3 34.2*  MCV 91.5  --   --  93.6 89.3  MCH 29.4  --   --  29.5 28.7  MCHC 32.1  --   --  31.6 32.2  RDW 12.5  --   --  12.7 12.9  PLT 252  --   --  280 161   < > = values in this interval not displayed.    BNP Recent Labs  Lab 02/05/20 0939  BNP 146.2*     DDimer No results for input(s): DDIMER in the last 168 hours.   Radiology    CT ABDOMEN PELVIS WO CONTRAST  Result Date: 02/09/2020 CLINICAL DATA:   Hypotension, shock status post TAVR, evaluate for retroperitoneal hematoma EXAM: CT ABDOMEN AND PELVIS WITHOUT CONTRAST TECHNIQUE: Multidetector CT imaging of the abdomen and pelvis was performed following the standard protocol without IV contrast. COMPARISON:  01/22/2020 FINDINGS: Lower chest: Aortic valve prosthesis partially visualized. No acute pleural or parenchymal lung disease. Hepatobiliary: High density material seen within the gallbladder consistent with small stones or sludge. No cholecystitis. The liver is unremarkable. Pancreas: Unremarkable. No pancreatic ductal dilatation or surrounding inflammatory changes. Spleen: Normal in size without focal abnormality. Adrenals/Urinary Tract: Excreted contrast within the kidneys from recent catheterization. No obstructive uropathy or urinary tract calculi. The adrenals are unremarkable. The bladder is moderately distended with no focal abnormality. Stomach/Bowel: No bowel obstruction or ileus. No bowel wall thickening or inflammatory changes. Vascular/Lymphatic: Aortic atherosclerosis. No enlarged abdominal or pelvic lymph nodes. Reproductive: Prostate is unremarkable. Other: There is no free fluid or free intraperitoneal gas. No evidence of retroperitoneal hemorrhage. Postsurgical changes are seen in the inguinal regions from recent catheterization. Musculoskeletal: No acute or destructive bony lesions. Postsurgical changes are seen from prior L4/L5 discectomy and fusion. IMPRESSION: 1. No evidence of retroperitoneal hemorrhage. 2. High density material within the gallbladder consistent with small stones or sludge. No cholecystitis. 3. Aortic Atherosclerosis (ICD10-I70.0). Electronically Signed   By: Randa Ngo M.D.   On: 02/09/2020 19:04   DG CHEST PORT 1 VIEW  Result Date: 02/10/2020 CLINICAL DATA:  Status post TAVR. EXAM: PORTABLE CHEST 1 VIEW COMPARISON:  Chest x-ray dated Feb 05, 2020. FINDINGS: The heart size and mediastinal contours are within  normal limits. Interval TAVR. Low lung volumes are present, causing  crowding of the pulmonary vasculature. No focal consolidation, pleural effusion, or pneumothorax. No acute osseous abnormality. IMPRESSION: 1. Interval TAVR.  No active disease. Electronically Signed   By: Titus Dubin M.D.   On: 02/10/2020 08:40   ECHOCARDIOGRAM LIMITED  Result Date: 02/10/2020    ECHOCARDIOGRAM LIMITED REPORT   Patient Name:   RICARD PENDERGRAPH Date of Exam: 02/10/2020 Medical Rec #:  [id number]        Height:       67.0 in Accession #:    [id number]       Weight:       204.0 lb Date of Birth:  [birth year]        BSA:          2.039 m Patient Age:    67 years         BP:           116/57 mmHg Patient Gender: M                HR:           71 bpm. Exam Location:  Inpatient Procedure: Limited Echo, Limited Color Doppler, Cardiac Doppler and Intracardiac            Opacification Agent Indications:    Post TAVR evaluation Z95.2  History:        Patient has prior history of Echocardiogram examinations, most                 recent 02/09/2020. CAD, Aortic Valve Disease; Risk                 Factors:Diabetes and Hypertension.                 Aortic Valve: 29 mm Medtronic CoreValve-Evolut Pro prosthetic,                 stented (TAVR) valve is present in the aortic position.                 Procedure Date: 02/09/2020.  Sonographer:    Mikki Santee RDCS (AE) Referring Phys: [id number] [place]  1. Left ventricular ejection fraction, by estimation, is 55 to 60%. The left ventricle has normal function. The left ventricle has no regional wall motion abnormalities. Left ventricular diastolic parameters are consistent with Grade I diastolic dysfunction (impaired relaxation). Elevated left atrial pressure.  2. Right ventricular systolic function is normal. Tricuspid regurgitation signal is inadequate for assessing PA pressure.  3. Left atrial size was mildly dilated.  4. Mild mitral valve regurgitation.  5. The aortic  valve has been repaired/replaced. Aortic valve regurgitation is not visualized. There is a 29 mm Medtronic CoreValve-Evolut Pro prosthetic (TAVR) valve present in the aortic position. Procedure Date: 02/09/2020. Echo findings are consistent  with normal structure and function of the aortic valve prosthesis. Aortic valve mean gradient measures 8.0 mmHg. Aortic valve Vmax measures 1.86 m/s. FINDINGS  Left Ventricle: Left ventricular ejection fraction, by estimation, is 55 to 60%. The left ventricle has normal function. The left ventricle has no regional wall motion abnormalities. Definity contrast agent was given IV to delineate the left ventricular  endocardial borders. The left ventricular internal cavity size was normal in size. There is no left ventricular hypertrophy. Elevated left atrial pressure. Right Ventricle: No increase in right ventricular wall thickness. Right ventricular systolic function is normal. Tricuspid regurgitation signal is inadequate for assessing PA pressure. Left Atrium: Left atrial size was mildly dilated.  Right Atrium: Right atrial size was normal in size. Mitral Valve: There is mild thickening of the mitral valve leaflet(s). Mild mitral annular calcification. Mild mitral valve regurgitation. Tricuspid Valve: The tricuspid valve is normal in structure. Tricuspid valve regurgitation is not demonstrated. Aortic Valve: The aortic valve has been repaired/replaced. Aortic valve regurgitation is not visualized. Aortic valve mean gradient measures 8.0 mmHg. Aortic valve peak gradient measures 13.8 mmHg. Aortic valve area, by VTI measures 1.71 cm. There is a 29 mm Medtronic CoreValve-Evolut Pro prosthetic, stented (TAVR) valve present in the aortic position. Procedure Date: 02/09/2020. Echo findings are consistent with normal structure and function of the aortic valve prosthesis. Pulmonic Valve: The pulmonic valve was grossly normal. Pulmonic valve regurgitation is not visualized. Aorta: The aortic  root and ascending aorta are structurally normal, with no evidence of dilitation. IAS/Shunts: No atrial level shunt detected by color flow Doppler.  LEFT VENTRICLE PLAX 2D LVIDd:         5.30 cm  Diastology LVIDs:         3.74 cm  LV e' lateral:   4.53 cm/s LV PW:         1.00 cm  LV E/e' lateral: 15.4 LV IVS:        1.00 cm  LV e' medial:    3.57 cm/s LVOT diam:     2.00 cm  LV E/e' medial:  19.5 LV SV:         78 LV SV Index:   38 LVOT Area:     3.14 cm  LEFT ATRIUM         Index LA diam:    3.70 cm 1.81 cm/m  AORTIC VALVE AV Area (Vmax):    1.59 cm AV Area (Vmean):   1.55 cm AV Area (VTI):     1.71 cm AV Vmax:           186.00 cm/s AV Vmean:          131.000 cm/s AV VTI:            0.456 m AV Peak Grad:      13.8 mmHg AV Mean Grad:      8.0 mmHg LVOT Vmax:         94.20 cm/s LVOT Vmean:        64.500 cm/s LVOT VTI:          0.248 m LVOT/AV VTI ratio: 0.54  AORTA Ao Root diam: 2.70 cm MITRAL VALVE MV Area (PHT): 1.94 cm     SHUNTS MV Decel Time: 391 msec     Systemic VTI:  0.25 m MV E velocity: 69.70 cm/s   Systemic Diam: 2.00 cm MV A velocity: 110.00 cm/s MV E/A ratio:  0.63 Mihai Croitoru MD Electronically signed by Sanda Klein MD Signature Date/Time: 02/10/2020/6:05:35 PM    Final    ECHOCARDIOGRAM LIMITED  Result Date: 02/10/2020    ECHOCARDIOGRAM LIMITED REPORT   Patient Name:   DEVONTAYE EDGHILL Date of Exam: 02/09/2020 Medical Rec #:  [id number]        Height:       67.0 in Accession #:    [id number]       Weight:       204.0 lb Date of Birth:  05-May-1941        BSA:          2.039 m Patient Age:    79 years         BP:  50/32 mmHg Patient Gender: M                HR:           64 bpm. Exam Location:  Inpatient Procedure: 2D Echo, Cardiac Doppler and Color Doppler Indications:    TAVR valve  History:        Patient has no prior history of Echocardiogram examinations.                 CAD, Aortic Valve Disease; Risk Factors:Hypertension, Diabetes                 and Dyslipidemia.   Sonographer:    Clayton Lefort RDCS (AE) Referring Phys: [phone] [facility]                 [phone]                  PREPROCEDURAL EXAM:                 Normal left ventricular systolic function. Estimated LVEF 55%.                 There are no regional wall motion abnormalities.                 Severe calcific aortic stenosis. Peak aortic gradient 51 mm Hg,                 mean gradient 28 mm Hg, dimensionless obstructive index 0.23,                 calculated aortic valve area 0.87 cm sq.                 There is mild-moderate aortic insufficiency.                 There is mild mitral insufficiency.                 There is no pericardial effusion.                  PREPROCEDURAL EXAM:                 Normal left ventricular systolic function. Estimated LVEF                 50-55%. There are no regional wall motion abnormalities.                 Severe calcific aortic stenosis. Peak aortic gradient 19 mm Hg,                 mean gradient 10 mm Hg, dimensionless obstructive index 0.53,                 calculated aortic valve area 2.00 cm sq.                 There is no perivalvular or intravalvular aortic insufficiency.                 There is no mitral insufficiency.                 There is no pericardial effusion. IMPRESSIONS  1. Left ventricular ejection fraction, by estimation, is 50 to 55%. The left ventricle has low normal function. The left ventricle demonstrates regional wall motion abnormalities (see scoring diagram/findings for description).  2. Right ventricular systolic function is normal. The right ventricular size is normal. Tricuspid regurgitation signal  is inadequate for assessing PA pressure.  3. Left atrial size was mildly dilated.  4. The mitral valve is normal in structure. Mild mitral valve regurgitation.  5. The aortic valve is tricuspid. Aortic valve regurgitation is trivial. Severe aortic valve stenosis. FINDINGS  Left Ventricle: Left ventricular ejection fraction, by estimation, is  50 to 55%. The left ventricle has low normal function. The left ventricle demonstrates regional wall motion abnormalities. The left ventricular internal cavity size was normal in size. Right Ventricle: The right ventricular size is normal. No increase in right ventricular wall thickness. Right ventricular systolic function is normal. Tricuspid regurgitation signal is inadequate for assessing PA pressure. Left Atrium: Left atrial size was mildly dilated. Right Atrium: Right atrial size was normal in size. Pericardium: There is no evidence of pericardial effusion. Mitral Valve: The mitral valve is normal in structure. There is mild thickening of the mitral valve leaflet(s). Mild mitral annular calcification. Mild mitral valve regurgitation. Tricuspid Valve: The tricuspid valve is normal in structure. Tricuspid valve regurgitation is not demonstrated. Aortic Valve: The aortic valve is tricuspid. . There is severe thickening and severe calcifcation of the aortic valve. Aortic valve regurgitation is trivial. Aortic regurgitation PHT measures 301 msec. Severe aortic stenosis is present. There is severe thickening of the aortic valve. There is severe calcifcation of the aortic valve. Aortic valve mean gradient measures 10.0 mmHg. Aortic valve peak gradient measures 18.5 mmHg. Aortic valve area, by VTI measures 2.00 cm. Pulmonic Valve: The pulmonic valve was not assessed. Aorta: The aortic root and ascending aorta are structurally normal, with no evidence of dilitation. IAS/Shunts: The interatrial septum was not assessed.  LEFT VENTRICLE PLAX 2D LVOT diam:     2.20 cm LV SV:         97 LV SV Index:   47 LVOT Area:     3.80 cm  AORTIC VALVE AV Area (Vmax):    1.98 cm AV Area (Vmean):   2.03 cm AV Area (VTI):     2.00 cm AV Vmax:           215.00 cm/s AV Vmean:          146.000 cm/s AV VTI:            0.483 m AV Peak Grad:      18.5 mmHg AV Mean Grad:      10.0 mmHg LVOT Vmax:         112.00 cm/s LVOT Vmean:         78.000 cm/s LVOT VTI:          0.254 m LVOT/AV VTI ratio: 0.53 AI PHT:            301 msec  SHUNTS Systemic VTI:  0.25 m Systemic Diam: 2.20 cm Sanda Klein MD Electronically signed by Sanda Klein MD Signature Date/Time: 02/10/2020/6:00:31 PM    Final    Structural Heart Procedure  Result Date: 02/09/2020 See surgical note for result.   Cardiac Studies   Echo (POD #1 study) IMPRESSIONS    1. Left ventricular ejection fraction, by estimation, is 55 to 60%. The  left ventricle has normal function. The left ventricle has no regional  wall motion abnormalities. Left ventricular diastolic parameters are  consistent with Grade I diastolic  dysfunction (impaired relaxation). Elevated left atrial pressure.  2. Right ventricular systolic function is normal. Tricuspid regurgitation  signal is inadequate for assessing PA pressure.  3. Left atrial size was mildly dilated.  4. Mild mitral valve  regurgitation.  5. The aortic valve has been repaired/replaced. Aortic valve  regurgitation is not visualized. There is a 29 mm Medtronic  CoreValve-Evolut Pro prosthetic (TAVR) valve present in the aortic  position. Procedure Date: 02/09/2020. Echo findings are consistent  with normal structure and function of the aortic valve prosthesis. Aortic  valve mean gradient measures 8.0 mmHg. Aortic valve Vmax measures 1.86  m/s.   Patient Profile     79 y.o. male with coronary artery disease and severe symptomatic aortic stenosis who presented for TAVR [id number] complicated by postoperative anaphylactic shock  Assessment & Plan    1.  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  Patient was treated with a 29 mm evolute pro plus self-expanding supra annular valve.  Postoperative echo is reviewed and demonstrates normal function of his transcatheter heart valve with a mean gradient of 8 mmHg and no significant paravalvular regurgitation.  He should be continued on  aspirin and clopidogrel for at least 6 months. 2.  Noncardiogenic shock, likely anaphylaxis: Patient was critically ill after his procedure with marked hypotension and severe lactic acidosis.  He required inotropic support overnight and slowly weaned off of epinephrine and Neo-Synephrine drips.  Course was complicated by acute kidney injury which is now improving.  He is now hemodynamically stable and rapidly improving.  He is off of all vasopressor therapy and ready for transfer to a telemetry unit.  Will add iodinated contrast and protamine to his allergy list as there is no way to know which agent he had this severe reaction to. 3.  Acute kidney injury: Improving with stable creatinine today at 1.58 mg/dL. 4.  Acute on chronic diastolic heart failure: Patient required aggressive fluid resuscitation and now has some evidence of volume overload.  Weight is up about 3-1/2 pounds from baseline.  We will give a single dose of IV furosemide today and reassess tomorrow.  Repeat metabolic panel tomorrow. 5.  Type 2 diabetes with severe hyperglycemia: Now resolved with discontinuation of insulin drip.  He had received high-dose IV steroids, epinephrine, and with a severe stress reaction had markedly elevated glucoses.  All of this is now improved and his blood glucoses over the last 12 hours have ranged from 98-120.  Disposition: IV furosemide this morning, transfer to 4 E., anticipate discharge home tomorrow.  For questions or updates, please contact [place] Please consult [email or web address] for contact info under        Signed, Sherren Mocha, MD  02/11/2020, 11:08 AM

## 2020-02-11 NOTE — Progress Notes (Signed)
CARDIAC REHAB PHASE I   PRE:  Rate/Rhythm: 72 SR  BP:  Supine: 134/76  Sitting:   Standing:    SaO2: 95%RA  MODE:  Ambulation: 290 ft   POST:  Rate/Rhythm: 93 SR  BP:  Supine: 155/74  Sitting:   Standing:    SaO2: 95%RA 1253-1315 Pt walked 290 ft on RA with EVA, gait belt use and asst x 2. Had rollator available in case pt needed to sit to rest. Pt stated his legs were weak but seemed to get stronger with walk. He c/o hard to pick right foot up, dragging toes a little. Offered recliner but pt requested bed. Notified RN of pt not able to pick right foot up well.  In bed with call bell. Denied SOB.   Graylon Good, RN BSN  02/11/2020 1:10 PM

## 2020-02-11 NOTE — Discharge Summary (Addendum)
[place] VALVE TEAM  Discharge Summary    Patient ID: Andre Jimenez MRN: [id number]; DOB: June 08, 1941  Admit date: 02/09/2020 Discharge date: 02/12/2020  Primary Care Provider: Myrlene Broker, MD  Primary Cardiologist: Dr. Otho Perl / Dr. Burt Knack & Dr. Roxy Manns (TAVR)  Discharge Diagnoses    Principal Problem:   S/P TAVR (transcatheter aortic valve replacement) Active Problems:   Coronary artery disease involving native coronary artery of native heart with angina pectoris [facility])   Essential hypertension   Type II diabetes mellitus ([place])   Mixed hyperlipidemia   Gout   Carpal tunnel syndrome - left   GERD (gastroesophageal reflux disease)   Bilateral hearing loss   Severe aortic stenosis   Carotid artery disease (HCC)   Anaphylaxis   Acute on chronic diastolic heart failure (HCC)   Allergies Allergies  Allergen Reactions  . Ivp Dye [Iodinated Diagnostic Agents] Anaphylaxis    Had anaphylactic reaction after TAVR- potentially related to contrast dye  . Protamine Anaphylaxis    Had potential anaphylactic shock after protamine administration after TAVR    Diagnostic Studies/Procedures    TAVR OPERATIVE NOTE   Date of Procedure:                02/09/2020  Preoperative Diagnosis:      Severe Aortic Stenosis   Postoperative Diagnosis:    Same   Procedure:        Transcatheter Aortic Valve Replacement - Percutaneous Left Transfemoral Approach             Medtronic CoreValve Evolut Pro (size 29 mm, serial # [id number])              Co-Surgeons:                        Valentina Gu. Roxy Manns, MD and Sherren Mocha, MD  Anesthesiologist:                  Nolon Nations, MD  Echocardiographer:              Sanda Klein, MD  Pre-operative Echo Findings: ? Severe aortic stenosis ? Normal left ventricular systolic function  Post-operative Echo Findings: ? No paravalvular leak ? Normal left ventricular systolic  function  _____________   Echo 02/10/20:  IMPRESSIONS  1. Left ventricular ejection fraction, by estimation, is 55 to 60%. The  left ventricle has normal function. The left ventricle has no regional  wall motion abnormalities. Left ventricular diastolic parameters are  consistent with Grade I diastolic  dysfunction (impaired relaxation). Elevated left atrial pressure.  2. Right ventricular systolic function is normal. Tricuspid regurgitation  signal is inadequate for assessing PA pressure.  3. Left atrial size was mildly dilated.  4. Mild mitral valve regurgitation.  5. The aortic valve has been repaired/replaced. Aortic valve  regurgitation is not visualized. There is a 29 mm Medtronic  CoreValve-Evolut Pro prosthetic (TAVR) valve present in the aortic  position. Procedure Date: 02/09/2020. Echo findings are consistent  with normal structure and function of the aortic valve prosthesis. Aortic  valve mean gradient measures 8.0 mmHg. Aortic valve Vmax measures 1.86 m/s.    History of Present Illness     Andre Jimenez is a 79 y.o. male with a history of CAD, HTN, HLD, DMT2, carotid artery stenosis (50-69% RICA stenosis followed by PCP) and severe AS who presented to [facility] on 02/10/20 for planned TAVR.  The patient reports  a longstanding heart murmur since he was a young man.  He has been physically active throughout most of his life until this past winter when he describes the onset of exertional angina and shortness of breath.The patient underwent formal cardiac evaluation and an echocardiogram demonstrated findings consistent with severe aortic stenosis with a peak transaortic velocity of 3.8 m/s, mean transvalvular gradient of 33 mmHg, and dimensionless index of 0.24.  He then underwent cardiac catheterization confirming the presence of severe aortic stenosis with a mean transvalvular gradient of 38 mmHg and calculated aortic valve area of 0.94 cm.  He was found to have severe  diffuse proximal LAD stenosis and moderately severe stenosis of the large second obtuse marginal branch of the left circumflex.  The patient was seen in surgical consultation by Dr. Roxy Manns.  He underwent CTA studies of the heart as well as the chest, abdomen, and pelvis.  After review of his studies and further discussion with the patient, recommendations were made for PCI followed by TAVR for treatment of his coronary artery disease and aortic stenosis, respectively.He underwent sucessful PCI of the proximal LAD with implantation of a 3.0 x 38 mm resolute Onyx DES. There was negative RFR analysis of moderate stenosis in a large second obtuse marginal branch of the circumflex.  The patient has been evaluated by the multidisciplinary valve team and felt to have severe, symptomatic aortic stenosis and to be a suitable candidate for TAVR, which was set up for 02/09/20.   Hospital Course     Consultants: none   Severe AS: s/p successful TAVR with a 29 mm Medtronic Evolut Pro + 3 THV via the TF approach on 02/09/20. Post operative echo showed EF 55-60% normally functioning TAVR with a mean gradient of 8 mm Hg and no PVL. Groin sites are stable. ECG with 1st degree AV block but no high grade heart block. Continue Asprin and plavix. Plan for discharge home today with close follow up in the office next week.   Noncardiogenic shock: likely anaphylaxis. Patient was critically ill after his procedure with marked hypotension and severe lactic acidosis. Treated with IV solumedrol 125 mg, benadryl 50 mg IV, and an epinephrine gtt. He required inotropic support overnight postoperatively and slowly weaned off of epinephrine and Neo-Synephrine drips. Now totally recovered. Both protamine and contrast dye have been added to his allergy list.   AKI: hospital course also complicated by acute kidney injury which is now improving. Likely 2/2 hypoperfusion in the setting of shock. Peak creat up to 2.34. Now improved to 1.56.    CAD: pre TAVR cath showed revealed high-grade focal severe stenosis of the mid left anterior descending coronary artery and moderate stenosis of proximal segment of the large second obtuse marginal branch of the left circumflex coronary artery. There was nonobstructive disease in the right coronary circulation. He is s/p successful PCI/DES to pLAD and negative FFR of moderate stenosis of large OM2 of LCx on 4/29. DAPT for at least 6 months  Acute on chronic diastolic CHF: as evidenced by an elevated BNP on pre admission lab work. This has been treated with TAVR.  He had evidence of mild volume overload after aggressive fluid resuscitation. He was treated with IV lasix. Will resume home Mycardis at discharge  HTN: BP elevated this morning. Resume home medications.  DMT2: treated with SSI. Resume home meds at discharge.   Foot drop: Cardiac rehab noted foot drop and recommended a rolling walker, which has been ordered. _____________  Discharge Vitals  Blood pressure (!) 158/65, pulse 63, temperature 97.6 F (36.4 C), temperature source Oral, resp. rate 17, height 5\' 7"  (1.702 m), weight 92.2 kg, SpO2 97 %.  Filed Weights   02/11/20 0500 02/11/20 2009 02/12/20 0403  Weight: 94.1 kg 92.9 kg 92.2 kg    GEN: Well nourished, well developed, in no acute distress HEENT: normal Neck: no JVD or masses Cardiac: RRR; no murmurs, rubs, or gallops,no edema  Respiratory:  clear to auscultation bilaterally, normal work of breathing GI: soft, nontender, nondistended, + BS MS: no deformity or atrophy Skin: warm and dry, no rash.  Groin sites clear without hematoma. There is some mild ecchymosis Neuro:  Alert and Oriented x 3, Strength and sensation are intact Psych: euthymic mood, full affect   Labs & Radiologic Studies    CBC Recent Labs    02/11/20 0433 02/12/20 0446  WBC 20.6* 14.6*  HGB 11.0* 10.9*  HCT 34.2* 33.2*  MCV 89.3 89.0  PLT 161 [id number]   Basic Metabolic Panel Recent Labs     02/10/20 0508 02/10/20 0933 02/11/20 0433 02/11/20 0942 02/11/20 1413 02/11/20 1659  NA 142   < > 139   < > 137 138  K 3.9   < > 4.1   < > 3.9 4.0  CL 107   < > 106   < > 102 101  CO2 10*   < > 23   < > 26 25  GLUCOSE 432*   < > 123*   < > 154* 138*  BUN 26*   < > 27*   < > 28* 27*  CREATININE 2.34*   < > 1.66*   < > 1.58* 1.56*  CALCIUM 8.0*   < > 7.7*   < > 8.2* 8.2*  MG 1.5*  --  1.7  --   --   --    < > = values in this interval not displayed.   Liver Function Tests Recent Labs    02/09/20 1941  AST 19  ALT 12  ALKPHOS 76  BILITOT 1.4*  PROT 4.9*  ALBUMIN 2.5*   No results for input(s): LIPASE, AMYLASE in the last 72 hours. Cardiac Enzymes No results for input(s): CKTOTAL, CKMB, CKMBINDEX, TROPONINI in the last 72 hours. BNP Invalid input(s): POCBNP D-Dimer No results for input(s): DDIMER in the last 72 hours. Hemoglobin A1C No results for input(s): HGBA1C in the last 72 hours. Fasting Lipid Panel No results for input(s): CHOL, HDL, LDLCALC, TRIG, CHOLHDL, LDLDIRECT in the last 72 hours. Thyroid Function Tests No results for input(s): TSH, T4TOTAL, T3FREE, THYROIDAB in the last 72 hours.  Invalid input(s): FREET3 _____________  CT ABDOMEN PELVIS WO CONTRAST  Result Date: 02/09/2020 CLINICAL DATA:  Hypotension, shock status post TAVR, evaluate for retroperitoneal hematoma EXAM: CT ABDOMEN AND PELVIS WITHOUT CONTRAST TECHNIQUE: Multidetector CT imaging of the abdomen and pelvis was performed following the standard protocol without IV contrast. COMPARISON:  01/22/2020 FINDINGS: Lower chest: Aortic valve prosthesis partially visualized. No acute pleural or parenchymal lung disease. Hepatobiliary: High density material seen within the gallbladder consistent with small stones or sludge. No cholecystitis. The liver is unremarkable. Pancreas: Unremarkable. No pancreatic ductal dilatation or surrounding inflammatory changes. Spleen: Normal in size without focal abnormality.  Adrenals/Urinary Tract: Excreted contrast within the kidneys from recent catheterization. No obstructive uropathy or urinary tract calculi. The adrenals are unremarkable. The bladder is moderately distended with no focal abnormality. Stomach/Bowel: No bowel obstruction or ileus. No bowel wall thickening or inflammatory  changes. Vascular/Lymphatic: Aortic atherosclerosis. No enlarged abdominal or pelvic lymph nodes. Reproductive: Prostate is unremarkable. Other: There is no free fluid or free intraperitoneal gas. No evidence of retroperitoneal hemorrhage. Postsurgical changes are seen in the inguinal regions from recent catheterization. Musculoskeletal: No acute or destructive bony lesions. Postsurgical changes are seen from prior L4/L5 discectomy and fusion. IMPRESSION: 1. No evidence of retroperitoneal hemorrhage. 2. High density material within the gallbladder consistent with small stones or sludge. No cholecystitis. 3. Aortic Atherosclerosis (ICD10-I70.0). Electronically Signed   By: Randa Ngo M.D.   On: 02/09/2020 19:04   DG Chest 2 View  Result Date: 02/05/2020 CLINICAL DATA:  Severe aortic stenosis. Additional history provided: Outpatient encounter for preoperative examination, patient is to have TAVR on Tuesday. EXAM: CHEST - 2 VIEW COMPARISON:  CT a chest 01/22/2020 FINDINGS: Heart size within normal limits. Aortic atherosclerosis. No appreciable airspace consolidation within the lungs. No evidence of pleural effusion or pneumothorax. No acute bony abnormality. ACDF hardware within the visualized lower cervical spine. Thoracic spondylosis. IMPRESSION: No evidence of acute cardiopulmonary abnormality. Aortic Atherosclerosis (ICD10-I70.0). Electronically Signed   By: Kellie Simmering DO   On: 02/05/2020 14:29   CARDIAC CATHETERIZATION  Result Date: 01/28/2020 1.  Successful PCI of the proximal LAD with implantation of a 3.0 x 38 mm resolute Onyx DES 2.  Negative RFR analysis of moderate stenosis in a  large second obtuse marginal branch of the circumflex Recommend: Dual antiplatelet therapy with aspirin and clopidogrel at least 6 months without interruption, same-day PCI protocol as long as no early complications arise.  TAVR is planned in the near future.  CT CORONARY MORPH W/CTA COR W/SCORE W/CA W/CM &/OR WO/CM  Addendum Date: 01/24/2020   ADDENDUM REPORT: 01/24/2020 21:41 CLINICAL DATA:  Aortic stenosis EXAM: Cardiac TAVR CT TECHNIQUE: The patient was scanned on a Siemens Force [id number] slice scanner. A 120 kV retrospective scan was triggered in the descending thoracic aorta at 111 HU's. Gantry rotation speed was 270 msecs and collimation was .9 mm. No beta blockade or nitro were given. The 3D data set was reconstructed in 5% intervals of the R-R cycle. Systolic and diastolic phases were analyzed on a dedicated work station using MPR, MIP and VRT modes. The patient received 175mL OMNIPAQUE IOHEXOL 350 MG/ML SOLN of contrast. FINDINGS: Suboptimal timing of contrast opacification. Aortic Valve: Trileaflet aortic valve. Severely reduced cusp separation. Moderately thickened, severely calcified aortic valve cusps. AV calcium score: 2017 Virtual Basal Annulus Measurements: Maximum/Minimum Diameter: 25.4 x 22.1 mm Perimeter: 73.9 mm Area: 418 mm2 No significant LVOT calcifications. Based on these measurements, the annulus would be suitable for a 23 mm Sapien 3 valve. Sinus of Valsalva Measurements: Non-coronary:  30 mm Right - coronary:  30 mm Left - coronary:  31 mm Sinus of Valsalva Height: Left: 14.5 mm Right: 18.1 mm Aorta: Severe calcifications. Conventional 3 vessel branch pattern of aortic arch. Sinotubular Junction:  27 mm Ascending Thoracic Aorta:  35 mm Aortic Arch:  28 mm Descending Thoracic Aorta:  24 mm Coronary Artery Height above Annulus: Left Main: 11.3 mm Right Coronary: 14.3 mm Coronary Arteries: 3 vessel coronary artery calcifications Optimum Fluoroscopic Angle for Delivery: RAO 1, CRA 1 No left  atrial appendage thrombus. IMPRESSION: 1. Aortic Valve: Trileaflet aortic valve. Severely reduced cusp separation. Moderately thickened, severely calcified aortic valve cusps. 2.  AV calcium score: 2017 3.  Annulus Area: 418 mm2, suitable for 23 mm Sapien 3 valve 4.  Adequate coronary artery height from annulus 5.  Optimum Fluoroscopic Angle for Delivery: RAO 1, CRA 1 Electronically Signed   By: Cherlynn Kaiser   On: 01/24/2020 21:41   Result Date: 01/24/2020 EXAM: OVER-READ INTERPRETATION  CT CHEST The following report is an over-read performed by radiologist Dr. Vinnie Langton of [facility], [place] on 01/22/2020. This over-read does not include interpretation of cardiac or coronary anatomy or pathology. The coronary calcium score/coronary CTA interpretation by the cardiologist is attached. COMPARISON:  None. FINDINGS: Extracardiac findings will be described separately under dictation for contemporaneously obtained CTA chest, abdomen and pelvis dated 01/22/2020. IMPRESSION: Please see separate dictation for contemporaneously obtained CTA chest, abdomen and pelvis dated 01/22/2020 for full description of relevant extracardiac findings. Electronically Signed: By: Vinnie Langton M.D. On: 01/22/2020 13:33   DG CHEST PORT 1 VIEW  Result Date: 02/10/2020 CLINICAL DATA:  Status post TAVR. EXAM: PORTABLE CHEST 1 VIEW COMPARISON:  Chest x-ray dated Feb 05, 2020. FINDINGS: The heart size and mediastinal contours are within normal limits. Interval TAVR. Low lung volumes are present, causing crowding of the pulmonary vasculature. No focal consolidation, pleural effusion, or pneumothorax. No acute osseous abnormality. IMPRESSION: 1. Interval TAVR.  No active disease. Electronically Signed   By: Titus Dubin M.D.   On: 02/10/2020 08:40   CT ANGIO CHEST AORTA W/CM & OR WO/CM  Result Date: 01/22/2020 CLINICAL DATA:  79 year old male with history of severe aortic stenosis. Preprocedural study prior to potential  transcatheter aortic valve replacement (TAVR) procedure. EXAM: CT ANGIOGRAPHY CHEST, ABDOMEN AND PELVIS TECHNIQUE: Non-contrast CT of the chest was initially obtained. Multidetector CT imaging through the chest, abdomen and pelvis was performed using the standard protocol during bolus administration of intravenous contrast. Multiplanar reconstructed images and MIPs were obtained and reviewed to evaluate the vascular anatomy. CONTRAST:  152mL OMNIPAQUE IOHEXOL 350 MG/ML SOLN COMPARISON:  None. FINDINGS: CTA CHEST FINDINGS Cardiovascular: Heart size is normal. There is no significant pericardial fluid, thickening or pericardial calcification. There is aortic atherosclerosis, as well as atherosclerosis of the great vessels of the mediastinum and the coronary arteries, including calcified atherosclerotic plaque in the left main, left anterior descending, left circumflex and right coronary arteries. Severe thickening calcification of the aortic valve. Mediastinum/Lymph Nodes: No pathologically enlarged mediastinal or hilar lymph nodes. Esophagus is unremarkable in appearance. No axillary lymphadenopathy. Lungs/Pleura: No suspicious appearing pulmonary nodules or masses are noted. No acute consolidative airspace disease. No pleural effusions. Musculoskeletal/Soft Tissues: Multiple old healed and healing fractures of the right ribs laterally and posterolaterally. Orthopedic fixation hardware noted in the lower cervical spine. There are no aggressive appearing lytic or blastic lesions noted in the visualized portions of the skeleton. CTA ABDOMEN AND PELVIS FINDINGS Hepatobiliary: No suspicious cystic or solid hepatic lesions. Small calcified granulomas in the right lobe of the liver. No intra or extrahepatic biliary ductal dilatation. Amorphous intermediate to high attenuation material lying dependently in the gallbladder may reflect tiny gallstones and/or biliary sludge. No findings to suggest an acute cholecystitis at  this time. Pancreas: No pancreatic mass. No pancreatic ductal dilatation. No pancreatic or peripancreatic fluid collections or inflammatory changes. Spleen: Unremarkable. Adrenals/Urinary Tract: Multifocal cortical thinning in the kidneys bilaterally. No suspicious renal lesions. No hydroureteronephrosis. Bilateral adrenal glands are normal in appearance. Urinary bladder is normal in appearance. Stomach/Bowel: Normal appearance of the stomach. No pathologic dilatation of small bowel or colon. The appendix is not confidently identified and may be surgically absent. Regardless, there are no inflammatory changes noted adjacent to the cecum to suggest the presence  of an acute appendicitis at this time. Vascular/Lymphatic: Aortic atherosclerosis, with vascular findings and measurements pertinent to potential TAVR procedure, as detailed below. No aneurysm or dissection noted in the abdominal or pelvic vasculature. No lymphadenopathy noted in the abdomen or pelvis. Reproductive: Prostate gland and seminal vesicles are unremarkable in appearance. Other: No significant volume of ascites.  No pneumoperitoneum. 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 There are no aggressive appearing lytic or blastic lesions noted in the visualized portions of the skeleton. VASCULAR MEASUREMENTS PERTINENT TO TAVR: AORTA: Minimal Aortic Diameter-11 x 11 mm Severity of Aortic Calcification-severe RIGHT PELVIS: Right Common Iliac Artery - Minimal Diameter-6.9 x 5.9 mm Tortuosity-mild Calcification-moderate Right External Iliac Artery - Minimal Diameter-9.0 x 8.8 mm Tortuosity-moderate Calcification-none Right Common Femoral Artery - Minimal Diameter-9.1 x 7.2 mm Tortuosity-mild Calcification-mild LEFT PELVIS: Left Common Iliac Artery - Minimal Diameter-9.1 x 7.3 mm Tortuosity-mild Calcification-moderate to severe Left External Iliac Artery - Minimal Diameter-9.2 x 8.6 mm Tortuosity-moderate  Calcification-minimal Left Common Femoral Artery - Minimal Diameter-9.1 x 7.4 mm Tortuosity-mild Calcification-mild Review of the MIP images confirms the above findings. IMPRESSION: 1. Vascular findings and measurements pertinent to potential TAVR procedure, as detailed above. 2. Severe thickening calcification of the aortic valve, compatible with reported clinical history of severe aortic stenosis. 3. Aortic atherosclerosis, in addition to left main and 3 vessel coronary artery disease. Assessment for potential risk factor modification, dietary therapy or pharmacologic therapy may be warranted, if clinically indicated. 4. Multiple old healed and healing right-sided rib fractures laterally and posterolaterally. 5. Small amount of biliary sludge and/or gallstones lying dependently in the gallbladder. No findings to suggest an acute cholecystitis at this time. 6. Additional incidental findings, as above. Electronically Signed   By: Vinnie Langton M.D.   On: 01/22/2020 14:04   ECHOCARDIOGRAM LIMITED  Result Date: 02/10/2020    ECHOCARDIOGRAM LIMITED REPORT   Patient Name:   Andre Jimenez Date of Exam: 02/10/2020 Medical Rec #:  [id number]        Height:       67.0 in Accession #:    [id number]       Weight:       204.0 lb Date of Birth:  [birth year]        BSA:          2.039 m Patient Age:    17 years         BP:           116/57 mmHg Patient Gender: M                HR:           71 bpm. Exam Location:  Inpatient Procedure: Limited Echo, Limited Color Doppler, Cardiac Doppler and Intracardiac            Opacification Agent Indications:    Post TAVR evaluation Z95.2  History:        Patient has prior history of Echocardiogram examinations, most                 recent 02/09/2020. CAD, Aortic Valve Disease; Risk                 Factors:Diabetes and Hypertension.                 Aortic Valve: 29 mm Medtronic CoreValve-Evolut Pro prosthetic,                 stented (TAVR) valve  is present in the aortic position.                  Procedure Date: 02/09/2020.  Sonographer:    Mikki Santee RDCS (AE) Referring Phys: [id number] [place]  1. Left ventricular ejection fraction, by estimation, is 55 to 60%. The left ventricle has normal function. The left ventricle has no regional wall motion abnormalities. Left ventricular diastolic parameters are consistent with Grade I diastolic dysfunction (impaired relaxation). Elevated left atrial pressure.  2. Right ventricular systolic function is normal. Tricuspid regurgitation signal is inadequate for assessing PA pressure.  3. Left atrial size was mildly dilated.  4. Mild mitral valve regurgitation.  5. The aortic valve has been repaired/replaced. Aortic valve regurgitation is not visualized. There is a 29 mm Medtronic CoreValve-Evolut Pro prosthetic (TAVR) valve present in the aortic position. Procedure Date: 02/09/2020. Echo findings are consistent  with normal structure and function of the aortic valve prosthesis. Aortic valve mean gradient measures 8.0 mmHg. Aortic valve Vmax measures 1.86 m/s. FINDINGS  Left Ventricle: Left ventricular ejection fraction, by estimation, is 55 to 60%. The left ventricle has normal function. The left ventricle has no regional wall motion abnormalities. Definity contrast agent was given IV to delineate the left ventricular  endocardial borders. The left ventricular internal cavity size was normal in size. There is no left ventricular hypertrophy. Elevated left atrial pressure. Right Ventricle: No increase in right ventricular wall thickness. Right ventricular systolic function is normal. Tricuspid regurgitation signal is inadequate for assessing PA pressure. Left Atrium: Left atrial size was mildly dilated. Right Atrium: Right atrial size was normal in size. Mitral Valve: There is mild thickening of the mitral valve leaflet(s). Mild mitral annular calcification. Mild mitral valve regurgitation. Tricuspid Valve: The tricuspid valve is  normal in structure. Tricuspid valve regurgitation is not demonstrated. Aortic Valve: The aortic valve has been repaired/replaced. Aortic valve regurgitation is not visualized. Aortic valve mean gradient measures 8.0 mmHg. Aortic valve peak gradient measures 13.8 mmHg. Aortic valve area, by VTI measures 1.71 cm. There is a 29 mm Medtronic CoreValve-Evolut Pro prosthetic, stented (TAVR) valve present in the aortic position. Procedure Date: 02/09/2020. Echo findings are consistent with normal structure and function of the aortic valve prosthesis. Pulmonic Valve: The pulmonic valve was grossly normal. Pulmonic valve regurgitation is not visualized. Aorta: The aortic root and ascending aorta are structurally normal, with no evidence of dilitation. IAS/Shunts: No atrial level shunt detected by color flow Doppler.  LEFT VENTRICLE PLAX 2D LVIDd:         5.30 cm  Diastology LVIDs:         3.74 cm  LV e' lateral:   4.53 cm/s LV PW:         1.00 cm  LV E/e' lateral: 15.4 LV IVS:        1.00 cm  LV e' medial:    3.57 cm/s LVOT diam:     2.00 cm  LV E/e' medial:  19.5 LV SV:         78 LV SV Index:   38 LVOT Area:     3.14 cm  LEFT ATRIUM         Index LA diam:    3.70 cm 1.81 cm/m  AORTIC VALVE AV Area (Vmax):    1.59 cm AV Area (Vmean):   1.55 cm AV Area (VTI):     1.71 cm AV Vmax:           186.00  cm/s AV Vmean:          131.000 cm/s AV VTI:            0.456 m AV Peak Grad:      13.8 mmHg AV Mean Grad:      8.0 mmHg LVOT Vmax:         94.20 cm/s LVOT Vmean:        64.500 cm/s LVOT VTI:          0.248 m LVOT/AV VTI ratio: 0.54  AORTA Ao Root diam: 2.70 cm MITRAL VALVE MV Area (PHT): 1.94 cm     SHUNTS MV Decel Time: 391 msec     Systemic VTI:  0.25 m MV E velocity: 69.70 cm/s   Systemic Diam: 2.00 cm MV A velocity: 110.00 cm/s MV E/A ratio:  0.63 Mihai Croitoru MD Electronically signed by Sanda Klein MD Signature Date/Time: 02/10/2020/6:05:35 PM    Final    ECHOCARDIOGRAM LIMITED  Result Date: 02/10/2020     ECHOCARDIOGRAM LIMITED REPORT   Patient Name:   Andre Jimenez Date of Exam: 02/09/2020 Medical Rec #:  [id number]        Height:       67.0 in Accession #:    [id number]       Weight:       204.0 lb Date of Birth:  12-Mar-1941        BSA:          2.039 m Patient Age:    65 years         BP:           50/32 mmHg Patient Gender: M                HR:           64 bpm. Exam Location:  Inpatient Procedure: 2D Echo, Cardiac Doppler and Color Doppler Indications:    TAVR valve  History:        Patient has no prior history of Echocardiogram examinations.                 CAD, Aortic Valve Disease; Risk Factors:Hypertension, Diabetes                 and Dyslipidemia.  Sonographer:    Clayton Lefort RDCS (AE) Referring Phys: [phone] [facility]                 [phone]                  PREPROCEDURAL EXAM:                 Normal left ventricular systolic function. Estimated LVEF 55%.                 There are no regional wall motion abnormalities.                 Severe calcific aortic stenosis. Peak aortic gradient 51 mm Hg,                 mean gradient 28 mm Hg, dimensionless obstructive index 0.23,                 calculated aortic valve area 0.87 cm sq.                 There is mild-moderate aortic insufficiency.                 There is  mild mitral insufficiency.                 There is no pericardial effusion.                  PREPROCEDURAL EXAM:                 Normal left ventricular systolic function. Estimated LVEF                 50-55%. There are no regional wall motion abnormalities.                 Severe calcific aortic stenosis. Peak aortic gradient 19 mm Hg,                 mean gradient 10 mm Hg, dimensionless obstructive index 0.53,                 calculated aortic valve area 2.00 cm sq.                 There is no perivalvular or intravalvular aortic insufficiency.                 There is no mitral insufficiency.                 There is no pericardial effusion. IMPRESSIONS  1. Left ventricular  ejection fraction, by estimation, is 50 to 55%. The left ventricle has low normal function. The left ventricle demonstrates regional wall motion abnormalities (see scoring diagram/findings for description).  2. Right ventricular systolic function is normal. The right ventricular size is normal. Tricuspid regurgitation signal is inadequate for assessing PA pressure.  3. Left atrial size was mildly dilated.  4. The mitral valve is normal in structure. Mild mitral valve regurgitation.  5. The aortic valve is tricuspid. Aortic valve regurgitation is trivial. Severe aortic valve stenosis. FINDINGS  Left Ventricle: Left ventricular ejection fraction, by estimation, is 50 to 55%. The left ventricle has low normal function. The left ventricle demonstrates regional wall motion abnormalities. The left ventricular internal cavity size was normal in size. Right Ventricle: The right ventricular size is normal. No increase in right ventricular wall thickness. Right ventricular systolic function is normal. Tricuspid regurgitation signal is inadequate for assessing PA pressure. Left Atrium: Left atrial size was mildly dilated. Right Atrium: Right atrial size was normal in size. Pericardium: There is no evidence of pericardial effusion. Mitral Valve: The mitral valve is normal in structure. There is mild thickening of the mitral valve leaflet(s). Mild mitral annular calcification. Mild mitral valve regurgitation. Tricuspid Valve: The tricuspid valve is normal in structure. Tricuspid valve regurgitation is not demonstrated. Aortic Valve: The aortic valve is tricuspid. . There is severe thickening and severe calcifcation of the aortic valve. Aortic valve regurgitation is trivial. Aortic regurgitation PHT measures 301 msec. Severe aortic stenosis is present. There is severe thickening of the aortic valve. There is severe calcifcation of the aortic valve. Aortic valve mean gradient measures 10.0 mmHg. Aortic valve peak gradient measures  18.5 mmHg. Aortic valve area, by VTI measures 2.00 cm. Pulmonic Valve: The pulmonic valve was not assessed. Aorta: The aortic root and ascending aorta are structurally normal, with no evidence of dilitation. IAS/Shunts: The interatrial septum was not assessed.  LEFT VENTRICLE PLAX 2D LVOT diam:     2.20 cm LV SV:         97 LV SV Index:   47 LVOT Area:     3.80  cm  AORTIC VALVE AV Area (Vmax):    1.98 cm AV Area (Vmean):   2.03 cm AV Area (VTI):     2.00 cm AV Vmax:           215.00 cm/s AV Vmean:          146.000 cm/s AV VTI:            0.483 m AV Peak Grad:      18.5 mmHg AV Mean Grad:      10.0 mmHg LVOT Vmax:         112.00 cm/s LVOT Vmean:        78.000 cm/s LVOT VTI:          0.254 m LVOT/AV VTI ratio: 0.53 AI PHT:            301 msec  SHUNTS Systemic VTI:  0.25 m Systemic Diam: 2.20 cm Sanda Klein MD Electronically signed by Sanda Klein MD Signature Date/Time: 02/10/2020/6:00:31 PM    Final    Structural Heart Procedure  Result Date: 02/09/2020 See surgical note for result.  CT Angio Abd/Pel w/ and/or w/o  Result Date: 01/22/2020 CLINICAL DATA:  79 year old male with history of severe aortic stenosis. Preprocedural study prior to potential transcatheter aortic valve replacement (TAVR) procedure. EXAM: CT ANGIOGRAPHY CHEST, ABDOMEN AND PELVIS TECHNIQUE: Non-contrast CT of the chest was initially obtained. Multidetector CT imaging through the chest, abdomen and pelvis was performed using the standard protocol during bolus administration of intravenous contrast. Multiplanar reconstructed images and MIPs were obtained and reviewed to evaluate the vascular anatomy. CONTRAST:  13mL OMNIPAQUE IOHEXOL 350 MG/ML SOLN COMPARISON:  None. FINDINGS: CTA CHEST FINDINGS Cardiovascular: Heart size is normal. There is no significant pericardial fluid, thickening or pericardial calcification. There is aortic atherosclerosis, as well as atherosclerosis of the great vessels of the mediastinum and the coronary  arteries, including calcified atherosclerotic plaque in the left main, left anterior descending, left circumflex and right coronary arteries. Severe thickening calcification of the aortic valve. Mediastinum/Lymph Nodes: No pathologically enlarged mediastinal or hilar lymph nodes. Esophagus is unremarkable in appearance. No axillary lymphadenopathy. Lungs/Pleura: No suspicious appearing pulmonary nodules or masses are noted. No acute consolidative airspace disease. No pleural effusions. Musculoskeletal/Soft Tissues: Multiple old healed and healing fractures of the right ribs laterally and posterolaterally. Orthopedic fixation hardware noted in the lower cervical spine. There are no aggressive appearing lytic or blastic lesions noted in the visualized portions of the skeleton. CTA ABDOMEN AND PELVIS FINDINGS Hepatobiliary: No suspicious cystic or solid hepatic lesions. Small calcified granulomas in the right lobe of the liver. No intra or extrahepatic biliary ductal dilatation. Amorphous intermediate to high attenuation material lying dependently in the gallbladder may reflect tiny gallstones and/or biliary sludge. No findings to suggest an acute cholecystitis at this time. Pancreas: No pancreatic mass. No pancreatic ductal dilatation. No pancreatic or peripancreatic fluid collections or inflammatory changes. Spleen: Unremarkable. Adrenals/Urinary Tract: Multifocal cortical thinning in the kidneys bilaterally. No suspicious renal lesions. No hydroureteronephrosis. Bilateral adrenal glands are normal in appearance. Urinary bladder is normal in appearance. Stomach/Bowel: Normal appearance of the stomach. No pathologic dilatation of small bowel or colon. The appendix is not confidently identified and may be surgically absent. Regardless, there are no inflammatory changes noted adjacent to the cecum to suggest the presence of an acute appendicitis at this time. Vascular/Lymphatic: Aortic atherosclerosis, with vascular  findings and measurements pertinent to potential TAVR procedure, as detailed below. No aneurysm or dissection noted in the abdominal  or pelvic vasculature. No lymphadenopathy noted in the abdomen or pelvis. Reproductive: Prostate gland and seminal vesicles are unremarkable in appearance. Other: No significant volume of ascites.  No pneumoperitoneum. 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 There are no aggressive appearing lytic or blastic lesions noted in the visualized portions of the skeleton. VASCULAR MEASUREMENTS PERTINENT TO TAVR: AORTA: Minimal Aortic Diameter-11 x 11 mm Severity of Aortic Calcification-severe RIGHT PELVIS: Right Common Iliac Artery - Minimal Diameter-6.9 x 5.9 mm Tortuosity-mild Calcification-moderate Right External Iliac Artery - Minimal Diameter-9.0 x 8.8 mm Tortuosity-moderate Calcification-none Right Common Femoral Artery - Minimal Diameter-9.1 x 7.2 mm Tortuosity-mild Calcification-mild LEFT PELVIS: Left Common Iliac Artery - Minimal Diameter-9.1 x 7.3 mm Tortuosity-mild Calcification-moderate to severe Left External Iliac Artery - Minimal Diameter-9.2 x 8.6 mm Tortuosity-moderate Calcification-minimal Left Common Femoral Artery - Minimal Diameter-9.1 x 7.4 mm Tortuosity-mild Calcification-mild Review of the MIP images confirms the above findings. IMPRESSION: 1. Vascular findings and measurements pertinent to potential TAVR procedure, as detailed above. 2. Severe thickening calcification of the aortic valve, compatible with reported clinical history of severe aortic stenosis. 3. Aortic atherosclerosis, in addition to left main and 3 vessel coronary artery disease. Assessment for potential risk factor modification, dietary therapy or pharmacologic therapy may be warranted, if clinically indicated. 4. Multiple old healed and healing right-sided rib fractures laterally and posterolaterally. 5. Small amount of biliary sludge and/or gallstones  lying dependently in the gallbladder. No findings to suggest an acute cholecystitis at this time. 6. Additional incidental findings, as above. Electronically Signed   By: Vinnie Langton M.D.   On: 01/22/2020 14:04   Disposition   Pt is being discharged home today in good condition.  Follow-up Plans & Appointments    Follow-up Information    Eileen Stanford, PA-C. Go on 02/18/2020.   Specialties: Cardiology, Radiology Why: @ 1pm, please arrive at least 10 minutes early. Contact information: [address] [place] [address] [phone]            Discharge Medications   Allergies as of 02/12/2020      Reactions   Ivp Dye [iodinated Diagnostic Agents] Anaphylaxis   Had anaphylactic reaction after TAVR- potentially related to contrast dye   Protamine Anaphylaxis   Had potential anaphylactic shock after protamine administration after TAVR      Medication List    TAKE these medications   amLODipine 10 MG tablet Commonly known as: NORVASC Take 10 mg by mouth daily.   aspirin EC 81 MG tablet Take 81 mg by mouth daily.   atorvastatin 40 MG tablet Commonly known as: LIPITOR Take 1 tablet (40 mg total) by mouth daily.   clopidogrel 75 MG tablet Commonly known as: PLAVIX Take 1 tablet (75 mg total) by mouth daily.   Fluocinolone Acetonide 0.01 % Oil Place 1 drop in ear(s) daily as needed (itching).   glipiZIDE 5 MG 24 hr tablet Commonly known as: GLUCOTROL XL Take 5 mg by mouth in the morning and at bedtime.   metFORMIN 1000 MG tablet Commonly known as: GLUCOPHAGE Take 1,000 mg by mouth 2 (two) times daily with a meal.   pantoprazole 40 MG tablet Commonly known as: PROTONIX Take 1 tablet (40 mg total) by mouth daily.   telmisartan-hydrochlorothiazide 40-12.5 MG tablet Commonly known as: MICARDIS HCT Take 1 tablet by mouth daily.            Durable Medical Equipment  (From admission, onward)  Start     Ordered    02/12/20 1055  For home use only DME 4 wheeled rolling walker with seat  Once    Question:  Patient needs a walker to treat with the following condition  Answer:  Foot drop   02/12/20 1054              Outstanding Labs/Studies   BMET  Duration of Discharge Encounter   Greater than 30 minutes including physician time.  SignedAngelena Form, PA-C 02/12/2020, 10:56 AM [phone]  Patient seen, examined. Available data reviewed. Agree with findings, assessment, and plan as outlined by Nell Range, PA-C.  The patient is independently interviewed and examined.  His wife is at the bedside.  He is alert, oriented, in no distress.  Lungs are clear, heart is regular rate and rhythm with a soft ejection murmur at the right upper sternal border, abdomen is soft and nontender, extremities have trace edema.  The patient has had a good recovery after suffering from anaphylactic shock following TAVR.  We have added both protamine and iodinated contrast to his allergy list as it is unclear which of these he reacted to.  Fortunately he has done very well over the last 24 to 48 hours and is now recovering nicely.  We reviewed postprocedural instructions and medications today.  Follow-up is arranged as outlined.  Sherren Mocha, M.D. 02/12/2020 5:12 PM

## 2020-02-12 DIAGNOSIS — I5033 Acute on chronic diastolic (congestive) heart failure: Secondary | ICD-10-CM

## 2020-02-12 DIAGNOSIS — T782XXA Anaphylactic shock, unspecified, initial encounter: Secondary | ICD-10-CM

## 2020-02-12 HISTORY — DX: Acute on chronic diastolic (congestive) heart failure: I50.33

## 2020-02-12 LAB — CBC
HCT: 33.2 % — ABNORMAL LOW (ref 39.0–52.0)
Hemoglobin: 10.9 g/dL — ABNORMAL LOW (ref 13.0–17.0)
MCH: 29.2 pg (ref 26.0–34.0)
MCHC: 32.8 g/dL (ref 30.0–36.0)
MCV: 89 fL (ref 80.0–100.0)
Platelets: 150 10*3/uL (ref 150–400)
RBC: 3.73 MIL/uL — ABNORMAL LOW (ref 4.22–5.81)
RDW: 12.7 % (ref 11.5–15.5)
WBC: 14.6 10*3/uL — ABNORMAL HIGH (ref 4.0–10.5)
nRBC: 0 % (ref 0.0–0.2)

## 2020-02-12 LAB — GLUCOSE, CAPILLARY
Glucose-Capillary: 97 mg/dL (ref 70–99)
Glucose-Capillary: 124 mg/dL — ABNORMAL HIGH (ref 70–99)

## 2020-02-12 NOTE — Plan of Care (Signed)
DISCHARGE NOTE HOME Andre Jimenez to be discharged home per MD order. Discussed prescriptions and follow up appointments with the patient. Prescriptions given to patient; medication list explained in detail. Patient verbalized understanding.  Skin clean, dry and intact without evidence of skin break down, no evidence of skin tears noted. IV catheter discontinued intact. Site without signs and symptoms of complications. Dressing and pressure applied. Pt denies pain at the site currently. No complaints noted.  Patient free of lines, drains, and wounds.   An After Visit Summary (AVS) was printed and given to the patient. Patient to be escorted via wheelchair, and discharged home via private auto.  Stephan Minister, RN

## 2020-02-12 NOTE — Progress Notes (Signed)
CARDIAC REHAB PHASE I   PRE:  Rate/Rhythm: 58 SB  BP:  Supine:   Sitting: 153/65  Standing:    SaO2: 96%RA  MODE:  Ambulation: 200 ft   POST:  Rate/Rhythm: 75 SR  BP:  Supine:   Sitting: 158/67  Standing:    SaO2: 95%RA 1031-1100 Pt sitting in chair and dressed with shoes on. Stated he walked with wife earlier but he had footies on and he used rolling walker. Pt dragged his right foot with [place] yesterday so I wanted to walk and see if he continued this. Pt walked 200 ft on RA with rolling walker and would hit his toe with every other step. Seemed a little worse with the shoes. Pt stated he did not know why he could not pick foot up and he stated he did not do this prior to hospitalization. Would recommend rolling walker until this improves. Notified PA of need for walker and not picking foot up completely. Pt gave approval to send referral letter to [facility] for CRP 2.    Graylon Good, RN BSN  02/12/2020 11:12 AM

## 2020-02-12 NOTE — TOC Transition Note (Signed)
Transition of Care [facility]) - CM/SW Discharge Note Marvetta Gibbons RN, BSN Transitions of Care Unit 4E- RN Case Manager [phone]   Patient Details  Name: Andre Jimenez MRN: [id number] Date of Birth: 1941-04-22  [facility]) CM/SW Contact:  Dawayne Patricia, RN Phone Number: 02/12/2020, 11:49 AM   Clinical Narrative:    Pt stable for transition home, order placed for rollator, call made to [facility] with [employer] for DME need- rollator to be delivered to room prior to discharge.    Final next level of care: Home/Self Care Barriers to Discharge: No Barriers Identified   Patient Goals and CMS Choice        Discharge Placement        home               Discharge Plan and Services                DME Arranged: Walker rolling with seat DME Agency: [employer] Date DME Agency Contacted: 02/12/20 Time DME Agency Contacted: [id number] Representative spoke with at DME Agency: [place]: NA [place] Agency: NA        Social Determinants of Health ([place]) Interventions     Readmission Risk Interventions Readmission Risk Prevention Plan 02/12/2020  Post Dischage Appt Complete  Medication Screening Complete  Transportation Screening Complete  Some recent data might be hidden

## 2020-02-12 NOTE — Care Management Important Message (Signed)
Important Message  Patient Details  Name: TANAY POMERANZ MRN: [id number] Date of Birth: 1940/10/18   Medicare Important Message Given:  Yes     Shelda Altes 02/12/2020, 11:04 AM

## 2020-02-14 ENCOUNTER — Emergency Department (HOSPITAL_COMMUNITY)
Admission: EM | Admit: 2020-02-14 | Discharge: 2020-02-14 | Disposition: A | Discharge status: Home / Self Care | Payer: Medicare Other | Attending: Emergency Medicine | Admitting: Emergency Medicine

## 2020-02-14 ENCOUNTER — Encounter (HOSPITAL_COMMUNITY): Payer: Self-pay | Admitting: Emergency Medicine

## 2020-02-14 DIAGNOSIS — I110 Hypertensive heart disease with heart failure: Secondary | ICD-10-CM | POA: Insufficient documentation

## 2020-02-14 DIAGNOSIS — I509 Heart failure, unspecified: Secondary | ICD-10-CM | POA: Diagnosis not present

## 2020-02-14 DIAGNOSIS — Z7902 Long term (current) use of antithrombotics/antiplatelets: Secondary | ICD-10-CM | POA: Insufficient documentation

## 2020-02-14 DIAGNOSIS — I97618 Postprocedural hemorrhage and hematoma of a circulatory system organ or structure following other circulatory system procedure: Secondary | ICD-10-CM | POA: Insufficient documentation

## 2020-02-14 DIAGNOSIS — Z9889 Other specified postprocedural states: Secondary | ICD-10-CM | POA: Diagnosis not present

## 2020-02-14 DIAGNOSIS — Z952 Presence of prosthetic heart valve: Secondary | ICD-10-CM | POA: Diagnosis not present

## 2020-02-14 DIAGNOSIS — I2510 Atherosclerotic heart disease of native coronary artery without angina pectoris: Secondary | ICD-10-CM | POA: Insufficient documentation

## 2020-02-14 DIAGNOSIS — T148XXA Other injury of unspecified body region, initial encounter: Secondary | ICD-10-CM

## 2020-02-14 DIAGNOSIS — Z79899 Other long term (current) drug therapy: Secondary | ICD-10-CM | POA: Insufficient documentation

## 2020-02-14 NOTE — ED Notes (Signed)
Pt bandages redressed. Bilateral at insertion site

## 2020-02-14 NOTE — ED Triage Notes (Signed)
Pt arrives via [facility], pt had aortic valve replacement 5 days ago, was told they could remove bandages to groin after 1 week, pts wife removed them today and noticed some bleeding from the L site. Ems reports that upon their arrival there was minimal bleeding at site. Pt does take eliquis. Ems vss 150/66, HR 70, 98% on ra, RR 18, a/ox4. Resp e/u,nad. Bleeding controlled with 4x4 from ems.

## 2020-02-14 NOTE — Discharge Instructions (Addendum)
Hold pressure to area for 20 minutes if any further bleeding

## 2020-02-14 NOTE — ED Provider Notes (Signed)
[facility] EMERGENCY DEPARTMENT Provider Note   CSN: [id number] Arrival date & time: 02/14/20  1410     History No chief complaint on file.   Andre Jimenez is a 79 y.o. male.  Pt's wife reports pt had bleeding from cath site on left groin,  Pt had an aortic valve replacement.  Pt had cath on right and left side on 5/11.  Right side is healing well.  Pt's wife noticed some bleeding from left side today.  No current bleeding. Pt denies any other complaints.   The history is provided by the patient. No language interpreter was used.       Past Medical History:  Diagnosis Date  . Arthritis   . Bilateral hearing loss   . Carotid artery disease ([place])   . Carpal tunnel syndrome on left   . Coronary artery disease   . Essential hypertension   . GERD (gastroesophageal reflux disease)    WITH ESOPHAGITIS  . Gout   . Mixed hyperlipidemia   . 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 Severe aortic stenosis   . Type II diabetes mellitus (HCC)    Type II  . Vitamin D deficiency     Patient Active Problem List   Diagnosis Date Noted  . Anaphylaxis 02/12/2020  . Acute on chronic diastolic heart failure ([place]) 02/12/2020  . 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 Severe aortic stenosis   . Carotid artery disease ([place])   . Coronary artery disease involving native coronary artery of native heart with angina pectoris ([place])   . Essential hypertension   . Type II diabetes mellitus ([place])   . Mixed hyperlipidemia   . Gout   . Vitamin D deficiency   . Carpal tunnel syndrome - left   . GERD (gastroesophageal reflux disease)   . Bilateral hearing loss     Past Surgical History:  Procedure Laterality Date  . CARPAL TUNNEL RELEASE Bilateral   . CERVICAL FUSION    . CORONARY STENT INTERVENTION N/A 01/28/2020   Procedure: CORONARY STENT INTERVENTION;  Surgeon: Sherren Mocha, MD;  Location: [facility];  Service: Cardiovascular;  Laterality: N/A;  . ESOPHAGOGASTRODUODENOSCOPY (EGD) WITH ESOPHAGEAL DILATION    . INTRAVASCULAR PRESSURE WIRE/FFR STUDY N/A 01/28/2020   Procedure: INTRAVASCULAR PRESSURE WIRE/FFR STUDY;  Surgeon: Sherren Mocha, MD;  Location: [facility];  Service: Cardiovascular;  Laterality: N/A;  . LUMBAR FUSION    . Rotar cuff repair Right   . TEE WITHOUT CARDIOVERSION N/A 02/09/2020   Procedure: TRANSESOPHAGEAL ECHOCARDIOGRAM (TEE);  Surgeon: Sherren Mocha, MD;  Location: [facility];  Service: Open Heart Surgery;  Laterality: N/A;  . TRANSCATHETER AORTIC VALVE REPLACEMENT, TRANSFEMORAL Left 02/09/2020   Procedure: TRANSCATHETER AORTIC VALVE REPLACEMENT, LEFT TRANSFEMORAL;  Surgeon: Sherren Mocha, MD;  Location: [facility];  Service: Open Heart Surgery;  Laterality: Left;       No family history on file.  Social History   Tobacco Use  . Smoking status: Former Smoker    Types: Cigars  . Smokeless tobacco: Former Systems developer    Quit date: 2020  . Tobacco comment: "many years ago"  Substance Use Topics  . Alcohol use: Not Currently  . Drug use: Not Currently    Home Medications Prior to Admission medications   Medication Sig Start Date End Date Taking? Authorizing  Provider  amLODipine (NORVASC) 10 MG tablet Take 10 mg by mouth daily.    [provider]  aspirin EC 81 MG tablet Take 81 mg by mouth daily.    [provider]  atorvastatin (LIPITOR) 40 MG tablet Take 1 tablet (40 mg total) by mouth daily. 01/28/20   Darreld Mclean, PA-C  clopidogrel (PLAVIX) 75 MG tablet Take 1 tablet (75 mg total) by mouth daily. 01/25/20   Sherren Mocha, MD  Fluocinolone Acetonide 0.01 % OIL Place 1 drop in ear(s) daily as needed (itching).    [provider]  glipiZIDE (GLUCOTROL XL) 5 MG 24 hr tablet Take 5 mg by mouth in the morning  and at bedtime.    [provider]  metFORMIN (GLUCOPHAGE) 1000 MG tablet Take 1,000 mg by mouth 2 (two) times daily with a meal.    [provider]  pantoprazole (PROTONIX) 40 MG tablet Take 1 tablet (40 mg total) by mouth daily. 01/25/20   Sherren Mocha, MD  telmisartan-hydrochlorothiazide (MICARDIS HCT) 40-12.5 MG tablet Take 1 tablet by mouth daily.    [provider]    Allergies    Ivp dye [iodinated diagnostic agents] and Protamine  Review of Systems   Review of Systems  All other systems reviewed and are negative.   Physical Exam Updated Vital Signs BP (!) 131/93   Pulse 66   Temp 98.2 F (36.8 C) (Oral)   Resp 12   SpO2 99%   Physical Exam Vitals and nursing note reviewed.  Constitutional:      Appearance: He is well-developed.  HENT:     Head: Normocephalic and atraumatic.  Eyes:     Conjunctiva/sclera: Conjunctivae normal.  Cardiovascular:     Rate and Rhythm: Normal rate.     Heart sounds: No murmur.  Pulmonary:     Effort: Pulmonary effort is normal. No respiratory distress.  Abdominal:     General: There is no distension.     Tenderness: There is no abdominal tenderness.  Musculoskeletal:        General: Normal range of motion.     Cervical back: Normal range of motion and neck supple.     Comments: Right groin, small puncture, no bleeding currently, left groin small drop of blood on dressing, no active bleeding   Skin:    General: Skin is warm.  Neurological:     Mental Status: He is alert and oriented to person, place, and time.  Psychiatric:        Mood and Affect: Mood normal.     ED Results / Procedures / Treatments   Labs (all labs ordered are listed, but only abnormal results are displayed) Labs Reviewed - No data to display  EKG None  Radiology No results found.  Procedures Procedures (including critical care time)  Medications Ordered in ED Medications - No data to display  ED Course  I have  reviewed the triage vital signs and the nursing notes.  Pertinent labs & imaging results that were available during my care of the patient were reviewed by me and considered in my medical decision making (see chart for details).    MDM Rules/Calculators/A&P                      Dressing to bilat groin access sites  Final Clinical Impression(s) / ED Diagnoses Final diagnoses:  Bleeding from wound    Rx / DC Orders ED Discharge Orders    None  An After Visit Summary was printed and given to the patient.    Fransico Meadow, [place] 02/14/20 1751    Wyvonnia Dusky, MD 02/14/20 2325

## 2020-02-15 ENCOUNTER — Telehealth: Payer: Self-pay | Admitting: Physician Assistant

## 2020-02-15 NOTE — Progress Notes (Signed)
[facility]                                       Cardiology Office Note    Date:  02/18/2020   ID:  Andre Jimenez, DOB [birth year], MRN [id number]  PCP:  Myrlene Broker, MD  Cardiologist:  Dr. Otho Perl / Dr. Burt Knack & Dr. Roxy Manns (TAVR)  CC: [facility] s/p TAVR  History of Present Illness:  Andre Jimenez is a 79 y.o. 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   The patient reports a longstanding heart murmur since he was a young man. He has been physically active throughout most of his life until this past winter when he describes the onset of exertional angina and shortness of breath.The patient underwent formal cardiac evaluation and an echocardiogram demonstrated findings consistent with severe aortic stenosis with a peak transaortic velocity of 3.8 m/s, mean transvalvular gradient of 33 mmHg, and dimensionless index of 0.24. He then underwent cardiac catheterization confirming the presence of severe aortic stenosis with a mean transvalvular gradient of 38 mmHg and calculated aortic valve area of 0.94 cm. He was found to have severe diffuse proximal LAD stenosis and moderately severe stenosis of the large second obtuse marginal branch of the left circumflex. The patient was seen in surgical consultation by Dr. Roxy Manns. He underwent CTA studies of the heart as well as the chest, abdomen, and pelvis. After review of his studies and further discussion with the patient, recommendations were made for PCI followed by TAVR for treatment of his coronary artery disease and aortic stenosis, respectively.He underwent sucessful PCI of the proximal LAD with implantation of a 3.0 x 38 mm resolute Onyx DES. There was negative RFR analysis of moderate stenosis in a large second obtuse marginal branch of the  circumflex.  He was evaluated by the multidisciplinary valve team and underwent successful TAVR with a 29 mm Medtronic Evolut Pro + 3 THV via the TF approach on 02/09/20. His hospital course was complicated by non-cardiogenic shock shortly after valve deployment, felt to be most likely related to anaphylaxis to either contrast dye or protamine. He was treated with IV solumedrol 125 mg, benadryl 50 mg IV, and an epinephrine gtt. He required inotropic support overnight and developed AKI. He then quickly recovered and creat improved to 1.56. Post operative echo showed EF 55-60% normally functioning TAVR with a mean gradient of 8 mm Hg and no PVL. He was discharged on POD 3 on aspirin and plavix. His foot was bothering him and cardiac rehab felt he had foot drop. A walker was ordered.  Today he presents to clinic for follow up. Has gout in right foot. No CP or SOB. No LE edema, orthopnea or PND. No dizziness or syncope. No blood in stool or urine. No palpitations. Otherwise doing quite well.  Past Medical History:  Diagnosis Date  . Arthritis   . Bilateral hearing loss   . Carotid artery disease ([employer])   . Carpal tunnel syndrome on left   . Coronary artery disease   . Essential hypertension   . GERD (gastroesophageal reflux disease)    WITH ESOPHAGITIS  . Gout   . Mixed hyperlipidemia   . 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 Severe aortic stenosis   . Type II diabetes mellitus (HCC)    Type II  . Vitamin D deficiency     Past Surgical History:  Procedure Laterality Date  . CARPAL TUNNEL RELEASE Bilateral   . CERVICAL FUSION    . CORONARY STENT INTERVENTION N/A 01/28/2020   Procedure: CORONARY STENT INTERVENTION;  Surgeon: Sherren Mocha, MD;  Location: [facility];  Service: Cardiovascular;  Laterality: N/A;  . ESOPHAGOGASTRODUODENOSCOPY (EGD) WITH  ESOPHAGEAL DILATION    . INTRAVASCULAR PRESSURE WIRE/FFR STUDY N/A 01/28/2020   Procedure: INTRAVASCULAR PRESSURE WIRE/FFR STUDY;  Surgeon: Sherren Mocha, MD;  Location: [facility];  Service: Cardiovascular;  Laterality: N/A;  . LUMBAR FUSION    . Rotar cuff repair Right   . TEE WITHOUT CARDIOVERSION N/A 02/09/2020   Procedure: TRANSESOPHAGEAL ECHOCARDIOGRAM (TEE);  Surgeon: Sherren Mocha, MD;  Location: [facility];  Service: Open Heart Surgery;  Laterality: N/A;  . TRANSCATHETER AORTIC VALVE REPLACEMENT, TRANSFEMORAL Left 02/09/2020   Procedure: TRANSCATHETER AORTIC VALVE REPLACEMENT, LEFT TRANSFEMORAL;  Surgeon: Sherren Mocha, MD;  Location: [facility];  Service: Open Heart Surgery;  Laterality: Left;    Current Medications: Outpatient Medications Prior to Visit  Medication Sig Dispense Refill  . amLODipine (NORVASC) 10 MG tablet Take 10 mg by mouth daily.    Marland Kitchen aspirin EC 81 MG tablet Take 81 mg by mouth daily.    Marland Kitchen atorvastatin (LIPITOR) 40 MG tablet Take 1 tablet (40 mg total) by mouth daily. 30 tablet 3  . clopidogrel (PLAVIX) 75 MG tablet Take 1 tablet (75 mg total) by mouth daily. 90 tablet 3  . Fluocinolone Acetonide 0.01 % OIL Place 1 drop in ear(s) daily as needed (itching).    Marland Kitchen glipiZIDE (GLUCOTROL XL) 5 MG 24 hr tablet Take 5 mg by mouth in the morning and at bedtime.    . metFORMIN (GLUCOPHAGE) 1000 MG tablet Take 1,000 mg by mouth 2 (two) times daily with a meal.    . pantoprazole (PROTONIX) 40 MG tablet Take 1 tablet (40 mg total) by mouth daily. 90 tablet 3  . telmisartan-hydrochlorothiazide (MICARDIS HCT) 40-12.5 MG tablet Take 1 tablet by mouth daily.     No facility-administered medications prior to visit.     Allergies:   Ivp dye [iodinated diagnostic agents] and Protamine   Social History   Socioeconomic History  . Marital status: Married    Spouse name: Not on file  . Number of children: Not on file  . Years of education: Not on  file  . Highest education level: Not on file  Occupational History  . Not on file  Tobacco Use  . Smoking status: Former Smoker    Types: Cigars  . Smokeless tobacco: Former Systems developer    Quit date: 2020  . Tobacco comment: "many years ago"  Substance and Sexual Activity  . Alcohol use: Not Currently  . Drug use: Not Currently  . Sexual activity: Not on file  Other Topics Concern  . Not on file  Social History Narrative  . Not on file   Social Determinants of Health   Financial Resource Strain:   . Difficulty of Paying Living Expenses:   Food Insecurity:   . Worried About Charity fundraiser in the Last Year:   . [place] in the  Last Year:   Transportation Needs:   . Film/video editor (Medical):   Marland Kitchen Lack of Transportation (Non-Medical):   Physical Activity:   . Days of Exercise per Week:   . Minutes of Exercise per Session:   Stress:   . Feeling of Stress :   Social Connections:   . Frequency of Communication with Friends and Family:   . Frequency of Social Gatherings with Friends and Family:   . Attends Religious Services:   . Active Member of Clubs or Organizations:   . Attends Archivist Meetings:   Marland Kitchen Marital Status:      Family History:  The patient's family history is not on file.     ROS:   Please see the history of present illness.    ROS All other systems reviewed and are negative.   PHYSICAL EXAM:   VS:  BP (!) 116/56   Pulse 66   Ht 5\' 7"  (1.702 m)   Wt 196 lb (88.9 kg)   SpO2 96%   BMI 30.70 kg/m    GEN: Well nourished, well developed, in no acute distress HEENT: normal Neck: no JVD or masses Cardiac: RRR; no murmurs, rubs, or gallops,no edema  Respiratory:  clear to auscultation bilaterally, normal work of breathing GI: soft, nontender, nondistended, + BS MS: no deformity or atrophy Skin: warm and dry, no rash.  Groin sites clear without hematoma. Mild ecchymosis on left side Neuro:  Alert and Oriented x 3, Strength and  sensation are intact Psych: euthymic mood, full affect   Wt Readings from Last 3 Encounters:  02/18/20 196 lb (88.9 kg)  02/12/20 203 lb 4.2 oz (92.2 kg)  02/05/20 204 lb 6.4 oz (92.7 kg)      Studies/Labs Reviewed:   EKG:  EKG is ordered today.  The ekg ordered today demonstrates sinus with HR 67  Recent Labs: 02/05/2020: B Natriuretic Peptide 146.2 02/09/2020: ALT 12 02/11/2020: BUN 27; Creatinine, Ser 1.56; Magnesium 1.7; Potassium 4.0; Sodium 138 02/12/2020: Hemoglobin 10.9; Platelets 150   Lipid Panel No results found for: CHOL, TRIG, HDL, CHOLHDL, VLDL, LDLCALC, LDLDIRECT  Additional studies/ records that were reviewed today include:  TAVR OPERATIVE NOTE   Date of Procedure:02/09/2020  Preoperative Diagnosis:Severe Aortic Stenosis   Postoperative Diagnosis:Same   Procedure:   Transcatheter Aortic Valve Replacement - PercutaneousLeftTransfemoral Approach Medtronic CoreValve Evolut Pro (size 41mm, serial # [id number])  Co-Surgeons:Clarence H. Roxy Manns, MD and Sherren Mocha, MD  Anesthesiologist:John Lissa Hoard, MD  Echocardiographer:Mihai Croitoru, MD  Pre-operative Echo Findings: ? Severe aortic stenosis ? Normalleft ventricular systolic function  Post-operative Echo Findings: ? Noparavalvular leak ? Normalleft ventricular systolic function  _____________   Echo 02/10/20:  IMPRESSIONS  1. Left ventricular ejection fraction, by estimation, is 55 to 60%. The  left ventricle has normal function. The left ventricle has no regional  wall motion abnormalities. Left ventricular diastolic parameters are  consistent with Grade I diastolic  dysfunction (impaired relaxation). Elevated left atrial pressure.  2. Right ventricular systolic function is normal. Tricuspid regurgitation  signal is inadequate for assessing PA pressure.  3. Left  atrial size was mildly dilated.  4. Mild mitral valve regurgitation.  5. The aortic valve has been repaired/replaced. Aortic valve  regurgitation is not visualized. There is a 29 mm Medtronic  CoreValve-Evolut Pro prosthetic (TAVR) valve present in the aortic  position. Procedure Date: 02/09/2020. Echo findings are consistent  with normal structure and function of the aortic valve prosthesis. Aortic  valve mean gradient measures  8.0 mmHg. Aortic valve Vmax measures 1.86 m/s.   ASSESSMENT & PLAN:   Severe AS s/p TAVR: doing well. Groin sites healing well. ECG with sinus and no HAVB. Groin sites are stable. Continue on aspirin and plavix. He gets amoxicillin from the density. I will see him back next month for follow up and echo.   AKI: during admission 2/2 to anaphylactic shock. Will check BMET today  CAD: pre TAVR cath showed revealed high-grade focal severe stenosis of the mid left anterior descending coronary artery and moderate stenosis of proximal segment of the large second obtuse marginal branch of the left circumflex coronary artery. There was nonobstructive disease in the right coronary circulation. He is s/p successful PCI/DES to pLAD and negative FFR of moderate stenosis of large OM2 of LCx on 4/29. DAPT for at least 6 months  Chronic diastolic CHF: appears euvolemic. Continue Micardis HCT. BMET today  HTN: BP well controlled. Continue current regimen  Gout: in right foot. Will Rx colchicine.   Medication Adjustments/Labs and Tests Ordered: Current medicines are reviewed at length with the patient today.  Concerns regarding medicines are outlined above.  Medication changes, Labs and Tests ordered today are listed in the Patient Instructions below. Patient Instructions  Medication Instructions:  1) you have been given a prescription for COLCHICINE 0.6 mg. Take 1.2 mg (2 tablets) when you receive your prescription and then 0.6 mg (1 tablet) 1 hour later. *If you need a  refill on your cardiac medications before your next appointment, please call your pharmacy*  Lab Work: TODAY! BMET If you have labs (blood work) drawn today and your tests are completely normal, you will receive your results only by: Marland Kitchen MyChart Message (if you have MyChart) OR . A paper copy in the mail If you have any lab test that is abnormal or we need to change your treatment, we will call you to review the results.   Follow-Up: Please keep your follow-up appointments as scheduled!    Signed, Angelena Form, PA-C  02/18/2020 1:33 PM    [facility] [place], [place], [place]  [address] Phone: [phone]; Fax: [phone]

## 2020-02-15 NOTE — Telephone Encounter (Signed)
Left message that all appropriate orders will be placed at visit 5/20.

## 2020-02-15 NOTE — Telephone Encounter (Signed)
New message   Patient's wife would like a call in reference to her husband having physical therapy on his leg. Please call.

## 2020-02-15 NOTE — Telephone Encounter (Signed)
  [facility] VALVE TEAM   Patient contacted regarding discharge from [facility] on 5/14  Patient understands to follow up with provider Nell Range on 5/20 at [facility].  Patient understands discharge instructions? yes Patient understands medications and regimen? yes Patient understands to bring all medications to this visit? yes  Angelena Form PA-C  [facility]

## 2020-02-16 ENCOUNTER — Telehealth (HOSPITAL_COMMUNITY): Payer: Self-pay

## 2020-02-16 NOTE — Telephone Encounter (Signed)
Faxed referral to [facility] for Cardiac Rehab. 

## 2020-02-18 ENCOUNTER — Encounter: Payer: Self-pay | Admitting: Physician Assistant

## 2020-02-18 ENCOUNTER — Ambulatory Visit (INDEPENDENT_AMBULATORY_CARE_PROVIDER_SITE_OTHER): Payer: Medicare Other | Admitting: Physician Assistant

## 2020-02-18 ENCOUNTER — Other Ambulatory Visit: Payer: Self-pay

## 2020-02-18 VITALS — BP 116/56 | HR 66 | Ht 67.0 in | Wt 196.0 lb

## 2020-02-18 DIAGNOSIS — Z952 Presence of prosthetic heart valve: Secondary | ICD-10-CM | POA: Diagnosis not present

## 2020-02-18 DIAGNOSIS — I2510 Atherosclerotic heart disease of native coronary artery without angina pectoris: Secondary | ICD-10-CM

## 2020-02-18 DIAGNOSIS — I5032 Chronic diastolic (congestive) heart failure: Secondary | ICD-10-CM

## 2020-02-18 DIAGNOSIS — Z9861 Coronary angioplasty status: Secondary | ICD-10-CM

## 2020-02-18 DIAGNOSIS — N179 Acute kidney failure, unspecified: Secondary | ICD-10-CM | POA: Diagnosis not present

## 2020-02-18 DIAGNOSIS — I10 Essential (primary) hypertension: Secondary | ICD-10-CM

## 2020-02-18 DIAGNOSIS — M21379 Foot drop, unspecified foot: Secondary | ICD-10-CM

## 2020-02-18 MED ORDER — COLCHICINE 0.6 MG PO TABS
ORAL_TABLET | ORAL | 0 refills | Status: DC
Start: 2020-02-18 — End: 2020-03-10

## 2020-02-18 NOTE — Patient Instructions (Addendum)
Medication Instructions:  1) you have been given a prescription for COLCHICINE 0.6 mg. Take 1.2 mg (2 tablets) when you receive your prescription and then 0.6 mg (1 tablet) 1 hour later. *If you need a refill on your cardiac medications before your next appointment, please call your pharmacy*  Lab Work: TODAY! BMET If you have labs (blood work) drawn today and your tests are completely normal, you will receive your results only by: Marland Kitchen MyChart Message (if you have MyChart) OR . A paper copy in the mail If you have any lab test that is abnormal or we need to change your treatment, we will call you to review the results.   Follow-Up: Please keep your follow-up appointments as scheduled!

## 2020-02-19 LAB — BASIC METABOLIC PANEL
BUN/Creatinine Ratio: 21 (ref 10–24)
BUN: 31 mg/dL — ABNORMAL HIGH (ref 8–27)
CO2: 19 mmol/L — ABNORMAL LOW (ref 20–29)
Calcium: 8.9 mg/dL (ref 8.6–10.2)
Chloride: 96 mmol/L (ref 96–106)
Creatinine, Ser: 1.5 mg/dL — ABNORMAL HIGH (ref 0.76–1.27)
GFR calc Af Amer: 51 mL/min/{1.73_m2} — ABNORMAL LOW (ref >59)
GFR calc non Af Amer: 44 mL/min/{1.73_m2} — ABNORMAL LOW (ref >59)
Glucose: 96 mg/dL (ref 65–99)
Potassium: 4.2 mmol/L (ref 3.5–5.2)
Sodium: 134 mmol/L (ref 134–144)

## 2020-02-23 ENCOUNTER — Telehealth: Payer: Self-pay | Admitting: Physician Assistant

## 2020-02-23 NOTE — Telephone Encounter (Signed)
   Pt's wife calling, she said the last time pt saw Andre Jimenez she said she can help pt sent referral for PT at [facility]. Pt's wife said she called insurance and pt needs referral. She is wondering if Andre Jimenez can do it  Please advise

## 2020-02-23 NOTE — Telephone Encounter (Signed)
I spoke to the patient's wife who said that at one time there was discussion with Nell Range about a referral to Cardiac Rehab and PT for the patient.    I told her that I would forward to her nurse who will further discuss with Joellen Jersey and get back to the patient.  She verbalized understanding.

## 2020-02-23 NOTE — Telephone Encounter (Signed)
Hey Carlette! He is very anxious to start rehab! Can you reach out??   KT

## 2020-02-24 NOTE — Telephone Encounter (Signed)
Paperwork from [facility] signed by Dr. Burt Knack and faxed to number provided.

## 2020-03-09 NOTE — Progress Notes (Signed)
[facility]                                       Cardiology Office Note    Date:  03/10/2020   ID:  Andre Jimenez, DOB 04-23-1941, MRN [id number]  PCP:  Myrlene Broker, MD  Cardiologist:  Dr. Otho Perl (would like to transfer care to Dr. Bettina Gavia) / Dr. Burt Knack & Dr. Roxy Manns (TAVR)  CC: 1 month s/p TAVR  History of Present Illness:  Andre Jimenez is a 79 y.o. 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   The patient reports a longstanding heart murmur since he was a young man. He has been physically active throughout most of his life until this past winter when he describes the onset of exertional angina and shortness of breath.The patient underwent formal cardiac evaluation and an echocardiogram demonstrated findings consistent with severe aortic stenosis with a peak transaortic velocity of 3.8 m/s, mean transvalvular gradient of 33 mmHg, and dimensionless index of 0.24. He then underwent cardiac catheterization confirming the presence of severe aortic stenosis with a mean transvalvular gradient of 38 mmHg and calculated aortic valve area of 0.94 cm. He was found to have severe diffuse proximal LAD stenosis and moderately severe stenosis of the large second obtuse marginal branch of the left circumflex. The patient was seen in surgical consultation by Dr. Roxy Manns. He underwent CTA studies of the heart as well as the chest, abdomen, and pelvis. After review of his studies and further discussion with the patient, recommendations were made for PCI followed by TAVR for treatment of his coronary artery disease and aortic stenosis, respectively.He underwent sucessful PCI of the proximal LAD with implantation of a 3.0 x 38 mm resolute Onyx DES. There was negative RFR analysis of  moderate stenosis in a large second obtuse marginal branch of the circumflex.  He was evaluated by the multidisciplinary valve team and underwent successful TAVR with a 29 mm Medtronic Evolut Pro + 3 THV via the TF approach on 02/09/20. His hospital course was complicated by non-cardiogenic shock shortly after valve deployment, felt to be most likely related to anaphylaxis to either contrast dye or protamine. He was treated with IV solumedrol 125 mg, benadryl 50 mg IV, and an epinephrine gtt. He required inotropic support overnight and developed AKI. He then quickly recovered and creat improved to 1.56. Post operative echo showed EF 55-60% normally functioning TAVR with a mean gradient of 8 mm Hg and no PVL. He was discharged on POD 3 on aspirin and plavix. His foot was bothering him and cardiac rehab felt he had foot drop. A walker was ordered. He has had some issues with gout in follow up and colchicine was Rx.   Today he presents to clinic for follow up. No CP. He does have a sore spot on his chest that hurts with palpation and certain movements. This is getting better. Does get some mild SOB with moderate activity. No LE edema, orthopnea or PND. Had some mild dizziness today after standing from sitting but no syncope. No blood in stool or urine. No palpitations.     Past Medical History:  Diagnosis Date  . Arthritis   .  Bilateral hearing loss   . Carotid artery disease ([place])   . Carpal tunnel syndrome on left   . Coronary artery disease   . Essential hypertension   . GERD (gastroesophageal reflux disease)    WITH ESOPHAGITIS  . Gout   . Mixed hyperlipidemia   . 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 Severe aortic stenosis   . Type II diabetes mellitus (HCC)    Type II  . Vitamin D deficiency     Past Surgical History:  Procedure Laterality Date  . CARPAL  TUNNEL RELEASE Bilateral   . CERVICAL FUSION    . CORONARY STENT INTERVENTION N/A 01/28/2020   Procedure: CORONARY STENT INTERVENTION;  Surgeon: Sherren Mocha, MD;  Location: [facility];  Service: Cardiovascular;  Laterality: N/A;  . ESOPHAGOGASTRODUODENOSCOPY (EGD) WITH ESOPHAGEAL DILATION    . INTRAVASCULAR PRESSURE WIRE/FFR STUDY N/A 01/28/2020   Procedure: INTRAVASCULAR PRESSURE WIRE/FFR STUDY;  Surgeon: Sherren Mocha, MD;  Location: [facility];  Service: Cardiovascular;  Laterality: N/A;  . LUMBAR FUSION    . Rotar cuff repair Right   . TEE WITHOUT CARDIOVERSION N/A 02/09/2020   Procedure: TRANSESOPHAGEAL ECHOCARDIOGRAM (TEE);  Surgeon: Sherren Mocha, MD;  Location: [facility];  Service: Open Heart Surgery;  Laterality: N/A;  . TRANSCATHETER AORTIC VALVE REPLACEMENT, TRANSFEMORAL Left 02/09/2020   Procedure: TRANSCATHETER AORTIC VALVE REPLACEMENT, LEFT TRANSFEMORAL;  Surgeon: Sherren Mocha, MD;  Location: [facility];  Service: Open Heart Surgery;  Laterality: Left;    Current Medications: Outpatient Medications Prior to Visit  Medication Sig Dispense Refill  . amLODipine (NORVASC) 10 MG tablet Take 10 mg by mouth daily.    Marland Kitchen aspirin EC 81 MG tablet Take 81 mg by mouth daily.    Marland Kitchen atorvastatin (LIPITOR) 40 MG tablet Take 1 tablet (40 mg total) by mouth daily. 30 tablet 3  . clopidogrel (PLAVIX) 75 MG tablet Take 1 tablet (75 mg total) by mouth daily. 90 tablet 3  . colchicine 0.6 MG tablet Take 2 tablets by mouth as needed. For gout attacks    . Fluocinolone Acetonide 0.01 % OIL Place 1 drop in ear(s) daily as needed (itching).    Marland Kitchen glipiZIDE (GLUCOTROL XL) 5 MG 24 hr tablet Take 5 mg by mouth in the morning and at bedtime.    . metFORMIN (GLUCOPHAGE) 1000 MG tablet Take 1,000 mg by mouth 2 (two) times daily with a meal.    . pantoprazole (PROTONIX) 40 MG tablet Take 1 tablet (40 mg total) by mouth daily. 90 tablet 3  .  telmisartan-hydrochlorothiazide (MICARDIS HCT) 40-12.5 MG tablet Take 1 tablet by mouth daily.    . colchicine 0.6 MG tablet Take 2 tablets (1.2 mg) when you get your prescription. Take 0.6 mg (1 tablet) 1 hour later. 3 tablet 0   No facility-administered medications prior to visit.     Allergies:   Ivp dye [iodinated diagnostic agents] and Protamine   Social History   Socioeconomic History  . Marital status: Married    Spouse name: Not on file  . Number of children: Not on file  . Years of education: Not on file  . Highest education level: Not on file  Occupational History  . Not on file  Tobacco Use  . Smoking status: Former Smoker    Types: Cigars  . Smokeless tobacco: Former Systems developer  Quit date: 2020  . Tobacco comment: "many years ago"  Vaping Use  . Vaping Use: Never used  Substance and Sexual Activity  . Alcohol use: Not Currently  . Drug use: Not Currently  . Sexual activity: Not on file  Other Topics Concern  . Not on file  Social History Narrative  . Not on file   Social Determinants of Health   Financial Resource Strain:   . Difficulty of Paying Living Expenses:   Food Insecurity:   . Worried About Charity fundraiser in the Last Year:   . Arboriculturist in the Last Year:   Transportation Needs:   . Film/video editor (Medical):   Marland Kitchen Lack of Transportation (Non-Medical):   Physical Activity:   . Days of Exercise per Week:   . Minutes of Exercise per Session:   Stress:   . Feeling of Stress :   Social Connections:   . Frequency of Communication with Friends and Family:   . Frequency of Social Gatherings with Friends and Family:   . Attends Religious Services:   . Active Member of Clubs or Organizations:   . Attends Archivist Meetings:   Marland Kitchen Marital Status:      Family History:  The patient's family history is not on file.     ROS:   Please see the history of present illness.    ROS All other systems reviewed and are  negative.   PHYSICAL EXAM:   VS:  BP 106/62   Pulse 60   Ht 5\' 7"  (1.702 m)   Wt 195 lb (88.5 kg)   SpO2 97%   BMI 30.54 kg/m    GEN: Well nourished, well developed, in no acute distress HEENT: normal Neck: no JVD or masses Cardiac: RRR; soft flow murmur. No rubs, or gallops,no edema  Respiratory:  clear to auscultation bilaterally, normal work of breathing GI: soft, nontender, nondistended, + BS MS: no deformity or atrophy Skin: warm and dry, no rash Neuro:  Alert and Oriented x 3, Strength and sensation are intact Psych: euthymic mood, full affect   Wt Readings from Last 3 Encounters:  03/10/20 195 lb (88.5 kg)  02/18/20 196 lb (88.9 kg)  02/12/20 203 lb 4.2 oz (92.2 kg)      Studies/Labs Reviewed:   EKG:  EKG is NOT ordered today.    Recent Labs: 02/05/2020: B Natriuretic Peptide 146.2 02/09/2020: ALT 12 02/11/2020: Magnesium 1.7 02/12/2020: Hemoglobin 10.9; Platelets 150 02/18/2020: BUN 31; Creatinine, Ser 1.50; Potassium 4.2; Sodium 134   Lipid Panel No results found for: CHOL, TRIG, HDL, CHOLHDL, VLDL, LDLCALC, LDLDIRECT  Additional studies/ records that were reviewed today include:  TAVR OPERATIVE NOTE   Date of Procedure:02/09/2020  Preoperative Diagnosis:Severe Aortic Stenosis   Postoperative Diagnosis:Same   Procedure:   Transcatheter Aortic Valve Replacement - PercutaneousLeftTransfemoral Approach Medtronic CoreValve Evolut Pro (size 55mm, serial # [id number])  Co-Surgeons:Clarence H. Roxy Manns, MD and Sherren Mocha, MD  Anesthesiologist:John Lissa Hoard, MD  Echocardiographer:Mihai Croitoru, MD  Pre-operative Echo Findings: ? Severe aortic stenosis ? Normalleft ventricular systolic function  Post-operative Echo Findings: ? Noparavalvular leak ? Normalleft ventricular systolic function  _____________   Echo 02/10/20:   IMPRESSIONS  1. Left ventricular ejection fraction, by estimation, is 55 to 60%. The  left ventricle has normal function. The left ventricle has no regional  wall motion abnormalities. Left ventricular diastolic parameters are  consistent with Grade I diastolic  dysfunction (impaired relaxation). Elevated left atrial pressure.  2. Right ventricular systolic function is normal. Tricuspid regurgitation  signal is inadequate for assessing PA pressure.  3. Left atrial size was mildly dilated.  4. Mild mitral valve regurgitation.  5. The aortic valve has been repaired/replaced. Aortic valve  regurgitation is not visualized. There is a 29 mm Medtronic  CoreValve-Evolut Pro prosthetic (TAVR) valve present in the aortic  position. Procedure Date: 02/09/2020. Echo findings are consistent  with normal structure and function of the aortic valve prosthesis. Aortic  valve mean gradient measures 8.0 mmHg. Aortic valve Vmax measures 1.86 m/s.  _____________________  Echo 03/10/20 IMPRESSIONS  1. Left ventricular ejection fraction, by estimation, is 60 to 65%. The  left ventricle has normal function. The left ventricle has no regional  wall motion abnormalities. Left ventricular diastolic parameters are  consistent with Grade I diastolic  dysfunction (impaired relaxation).  2. Right ventricular systolic function is normal. The right ventricular  size is normal.  3. The mitral valve is grossly normal. Trivial mitral valve  regurgitation. No evidence of mitral stenosis.  4. The aortic valve has been repaired/replaced. Aortic valve  regurgitation is not visualized.   Aortic Valve: The aortic valve has been repaired/replaced. Aortic valve  regurgitation is not visualized. Aortic valve mean gradient measures 9.4  mmHg. Aortic valve peak gradient measures 16.9 mmHg. There is a Medtronic, stented (TAVR) valve present in  the aortic position.    ASSESSMENT & PLAN:   Severe AS s/p TAVR: Echo  today shows EF 60%, normally functioning TAVR with a mean gradient of 9.4 mmHg and no PVL. He has NYHA class II symptoms. SBE prophylaxis discussed; he gets amoxicillin from the dentsist. Continue on ASA & Plavix. Plavix can be discontinued after 6 months of therapy (08/11/2020)   CKD stage III: creat improved to baseline ~ 1.5 (had AKI in hospital)  CAD: pre TAVR cath showed revealed high-grade focal severe stenosis of the mid left anterior descending coronary artery and moderate stenosis of proximal segment of the large second obtuse marginal branch of the left circumflex coronary artery. There was nonobstructive disease in the right coronary circulation. He is s/p successful PCI/DES to pLAD and negative FFR of moderate stenosis of large OM2 of LCx on 4/29. DAPT for at least 6 months. Will plan to stop plavix in November (6 months out from TAVR)  Chronic diastolic CHF: appears euvomeic. Continue Micardis 40-12.5mg  daily  HTN: Bp a little low today. He did have some mild orthostasis getting out of car. I told him to watch BP. If it remains low and he continue to have positional dizziness, we should decrease his Norvasc from 10--> 5mg  daily.    Medication Adjustments/Labs and Tests Ordered: Current medicines are reviewed at length with the patient today.  Concerns regarding medicines are outlined above.  Medication changes, Labs and Tests ordered today are listed in the Patient Instructions below. Patient Instructions  Medication Instructions:  1) you may STOP PLAVIX 08/11/2020 *If you need a refill on your cardiac medications before your next appointment, please call your pharmacy*  Follow-Up: You are scheduled 06/30/2020 at 8:00AM with Dr. Bettina Gavia.  You will be called to arrange your 1 year TAVR echo and office visit.    Signed, Angelena Form, PA-C  03/10/2020 1:27 PM    [facility] [address], [place], [place]  [address] Phone: [phone]; Fax: [phone]

## 2020-03-10 ENCOUNTER — Ambulatory Visit (HOSPITAL_COMMUNITY): Payer: Medicare Other | Attending: Cardiovascular Disease

## 2020-03-10 ENCOUNTER — Ambulatory Visit (INDEPENDENT_AMBULATORY_CARE_PROVIDER_SITE_OTHER): Payer: Medicare Other | Admitting: Physician Assistant

## 2020-03-10 ENCOUNTER — Other Ambulatory Visit: Payer: Self-pay

## 2020-03-10 ENCOUNTER — Encounter: Payer: Self-pay | Admitting: Physician Assistant

## 2020-03-10 VITALS — BP 106/62 | HR 60 | Ht 67.0 in | Wt 195.0 lb

## 2020-03-10 DIAGNOSIS — Z952 Presence of prosthetic heart valve: Secondary | ICD-10-CM

## 2020-03-10 DIAGNOSIS — N1832 Chronic kidney disease, stage 3b: Secondary | ICD-10-CM | POA: Diagnosis not present

## 2020-03-10 DIAGNOSIS — I10 Essential (primary) hypertension: Secondary | ICD-10-CM

## 2020-03-10 DIAGNOSIS — Z9861 Coronary angioplasty status: Secondary | ICD-10-CM | POA: Diagnosis not present

## 2020-03-10 DIAGNOSIS — I5032 Chronic diastolic (congestive) heart failure: Secondary | ICD-10-CM

## 2020-03-10 DIAGNOSIS — I2510 Atherosclerotic heart disease of native coronary artery without angina pectoris: Secondary | ICD-10-CM | POA: Diagnosis not present

## 2020-03-10 LAB — ECHOCARDIOGRAM COMPLETE
Height: 67 in
Weight: 3120 oz

## 2020-03-10 NOTE — Patient Instructions (Addendum)
Medication Instructions:  1) you may STOP PLAVIX 08/11/2020 *If you need a refill on your cardiac medications before your next appointment, please call your pharmacy*  Follow-Up: You are scheduled 06/30/2020 at 8:00AM with Dr. Bettina Gavia.  You will be called to arrange your 1 year TAVR echo and office visit.

## 2020-06-21 ENCOUNTER — Telehealth: Payer: Self-pay

## 2020-06-21 NOTE — Telephone Encounter (Signed)
   Primary Cardiologist: Dr Bettina Gavia  Chart reviewed as part of pre-operative protocol coverage.  I contacted the patient by phone today.  The preop clearance request listed "extraction" as the procedure.  The patient thinks he was going to have a filling put in. Simple dental extractions are considered low risk procedures per guidelines and generally do not require any specific cardiac clearance. It is also generally accepted that for simple extractions and dental cleanings, there is no need to interrupt blood thinner therapy.  Recommendations per Dr. Antionette Char office were to continue Plavix until August 11, 2020.    The patient has an appointment with Dr. Bettina Gavia 06/30/2020.  Formal dental clearance can be addressed at that visit.  SBE prophylaxis is required for the patient.  I will route this recommendation to the requesting party via Epic fax function and remove from pre-op pool.  Please call with questions.  Kerin Ransom, PA-C 06/21/2020, 3:54 PM

## 2020-06-21 NOTE — Telephone Encounter (Signed)
   [facility] Pre-operative Risk Assessment    HEARTCARE STAFF: - Please ensure there is not already an duplicate clearance open for this procedure. - Under Visit Info/Reason for Call, type in Other and utilize the format Clearance MM/DD/YY or Clearance TBD. Do not use dashes or single digits. - If request is for dental extraction, please clarify the # of teeth to be extracted.  Request for surgical clearance:  1. What type of surgery is being performed? Extraction   2. When is this surgery scheduled? TBD   3. What type of clearance is required (medical clearance vs. Pharmacy clearance to hold med vs. Both)? Both  4. Are there any medications that need to be held prior to surgery and how long?   5. Practice name and name of physician performing surgery? [facility]  6. What is the office phone number? [phone]   7.   What is the office fax number? [phone]  8.   Anesthesia type (None, local, MAC, general) ?    Lowella Grip 06/21/2020, 12:46 PM  _________________________________________________________________   (provider comments below)

## 2020-06-29 DIAGNOSIS — G5602 Carpal tunnel syndrome, left upper limb: Secondary | ICD-10-CM | POA: Insufficient documentation

## 2020-06-29 DIAGNOSIS — M1990 Unspecified osteoarthritis, unspecified site: Secondary | ICD-10-CM | POA: Insufficient documentation

## 2020-06-29 NOTE — Progress Notes (Signed)
Cardiology Office Note:    Date:  06/30/2020   ID:  Andre Jimenez, DOB 1941-03-29, MRN [id number]  PCP:  Myrlene Broker, MD  Cardiologist:  Shirlee More, MD    Referring MD: Myrlene Broker, MD    ASSESSMENT:    1. S/P TAVR (transcatheter aortic valve replacement)   2. CAD S/P percutaneous coronary angioplasty   3. Benign hypertension with CKD (chronic kidney disease) stage III ([place])   4. Mixed hyperlipidemia    PLAN:    In order of problems listed above:  1. Is made a good recovery from TAVR New York Heart Association class I and yearly echocardiograms he will transition from dual antiplatelet aspirin November 11 continue his antihypertensives diabetic treatment lipid-lowering check home blood pressures to assure these at target and review labs from the [place] in 2 weeks. 2. Stable CAD following PCI New York Heart Association class I 3. I am not sure that he has good antihypertensive control, instructed how to check blood pressure at home accurately wife will bring the list in 2 weeks continue ARB thiazide diuretic calcium channel blocker and if additional agent is needed and use low-dose clonidine at bedtime 4. Continue statin await lipid profile he may require addition of either PCSK9 or Zetia as he had severe hyperlipidemia at baseline 5. Severe reaction to contrast dye I told him never received in the future without being prepped and with the input of cardiology   Next appointment: 6 months   Medication Adjustments/Labs and Tests Ordered: Current medicines are reviewed at length with the patient today.  Concerns regarding medicines are outlined above.  No orders of the defined types were placed in this encounter.  No orders of the defined types were placed in this encounter.   Chief Complaint  Patient presents with  . Follow-up    After TAVR and PCI    History of Present Illness:    Andre Jimenez is a 79 y.o. 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  He was last seen 03/10/2020.His  Procedure was complicated by anaphylaxis due to contrast dye. Compliance with diet, lifestyle and medications: Yes  His wife is present participates in evaluation decision making.  Unfortunately just had labs at the [facility] hospital did not bring them with him and she will bring them back to the office when she brings a series of blood pressure determinations at home with her.  Sporadically they check blood pressure he tends to run systolic 1 94-8 46 at home and may need titration of his medications.  Made a full recovery from his TAVR and has no chest pain shortness of breath palpitation or syncope and his immediate post TAVR echocardiogram shows stable valve function.  I reinforced with him he had a severe allergic reaction with anaphylaxis and told the patient and his wife to never receive contrast dye again without being prepped and without interacting with cardiology.  Tends to a statin and his diabetes is well controlled with an A1c of 6.7 last year his PCP 01/06/2020 shows a cholesterol of 259 LDL 178 triglycerides 322 and he had mild renal dysfunction GFR 55 cc creatinine 1.26 potassium 4.0 Past Medical History:  Diagnosis Date  . Acute on chronic diastolic heart failure ([place]) 02/12/2020  . Arthritis   . Bilateral hearing loss   . Carotid  artery disease ([place])   . Carpal tunnel syndrome on left   . Coronary artery disease   . Essential hypertension   . GERD (gastroesophageal reflux disease)    WITH ESOPHAGITIS  . Gout   . Mixed hyperlipidemia   . 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 Severe aortic stenosis   . Type II diabetes mellitus (HCC)    Type II  . Vitamin D  deficiency     Past Surgical History:  Procedure Laterality Date  . CARPAL TUNNEL RELEASE Bilateral   . CERVICAL FUSION    . CORONARY STENT INTERVENTION N/A 01/28/2020   Procedure: CORONARY STENT INTERVENTION;  Surgeon: Sherren Mocha, MD;  Location: [facility];  Service: Cardiovascular;  Laterality: N/A;  . ESOPHAGOGASTRODUODENOSCOPY (EGD) WITH ESOPHAGEAL DILATION    . INTRAVASCULAR PRESSURE WIRE/FFR STUDY N/A 01/28/2020   Procedure: INTRAVASCULAR PRESSURE WIRE/FFR STUDY;  Surgeon: Sherren Mocha, MD;  Location: [facility];  Service: Cardiovascular;  Laterality: N/A;  . LUMBAR FUSION    . Rotar cuff repair Right   . TEE WITHOUT CARDIOVERSION N/A 02/09/2020   Procedure: TRANSESOPHAGEAL ECHOCARDIOGRAM (TEE);  Surgeon: Sherren Mocha, MD;  Location: [facility];  Service: Open Heart Surgery;  Laterality: N/A;  . TRANSCATHETER AORTIC VALVE REPLACEMENT, TRANSFEMORAL Left 02/09/2020   Procedure: TRANSCATHETER AORTIC VALVE REPLACEMENT, LEFT TRANSFEMORAL;  Surgeon: Sherren Mocha, MD;  Location: [facility];  Service: Open Heart Surgery;  Laterality: Left;    Current Medications: Current Meds  Medication Sig  . amLODipine (NORVASC) 10 MG tablet Take 10 mg by mouth daily.  Marland Kitchen aspirin EC 81 MG tablet Take 81 mg by mouth daily.  Marland Kitchen atorvastatin (LIPITOR) 40 MG tablet Take 1 tablet (40 mg total) by mouth daily.  . clopidogrel (PLAVIX) 75 MG tablet Take 1 tablet (75 mg total) by mouth daily.  . colchicine 0.6 MG tablet Take 2 tablets by mouth as needed. For gout attacks  . Fluocinolone Acetonide 0.01 % OIL Place 1 drop in ear(s) daily as needed (itching).  Marland Kitchen glipiZIDE (GLUCOTROL XL) 5 MG 24 hr tablet Take 5 mg by mouth in the morning and at bedtime.  . metFORMIN (GLUCOPHAGE) 1000 MG tablet Take 1,000 mg by mouth 2 (two) times daily with a meal.  . pantoprazole (PROTONIX) 40 MG tablet Take 1 tablet (40 mg total) by mouth daily.  Marland Kitchen telmisartan-hydrochlorothiazide  (MICARDIS HCT) 40-12.5 MG tablet Take 1 tablet by mouth daily.     Allergies:   Ivp dye [iodinated diagnostic agents] and Protamine   Social History   Socioeconomic History  . Marital status: Married    Spouse name: Not on file  . Number of children: Not on file  . Years of education: Not on file  . Highest education level: Not on file  Occupational History  . Not on file  Tobacco Use  . Smoking status: Former Smoker    Types: Cigars  . Smokeless tobacco: Former Systems developer    Quit date: 2020  . Tobacco comment: "many years ago"  Vaping Use  . Vaping Use: Never used  Substance and Sexual Activity  . Alcohol use: Not Currently  . Drug use: Not Currently  . Sexual activity: Not on file  Other Topics Concern  . Not on file  Social History Narrative  . Not on file   Social Determinants of Health  Financial Resource Strain:   . Difficulty of Paying Living Expenses: Not on file  Food Insecurity:   . Worried About Charity fundraiser in the Last Year: Not on file  . Ran Out of Food in the Last Year: Not on file  Transportation Needs:   . Lack of Transportation (Medical): Not on file  . Lack of Transportation (Non-Medical): Not on file  Physical Activity:   . Days of Exercise per Week: Not on file  . Minutes of Exercise per Session: Not on file  Stress:   . Feeling of Stress : Not on file  Social Connections:   . Frequency of Communication with Friends and Family: Not on file  . Frequency of Social Gatherings with Friends and Family: Not on file  . Attends Religious Services: Not on file  . Active Member of Clubs or Organizations: Not on file  . Attends Archivist Meetings: Not on file  . Marital Status: Not on file     Family History: The patient's family history includes Diabetes in his mother; Heart disease in his father; Hypertension in his father. ROS:   Please see the history of present illness.    All other systems reviewed and are  negative.  EKGs/Labs/Other Studies Reviewed:    The following studies were reviewed today:  EKG:  EKG 02/18/2020 [facility] shows sinus rhythm LVH repolarization Post TAVR 03/10/2020 performed by structural heart shows normal left ventricular ejection fraction normal left ventricular size normal diastolic pressures and normal TAVR structure and function mean gradient 9 mmHg Recent Labs: 02/05/2020: B Natriuretic Peptide 146.2 02/09/2020: ALT 12 02/11/2020: Magnesium 1.7 02/12/2020: Hemoglobin 10.9; Platelets 150 02/18/2020: BUN 31; Creatinine, Ser 1.50; Potassium 4.2; Sodium 134  Recent Lipid Panel No results found for: CHOL, TRIG, HDL, CHOLHDL, VLDL, LDLCALC, LDLDIRECT  Physical Exam:    VS:  BP (!) 148/60   Pulse 72   Ht 5\' 7"  (1.702 m)   Wt 203 lb 12.8 oz (92.4 kg)   SpO2 95%   BMI 31.92 kg/m     Wt Readings from Last 3 Encounters:  06/30/20 203 lb 12.8 oz (92.4 kg)  03/10/20 195 lb (88.5 kg)  02/18/20 196 lb (88.9 kg)     GEN: He appears healthy well nourished, well developed in no acute distress HEENT: Normal NECK: No JVD; No carotid bruits LYMPHATICS: No lymphadenopathy CARDIAC: RRR, he has expected 1/6 midsystolic flow murmur in the aortic area S2 normal no AR and no murmurs, rubs, gallops RESPIRATORY:  Clear to auscultation without rales, wheezing or rhonchi  ABDOMEN: Soft, non-tender, non-distended MUSCULOSKELETAL:  No edema; No deformity  SKIN: Warm and dry NEUROLOGIC:  Alert and oriented x 3 PSYCHIATRIC:  Normal affect    Signed, Shirlee More, MD  06/30/2020 8:20 AM    [facility]

## 2020-06-30 ENCOUNTER — Encounter: Payer: Self-pay | Admitting: Cardiology

## 2020-06-30 ENCOUNTER — Ambulatory Visit (INDEPENDENT_AMBULATORY_CARE_PROVIDER_SITE_OTHER): Payer: Medicare Other | Admitting: Cardiology

## 2020-06-30 ENCOUNTER — Other Ambulatory Visit: Payer: Self-pay

## 2020-06-30 VITALS — BP 148/60 | HR 72 | Ht 67.0 in | Wt 203.8 lb

## 2020-06-30 DIAGNOSIS — Z952 Presence of prosthetic heart valve: Secondary | ICD-10-CM

## 2020-06-30 DIAGNOSIS — I2510 Atherosclerotic heart disease of native coronary artery without angina pectoris: Secondary | ICD-10-CM

## 2020-06-30 DIAGNOSIS — I129 Hypertensive chronic kidney disease with stage 1 through stage 4 chronic kidney disease, or unspecified chronic kidney disease: Secondary | ICD-10-CM | POA: Diagnosis not present

## 2020-06-30 DIAGNOSIS — N183 Chronic kidney disease, stage 3 unspecified: Secondary | ICD-10-CM

## 2020-06-30 DIAGNOSIS — E782 Mixed hyperlipidemia: Secondary | ICD-10-CM | POA: Diagnosis not present

## 2020-06-30 DIAGNOSIS — Z9861 Coronary angioplasty status: Secondary | ICD-10-CM

## 2020-06-30 NOTE — Patient Instructions (Addendum)
Medication Instructions:  Your physician recommends that you continue on your current medications as directed. Please refer to the Current Medication list given to you today.  *If you need a refill on your cardiac medications before your next appointment, please call your pharmacy*   Lab Work: None If you have labs (blood work) drawn today and your tests are completely normal, you will receive your results only by: Marland Kitchen MyChart Message (if you have MyChart) OR . A paper copy in the mail If you have any lab test that is abnormal or we need to change your treatment, we will call you to review the results.   Testing/Procedures: None   Follow-Up: At [facility], you and your health needs are our priority.  As part of our continuing mission to provide you with exceptional heart care, we have created designated Provider Care Teams.  These Care Teams include your primary Cardiologist (physician) and Advanced Practice Providers (APPs -  Physician Assistants and Nurse Practitioners) who all work together to provide you with the care you need, when you need it.  We recommend signing up for the patient portal called "MyChart".  Sign up information is provided on this After Visit Summary.  MyChart is used to connect with patients for Virtual Visits (Telemedicine).  Patients are able to view lab/test results, encounter notes, upcoming appointments, etc.  Non-urgent messages can be sent to your provider as well.   To learn more about what you can do with MyChart, go to [email or web address].    Your next appointment:   6 month(s)  The format for your next appointment:   In Person  Provider:   Shirlee More, MD   Other Instructions  Please take your blood pressure at home twice daily and record these. Bring these recordings into our office in two weeks along with your labs from the [place] if possible please! Tips to measure your blood pressure correctly  To determine whether you have  hypertension, a medical professional will take a blood pressure reading. How you prepare for the test, the position of your arm, and other factors can change a blood pressure reading by 10% or more. That could be enough to hide high blood pressure, start you on a drug you don't really need, or lead your doctor to incorrectly adjust your medications. National and international guidelines offer specific instructions for measuring blood pressure. If a doctor, nurse, or medical assistant isn't doing it right, don't hesitate to ask him or her to get with the guidelines. Here's what you can do to ensure a correct reading: . Don't drink a caffeinated beverage or smoke during the 30 minutes before the test. . Sit quietly for five minutes before the test begins. . During the measurement, sit in a chair with your feet on the floor and your arm supported so your elbow is at about heart level. . The inflatable part of the cuff should completely cover at least 80% of your upper arm, and the cuff should be placed on bare skin, not over a shirt. . Don't talk during the measurement. . Have your blood pressure measured twice, with a brief break in between. If the readings are different by 5 points or more, have it done a third time. There are times to break these rules. If you sometimes feel lightheaded when getting out of bed in the morning or when you stand after sitting, you should have your blood pressure checked while seated and then while standing to see if it  falls from one position to the next. Because blood pressure varies throughout the day, your doctor will rarely diagnose hypertension on the basis of a single reading. Instead, he or she will want to confirm the measurements on at least two occasions, usually within a few weeks of one another. The exception to this rule is if you have a blood pressure reading of 180/110 mm Hg or higher. A result this high usually calls for prompt treatment. It's also a good  idea to have your blood pressure measured in both arms at least once, since the reading in one arm (usually the right) may be higher than that in the left. A 2014 study in The American Journal of Medicine of nearly 3,400 people found average arm- to-arm differences in systolic blood pressure of about 5 points. The higher number should be used to make treatment decisions. In 2017, new guidelines from the [place], the [employer] of Cardiology, and nine other health organizations lowered the diagnosis of high blood pressure to 130/80 mm Hg or higher for all adults. The guidelines also redefined the various blood pressure categories to now include normal, elevated, Stage 1 hypertension, Stage 2 hypertension, and hypertensive crisis (see "Blood pressure categories"). Blood pressure categories  Blood pressure category SYSTOLIC (upper number)  DIASTOLIC (lower number)  Normal Less than 120 mm Hg and Less than 80 mm Hg  Elevated 120-129 mm Hg and Less than 80 mm Hg  High blood pressure: Stage 1 hypertension 130-139 mm Hg or 80-89 mm Hg  High blood pressure: Stage 2 hypertension 140 mm Hg or higher or 90 mm Hg or higher  Hypertensive crisis (consult your doctor immediately) Higher than 180 mm Hg and/or Higher than 120 mm Hg  Source: American Heart Association and American Stroke Association. For more on getting your blood pressure under control, buy Controlling Your Blood Pressure, a Special Health Report from [facility].

## 2020-07-05 ENCOUNTER — Telehealth: Payer: Self-pay | Admitting: Cardiology

## 2020-07-05 NOTE — Telephone Encounter (Signed)
[facility] [place] called in and would like to see if office notes from 9/30 could be faxed over to them at [phone]

## 2020-07-05 NOTE — Telephone Encounter (Signed)
Records faxed at this time

## 2020-12-05 ENCOUNTER — Other Ambulatory Visit: Payer: Self-pay | Admitting: Physician Assistant

## 2020-12-05 DIAGNOSIS — Z952 Presence of prosthetic heart valve: Secondary | ICD-10-CM

## 2020-12-21 NOTE — Progress Notes (Unsigned)
Cardiology Office Note:    Date:  12/22/2020   ID:  Andre Jimenez, DOB 02-Apr-1941, MRN [id number]  PCP:  Myrlene Broker, MD  Cardiologist:  Shirlee More, MD    Referring MD: Myrlene Broker, MD    ASSESSMENT:    1. S/P TAVR (transcatheter aortic valve replacement)   2. CAD S/P percutaneous coronary angioplasty   3. Benign hypertension with CKD (chronic kidney disease) stage III ([place])   4. Mixed hyperlipidemia   5. Stage 3b chronic kidney disease ([place])   6. Stenosis of right carotid artery   7. Type 2 diabetes mellitus with chronic kidney disease, without long-term current use of insulin, unspecified CKD stage ([place])    PLAN:    In order of problems listed above:  1. He has had a favorable course post TAVR he now is on aspirin as single antiplatelet therapy no longer is taking clopidogrel recheck echocardiogram June 2022.  He has had no conduction system disease following TAVR. 2. Stable CAD he is off dual antiplatelet therapy no angina on current treatment continue aspirin lipid-lowering calcium channel blocker 3. Stable BP at target current regimen thiazide diuretic calcium channel blocker 4. Lipids at target continue his high intensity statin 5. Stable CKD 6. No bruit asymptomatic probably in 1 year check a duplex 7. Well-controlled diabetes A1c at target   Next appointment: 1 year   Medication Adjustments/Labs and Tests Ordered: Current medicines are reviewed at length with the patient today.  Concerns regarding medicines are outlined above.  No orders of the defined types were placed in this encounter.  No orders of the defined types were placed in this encounter.   Chief Complaint  Patient presents with  . Follow-up    TAVR May 2021  . Coronary Artery Disease  . Hyperlipidemia    History of Present Illness:    Andre Jimenez is a 80 y.o. male with a hx of severe aortic stenosis with TAVR 02/09/2020 CAD with PCI of the LAD 01/28/2020 hypertension  hyperlipidemia type 2 diabetes and 50 to 69% right internal carotid artery stenosis.  He also had anaphylaxis with contrast dye.  He was last seen 06/30/2020.  I reviewed the structural heart note 03/10/2020 with recommendations to stop Plavix 08/11/2020.  His post TAVR echo 03/10/2020 showed normal left ventricular ejection fraction 60% with normally appearing and functioning TAVR mean gradient 9.4 mmHg and no valvular regurgitation  Compliance with diet, lifestyle and medications: Yes  His predominant problem is back pain he has been seen by the surgeons and they require interventional procedures.  He had carries labs [facility] hospital 11/09/2020 creatinine 1.39 stable hemoglobin 13.1 A1c at target 6.6% and nonfasting lipid profile is good LDL 42 cholesterol 113 HDL 44 and triglycerides 236.  From a cardiology perspective he continues to do well.  Following TAVR he has had no chest pain shortness of breath palpitation or syncope. He has mild right ICA stenosis but has had no symptoms of TIA and has no bruit on exam Past Medical History:  Diagnosis Date  . Acute on chronic diastolic heart failure ([place]) 02/12/2020  . Arthritis   . Bilateral hearing loss   . Carotid artery disease ([place])   . Carpal tunnel syndrome on left   . Coronary artery disease   . Essential hypertension   . GERD (gastroesophageal reflux disease)    WITH ESOPHAGITIS  . Gout   . Mixed hyperlipidemia   . 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  29 mm Medtronic Evolut Pro transcatheter heart valve placed via percutaneous left transfemoral approach   . Severe aortic stenosis   . Type II diabetes mellitus (HCC)    Type II  . Vitamin D deficiency     Past Surgical History:  Procedure Laterality Date  . CARPAL TUNNEL RELEASE Bilateral   . CERVICAL FUSION    . CORONARY STENT INTERVENTION N/A 01/28/2020   Procedure: CORONARY STENT INTERVENTION;  Surgeon: Sherren Mocha, MD;  Location: [facility];   Service: Cardiovascular;  Laterality: N/A;  . ESOPHAGOGASTRODUODENOSCOPY (EGD) WITH ESOPHAGEAL DILATION    . INTRAVASCULAR PRESSURE WIRE/FFR STUDY N/A 01/28/2020   Procedure: INTRAVASCULAR PRESSURE WIRE/FFR STUDY;  Surgeon: Sherren Mocha, MD;  Location: [place] [facility];  Service: Cardiovascular;  Laterality: N/A;  . LUMBAR FUSION    . Rotar cuff repair Right   . TEE WITHOUT CARDIOVERSION N/A 02/09/2020   Procedure: TRANSESOPHAGEAL ECHOCARDIOGRAM (TEE);  Surgeon: Sherren Mocha, MD;  Location: [place] [facility];  Service: Open Heart Surgery;  Laterality: N/A;  . TRANSCATHETER AORTIC VALVE REPLACEMENT, TRANSFEMORAL Left 02/09/2020   Procedure: TRANSCATHETER AORTIC VALVE REPLACEMENT, LEFT TRANSFEMORAL;  Surgeon: Sherren Mocha, MD;  Location: [facility];  Service: Open Heart Surgery;  Laterality: Left;    Current Medications: Current Meds  Medication Sig  . acetaminophen (TYLENOL) 500 MG tablet Take 1,000 mg by mouth every 6 (six) hours as needed for moderate pain (Back pain).  Marland Kitchen amLODipine (NORVASC) 10 MG tablet Take 10 mg by mouth daily.  Marland Kitchen aspirin EC 81 MG tablet Take 81 mg by mouth daily.  Marland Kitchen atorvastatin (LIPITOR) 40 MG tablet Take 1 tablet (40 mg total) by mouth daily.  . clopidogrel (PLAVIX) 75 MG tablet Take 1 tablet (75 mg total) by mouth daily.  . colchicine 0.6 MG tablet Take 2 tablets by mouth as needed. For gout attacks  . Fluocinolone Acetonide 0.01 % OIL Place 1 drop in ear(s) daily as needed (itching).  Marland Kitchen glipiZIDE (GLUCOTROL XL) 5 MG 24 hr tablet Take 5 mg by mouth in the morning and at bedtime.  . hydrochlorothiazide (HYDRODIURIL) 25 MG tablet Take 0.5 tablets by mouth daily.  . metFORMIN (GLUCOPHAGE) 1000 MG tablet Take 1,000 mg by mouth 2 (two) times daily with a meal.  . pantoprazole (PROTONIX) 40 MG tablet Take 1 tablet (40 mg total) by mouth daily.  Marland Kitchen telmisartan (MICARDIS) 20 MG tablet Take 2 tablets by mouth daily.     Allergies:   Ivp dye [iodinated  diagnostic agents] and Protamine   Social History   Socioeconomic History  . Marital status: Married    Spouse name: Not on file  . Number of children: Not on file  . Years of education: Not on file  . Highest education level: Not on file  Occupational History  . Not on file  Tobacco Use  . Smoking status: Former Smoker    Types: Cigars  . Smokeless tobacco: Former Systems developer    Quit date: 2020  . Tobacco comment: "many years ago"  Vaping Use  . Vaping Use: Never used  Substance and Sexual Activity  . Alcohol use: Not Currently  . Drug use: Not Currently  . Sexual activity: Not on file  Other Topics Concern  . Not on file  Social History Narrative  . Not on file   Social Determinants of Health   Financial Resource Strain: Not on file  Food Insecurity: Not on file  Transportation Needs: Not on file  Physical Activity: Not on file  Stress: Not on file  Social Connections: Not on file     Family History: The patient's family history includes Diabetes in his mother; Heart disease in his father; Hypertension in his father. ROS:   Please see the history of present illness.    All other systems reviewed and are negative.  EKGs/Labs/Other Studies Reviewed:    The following studies were reviewed today:  EKG today post TAVR 1 year shows sinus rhythm old septal infarction nonspecific T waves Recent Labs: 02/05/2020: B Natriuretic Peptide 146.2 02/09/2020: ALT 12 02/11/2020: Magnesium 1.7 02/12/2020: Hemoglobin 10.9; Platelets 150 02/18/2020: BUN 31; Creatinine, Ser 1.50; Potassium 4.2; Sodium 134  Recent Lipid Panel 11/26/2019: Cholesterol 259 LDL 178 triglycerides 322 HDL 39  Physical Exam:    VS:  BP 130/80 (BP Location: Right Arm, Patient Position: Sitting, Cuff Size: Normal)   Ht 5\' 7"  (1.702 m)   Wt 203 lb (92.1 kg)   SpO2 96%   BMI 31.79 kg/m     Wt Readings from Last 3 Encounters:  12/22/20 203 lb (92.1 kg)  06/30/20 203 lb 12.8 oz (92.4 kg)  03/10/20 195 lb  (88.5 kg)     GEN:  Well nourished, well developed in no acute distress HEENT: Normal NECK: No JVD; No carotid bruits LYMPHATICS: No lymphadenopathy CARDIAC: RRR, no murmurs, rubs, gallops RESPIRATORY:  Clear to auscultation without rales, wheezing or rhonchi  ABDOMEN: Soft, non-tender, non-distended MUSCULOSKELETAL:  No edema; No deformity  SKIN: Warm and dry NEUROLOGIC:  Alert and oriented x 3 PSYCHIATRIC:  Normal affect    Signed, Shirlee More, MD  12/22/2020 8:06 AM    [place]

## 2020-12-22 ENCOUNTER — Other Ambulatory Visit: Payer: Self-pay

## 2020-12-22 ENCOUNTER — Encounter: Payer: Self-pay | Admitting: Cardiology

## 2020-12-22 ENCOUNTER — Ambulatory Visit (INDEPENDENT_AMBULATORY_CARE_PROVIDER_SITE_OTHER): Payer: No Typology Code available for payment source | Admitting: Cardiology

## 2020-12-22 VITALS — BP 130/80 | HR 72 | Ht 67.0 in | Wt 203.0 lb

## 2020-12-22 DIAGNOSIS — N183 Chronic kidney disease, stage 3 unspecified: Secondary | ICD-10-CM

## 2020-12-22 DIAGNOSIS — E782 Mixed hyperlipidemia: Secondary | ICD-10-CM

## 2020-12-22 DIAGNOSIS — I6521 Occlusion and stenosis of right carotid artery: Secondary | ICD-10-CM

## 2020-12-22 DIAGNOSIS — I129 Hypertensive chronic kidney disease with stage 1 through stage 4 chronic kidney disease, or unspecified chronic kidney disease: Secondary | ICD-10-CM

## 2020-12-22 DIAGNOSIS — Z952 Presence of prosthetic heart valve: Secondary | ICD-10-CM

## 2020-12-22 DIAGNOSIS — Z9861 Coronary angioplasty status: Secondary | ICD-10-CM

## 2020-12-22 DIAGNOSIS — E1122 Type 2 diabetes mellitus with diabetic chronic kidney disease: Secondary | ICD-10-CM

## 2020-12-22 DIAGNOSIS — I2510 Atherosclerotic heart disease of native coronary artery without angina pectoris: Secondary | ICD-10-CM

## 2020-12-22 DIAGNOSIS — N1832 Chronic kidney disease, stage 3b: Secondary | ICD-10-CM

## 2020-12-22 NOTE — Patient Instructions (Signed)
Medication Instructions:  Your physician recommends that you continue on your current medications as directed. Please refer to the Current Medication list given to you today.  *If you need a refill on your cardiac medications before your next appointment, please call your pharmacy*   Lab Work: None If you have labs (blood work) drawn today and your tests are completely normal, you will receive your results only by: Marland Kitchen MyChart Message (if you have MyChart) OR . A paper copy in the mail If you have any lab test that is abnormal or we need to change your treatment, we will call you to review the results.   Testing/Procedures: Your physician has requested that you have an echocardiogram in June. Echocardiography is a painless test that uses sound waves to create images of your heart. It provides your doctor with information about the size and shape of your heart and how well your heart's chambers and valves are working. This procedure takes approximately one hour. There are no restrictions for this procedure.     Follow-Up: At [facility], you and your health needs are our priority.  As part of our continuing mission to provide you with exceptional heart care, we have created designated Provider Care Teams.  These Care Teams include your primary Cardiologist (physician) and Advanced Practice Providers (APPs -  Physician Assistants and Nurse Practitioners) who all work together to provide you with the care you need, when you need it.  We recommend signing up for the patient portal called "MyChart".  Sign up information is provided on this After Visit Summary.  MyChart is used to connect with patients for Virtual Visits (Telemedicine).  Patients are able to view lab/test results, encounter notes, upcoming appointments, etc.  Non-urgent messages can be sent to your provider as well.   To learn more about what you can do with MyChart, go to [email or web address].    Your next appointment:   6  month(s)  The format for your next appointment:   In Person  Provider:   Shirlee More, MD   Other Instructions

## 2020-12-23 ENCOUNTER — Telehealth: Payer: Self-pay | Admitting: Cardiology

## 2020-12-23 NOTE — Telephone Encounter (Signed)
[place] called in and stated his "authration" expires 3/29.  They are needing a new one sent over.    Fax number [phone] [place] call back number - [phone]

## 2020-12-23 NOTE — Telephone Encounter (Signed)
Message sent to [facility] to check on a new authorization.

## 2021-02-07 ENCOUNTER — Telehealth: Payer: Self-pay | Admitting: Cardiology

## 2021-02-07 NOTE — Telephone Encounter (Signed)
Pt states she is awaiting authorization from [facility], wife said she called a few weeks back and no one from our office has reached back out to her.

## 2021-02-07 NOTE — Telephone Encounter (Signed)
Message sent to [facility] at this time to please check on the authorization.

## 2021-02-07 NOTE — Progress Notes (Signed)
[facility]                                       Cardiology Office Note    Date:  02/08/2021   ID:  Andre Jimenez, DOB 1941-01-13, MRN [id number]  PCP:  Myrlene Broker, MD  Cardiologist:  Dr. Bettina Gavia / Dr. Burt Knack & Dr. Roxy Manns (TAVR)  CC: 1 year s/p TAVR  History of Present Illness:  Andre Jimenez is a 80 y.o. 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   The patient reports a longstanding heart murmur since he was a young man. He has been physically active throughout most of his life until winter of 2020 when he developed exertional angina and shortness of breath.The patient underwent formal cardiac evaluation and an echocardiogram demonstrated findings consistent with severe aortic stenosis with a peak transaortic velocity of 3.8 m/s, mean transvalvular gradient of 33 mmHg, and dimensionless index of 0.24. He then underwent cardiac catheterization confirming the presence of severe aortic stenosis with a mean transvalvular gradient of 38 mmHg and calculated aortic valve area of 0.94 cm. He was found to have severe diffuse proximal LAD stenosis and moderately severe stenosis of the large second obtuse marginal branch of the left circumflex. The patient was seen in surgical consultation by Dr. Roxy Manns. He underwent CTA studies of the heart as well as the chest, abdomen, and pelvis. After review of his studies and further discussion with the patient, recommendations were made for PCI followed by TAVR for treatment of his coronary artery disease and aortic stenosis, respectively. He underwent sucessful PCI of the proximal LAD with implantation of a 3.0 x 38 mm resolute Onyx DES. There was negative RFR analysis of moderate stenosis in a large second obtuse marginal  branch of the circumflex.   He was evaluated by the multidisciplinary valve team and underwent successful TAVR with a 29 mm Medtronic Evolut Pro + 3 THV via the TF approach on 02/09/20. His hospital course was complicated by non-cardiogenic shock shortly after valve deployment, felt to be most likely related to anaphylaxis to either contrast dye or protamine. He was treated with IV solumedrol 125 mg, benadryl 50 mg IV, and an epinephrine gtt. He required inotropic support overnight and developed AKI with creat peaking at 2.3. He then quickly recovered and creat improved to 1.56. Post operative echo showed EF 55-60% normally functioning TAVR with a mean gradient of 8 mm Hg and no PVL. He was discharged on POD 3 on aspirin and plavix. His foot was bothering him and cardiac rehab felt he had foot drop. A walker was ordered. He has had some issues with gout in follow up and colchicine was Rx'd. 1 month echo showed EF 60%, normally functioning TAVR with a mean gradient of 9.4 mmHg and no PVL.  He had labs [facility] hospital 11/09/2020 which reported creatinine 1.39, hemoglobin 13.1 A1c 6.6% and nonfasting lipid profile is good LDL 42 cholesterol 113 HDL 44 and triglycerides 236.  Today he presents to clinic for follow up. 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 Works out in the garden and goes  to see his grandsons play lacrosse. No chest. Occasionally gets some short of breath walking up hills. No LE edema, orthopnea or PND. No dizziness or syncope. No blood in stool or urine. No palpitations. Occasionally gets some fatigue but doesn't feel "too bad for an old man."    Past Medical History:  Diagnosis Date  . Acute on chronic diastolic heart failure ([place]) 02/12/2020  . Arthritis   . Bilateral hearing loss   . Carotid artery disease ([place])   . Carpal tunnel syndrome on left   . Coronary artery disease   . Essential hypertension   . GERD (gastroesophageal reflux disease)    WITH  ESOPHAGITIS  . Gout   . Mixed hyperlipidemia   . 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 Severe aortic stenosis   . Type II diabetes mellitus (HCC)    Type II  . Vitamin D deficiency     Past Surgical History:  Procedure Laterality Date  . CARPAL TUNNEL RELEASE Bilateral   . CERVICAL FUSION    . CORONARY STENT INTERVENTION N/A 01/28/2020   Procedure: CORONARY STENT INTERVENTION;  Surgeon: Sherren Mocha, MD;  Location: [place] [facility];  Service: Cardiovascular;  Laterality: N/A;  . ESOPHAGOGASTRODUODENOSCOPY (EGD) WITH ESOPHAGEAL DILATION    . INTRAVASCULAR PRESSURE WIRE/FFR STUDY N/A 01/28/2020   Procedure: INTRAVASCULAR PRESSURE WIRE/FFR STUDY;  Surgeon: Sherren Mocha, MD;  Location: [place] [facility];  Service: Cardiovascular;  Laterality: N/A;  . LUMBAR FUSION    . Rotar cuff repair Right   . TEE WITHOUT CARDIOVERSION N/A 02/09/2020   Procedure: TRANSESOPHAGEAL ECHOCARDIOGRAM (TEE);  Surgeon: Sherren Mocha, MD;  Location: [place] [facility];  Service: Open Heart Surgery;  Laterality: N/A;  . TRANSCATHETER AORTIC VALVE REPLACEMENT, TRANSFEMORAL Left 02/09/2020   Procedure: TRANSCATHETER AORTIC VALVE REPLACEMENT, LEFT TRANSFEMORAL;  Surgeon: Sherren Mocha, MD;  Location: [place] [facility];  Service: Open Heart Surgery;  Laterality: Left;    Current Medications: Outpatient Medications Prior to Visit  Medication Sig Dispense Refill  . acetaminophen (TYLENOL) 500 MG tablet Take 1,000 mg by mouth every 6 (six) hours as needed for moderate pain (Back pain).    Marland Kitchen amLODipine (NORVASC) 10 MG tablet Take 10 mg by mouth daily.    Marland Kitchen aspirin EC 81 MG tablet Take 81 mg by mouth daily.    Marland Kitchen atorvastatin (LIPITOR) 40 MG tablet Take 1 tablet (40 mg total) by mouth daily. 30 tablet 3  . colchicine 0.6 MG tablet Take 2 tablets by mouth as needed. For gout  attacks    . Fluocinolone Acetonide 0.01 % OIL Place 1 drop in ear(s) daily as needed (itching).    Marland Kitchen glipiZIDE (GLUCOTROL XL) 5 MG 24 hr tablet Take 5 mg by mouth in the morning and at bedtime.    . hydrochlorothiazide (HYDRODIURIL) 25 MG tablet Take 0.5 tablets by mouth daily.    . metFORMIN (GLUCOPHAGE) 1000 MG tablet Take 1,000 mg by mouth 2 (two) times daily with a meal.    . pantoprazole (PROTONIX) 40 MG tablet Take 1 tablet (40 mg total) by mouth daily. 90 tablet 3  . telmisartan (MICARDIS) 20 MG tablet Take 2 tablets by mouth daily.     No facility-administered medications prior to visit.     Allergies:   Ivp dye [iodinated diagnostic agents] and Protamine   Social History   Socioeconomic History  .  Marital status: Married    Spouse name: Not on file  . Number of children: Not on file  . Years of education: Not on file  . Highest education level: Not on file  Occupational History  . Not on file  Tobacco Use  . Smoking status: Former Smoker    Types: Cigars  . Smokeless tobacco: Former Systems developer    Quit date: 2020  . Tobacco comment: "many years ago"  Vaping Use  . Vaping Use: Never used  Substance and Sexual Activity  . Alcohol use: Not Currently  . Drug use: Not Currently  . Sexual activity: Not on file  Other Topics Concern  . Not on file  Social History Narrative  . Not on file   Social Determinants of Health   Financial Resource Strain: Not on file  Food Insecurity: Not on file  Transportation Needs: Not on file  Physical Activity: Not on file  Stress: Not on file  Social Connections: Not on file     Family History:  The patient's family history includes Diabetes in his mother; Heart disease in his father; Hypertension in his father.     ROS:   Please see the history of present illness.    ROS All other systems reviewed and are negative.   PHYSICAL EXAM:   VS:  BP 140/70 (BP Location: Left Arm, Patient Position: Sitting, Cuff Size: Normal)   Pulse 68    Ht 5\' 7"  (1.702 m)   Wt 201 lb (91.2 kg)   SpO2 95%   BMI 31.48 kg/m    GEN: Well nourished, well developed, in no acute distress HEENT: normal Neck: no JVD or masses Cardiac: RRR; soft flow murmur. No rubs, or gallops,no edema  Respiratory:  clear to auscultation bilaterally, normal work of breathing GI: soft, nontender, nondistended, + BS MS: no deformity or atrophy Skin: warm and dry, no rash Neuro:  Alert and Oriented x 3, Strength and sensation are intact Psych: euthymic mood, full affect   Wt Readings from Last 3 Encounters:  02/08/21 201 lb (91.2 kg)  12/22/20 203 lb (92.1 kg)  06/30/20 203 lb 12.8 oz (92.4 kg)      Studies/Labs Reviewed:   EKG:  EKG is NOT ordered today.    Recent Labs: 02/09/2020: ALT 12 02/11/2020: Magnesium 1.7 02/12/2020: Hemoglobin 10.9; Platelets 150 02/18/2020: BUN 31; Creatinine, Ser 1.50; Potassium 4.2; Sodium 134   Lipid Panel No results found for: CHOL, TRIG, HDL, CHOLHDL, VLDL, LDLCALC, LDLDIRECT  Additional studies/ records that were reviewed today include:  TAVR OPERATIVE NOTE   Date of Procedure:02/09/2020  Preoperative Diagnosis:Severe Aortic Stenosis   Postoperative Diagnosis:Same   Procedure:   Transcatheter Aortic Valve Replacement - PercutaneousLeftTransfemoral Approach Medtronic CoreValve Evolut Pro (size 72mm, serial # [id number])  Co-Surgeons:Clarence H. Roxy Manns, MD and Sherren Mocha, MD  Anesthesiologist:John Lissa Hoard, MD  Echocardiographer:Mihai Croitoru, MD  Pre-operative Echo Findings: ? Severe aortic stenosis ? Normalleft ventricular systolic function  Post-operative Echo Findings: ? Noparavalvular leak ? Normalleft ventricular systolic function  _____________   Echo 02/10/20:  IMPRESSIONS  1. Left ventricular ejection fraction, by estimation, is 55 to 60%. The  left  ventricle has normal function. The left ventricle has no regional  wall motion abnormalities. Left ventricular diastolic parameters are  consistent with Grade I diastolic  dysfunction (impaired relaxation). Elevated left atrial pressure.  2. Right ventricular systolic function is normal. Tricuspid regurgitation  signal is inadequate for assessing PA pressure.  3. Left atrial size was mildly dilated.  4. Mild mitral valve regurgitation.  5. The aortic valve has been repaired/replaced. Aortic valve  regurgitation is not visualized. There is a 29 mm Medtronic  CoreValve-Evolut Pro prosthetic (TAVR) valve present in the aortic  position. Procedure Date: 02/09/2020. Echo findings are consistent  with normal structure and function of the aortic valve prosthesis. Aortic  valve mean gradient measures 8.0 mmHg. Aortic valve Vmax measures 1.86 m/s.  _____________________  Echo 03/10/20 IMPRESSIONS  1. Left ventricular ejection fraction, by estimation, is 60 to 65%. The  left ventricle has normal function. The left ventricle has no regional  wall motion abnormalities. Left ventricular diastolic parameters are  consistent with Grade I diastolic  dysfunction (impaired relaxation).  2. Right ventricular systolic function is normal. The right ventricular  size is normal.  3. The mitral valve is grossly normal. Trivial mitral valve  regurgitation. No evidence of mitral stenosis.  4. The aortic valve has been repaired/replaced. Aortic valve  regurgitation is not visualized.   Aortic Valve: The aortic valve has been repaired/replaced. Aortic valve  regurgitation is not visualized. Aortic valve mean gradient measures 9.4  mmHg. Aortic valve peak gradient measures 16.9 mmHg. There is a Medtronic, stented (TAVR) valve present in  the aortic position.   ____________________  Echo 02/08/21 IMPRESSIONS    1. Left ventricular ejection fraction, by estimation, is 50 to 55%. The  left ventricle  has low normal function. The left ventricle demonstrates  global hypokinesis. Left ventricular diastolic parameters are consistent  with Grade I diastolic dysfunction  (impaired relaxation). Elevated left atrial pressure.  2. Right ventricular systolic function is normal. The right ventricular  size is normal.  3. The mitral valve is normal in structure. Mild mitral valve  regurgitation. No evidence of mitral stenosis.  4. The aortic valve has been repaired/replaced. Aortic valve  regurgitation is not visualized. No aortic stenosis is present. There is a  29 mm Medtronic Evolut Pro transcatheter valve present in the aortic  position. Procedure Date: 02/09/2020. Echo  findings are consistent with normal structure and function of the aortic  valve prosthesis. Aortic valve mean gradient measures 10.0 mmHg. Aortic  valve Vmax measures 2.09 m/s.  5. The inferior vena cava is normal in size with greater than 50%  respiratory variability, suggesting right atrial pressure of 3 mmHg.   Comparison(s): The left ventricular function is worsened  ASSESSMENT & PLAN:   Severe AS s/p TAVR: echo today shows EF 50-55%, normally functioning TAVR with a mean gradient of 10 mm hg and no PVL. He has NYHA class I symptoms. SBE prophylaxis discussed; he gets amoxicillin from the dentsist. Continue on aspirin 81 mg daily alone.   CKD stage III: labs stable in feb at the [place]: creat 1.39  CAD: pre TAVR cath showed revealed high-grade focal severe stenosis of the mid left anterior descending coronary artery and moderate stenosis of proximal segment of the large second obtuse marginal branch of the left circumflex coronary artery. There was nonobstructive disease in the right coronary circulation. He is s/p successful PCI/DES to pLAD and negative FFR of moderate stenosis of large OM2 of LCx on 01/28/20. No chest pain. Continue aspirin and statin.  Chronic diastolic CHF: appears euvomeic. Continue HTCZ  HTN: Bp  borderline today but pt reports while coat HTN. Continue telmisartan, norvasc and hctz   Medication Adjustments/Labs and Tests Ordered: Current medicines are reviewed at length with the patient today.  Concerns regarding medicines are outlined above.  Medication changes, Labs and Tests  ordered today are listed in the Patient Instructions below. Patient Instructions  Medication Instructions:  Your provider recommends that you continue on your current medications as directed. Please refer to the Current Medication list given to you today.   *If you need a refill on your cardiac medications before your next appointment, please call your pharmacy*   Follow-Up: You are scheduled with Dr. Bettina Gavia on 06/15/21 at 4:20PM.    Signed, Angelena Form, PA-C  02/08/2021 3:28 PM    [address], [place], [place]  [address] Phone: [phone]; Fax: [phone]

## 2021-02-08 ENCOUNTER — Ambulatory Visit (HOSPITAL_COMMUNITY): Payer: No Typology Code available for payment source | Attending: Cardiology

## 2021-02-08 ENCOUNTER — Encounter: Payer: Self-pay | Admitting: Physician Assistant

## 2021-02-08 ENCOUNTER — Ambulatory Visit (INDEPENDENT_AMBULATORY_CARE_PROVIDER_SITE_OTHER): Payer: No Typology Code available for payment source | Admitting: Physician Assistant

## 2021-02-08 ENCOUNTER — Other Ambulatory Visit: Payer: Self-pay

## 2021-02-08 VITALS — BP 140/70 | HR 68 | Ht 67.0 in | Wt 201.0 lb

## 2021-02-08 DIAGNOSIS — N1832 Chronic kidney disease, stage 3b: Secondary | ICD-10-CM

## 2021-02-08 DIAGNOSIS — Z952 Presence of prosthetic heart valve: Secondary | ICD-10-CM

## 2021-02-08 DIAGNOSIS — I5032 Chronic diastolic (congestive) heart failure: Secondary | ICD-10-CM

## 2021-02-08 DIAGNOSIS — I2510 Atherosclerotic heart disease of native coronary artery without angina pectoris: Secondary | ICD-10-CM

## 2021-02-08 DIAGNOSIS — I10 Essential (primary) hypertension: Secondary | ICD-10-CM

## 2021-02-08 DIAGNOSIS — Z9861 Coronary angioplasty status: Secondary | ICD-10-CM

## 2021-02-08 LAB — ECHOCARDIOGRAM COMPLETE
AR max vel: 1.05 cm2
AV Area VTI: 1.2 cm2
AV Area mean vel: 0.81 cm2
AV Mean grad: 10 mmHg
AV Peak grad: 17.5 mmHg
Ao pk vel: 2.09 m/s
Area-P 1/2: 3.27 cm2
S' Lateral: 4.6 cm

## 2021-02-08 NOTE — Patient Instructions (Addendum)
Medication Instructions:  Your provider recommends that you continue on your current medications as directed. Please refer to the Current Medication list given to you today.   *If you need a refill on your cardiac medications before your next appointment, please call your pharmacy*   Follow-Up: You are scheduled with Dr. Bettina Gavia on 06/15/21 at 4:20PM.

## 2021-03-16 ENCOUNTER — Telehealth: Payer: Self-pay | Admitting: Pharmacist

## 2021-03-16 NOTE — Telephone Encounter (Signed)
Patient wife called asking if now that pt is no longer on clopidogrel if he can take omeprazole again. Advised that since he no longer needs clopidogrel and is only on ASA (per Joellen [place] Thompson's last note) it is ok to take omeprazole again.

## 2021-06-15 ENCOUNTER — Other Ambulatory Visit: Payer: Self-pay

## 2021-06-15 ENCOUNTER — Encounter: Payer: Self-pay | Admitting: Cardiology

## 2021-06-15 ENCOUNTER — Ambulatory Visit (INDEPENDENT_AMBULATORY_CARE_PROVIDER_SITE_OTHER): Payer: No Typology Code available for payment source | Admitting: Cardiology

## 2021-06-15 VITALS — BP 140/74 | HR 70 | Ht 67.0 in | Wt 206.8 lb

## 2021-06-15 DIAGNOSIS — N1832 Chronic kidney disease, stage 3b: Secondary | ICD-10-CM

## 2021-06-15 DIAGNOSIS — Z9861 Coronary angioplasty status: Secondary | ICD-10-CM

## 2021-06-15 DIAGNOSIS — I129 Hypertensive chronic kidney disease with stage 1 through stage 4 chronic kidney disease, or unspecified chronic kidney disease: Secondary | ICD-10-CM

## 2021-06-15 DIAGNOSIS — I2510 Atherosclerotic heart disease of native coronary artery without angina pectoris: Secondary | ICD-10-CM

## 2021-06-15 DIAGNOSIS — Z952 Presence of prosthetic heart valve: Secondary | ICD-10-CM | POA: Diagnosis not present

## 2021-06-15 DIAGNOSIS — I5032 Chronic diastolic (congestive) heart failure: Secondary | ICD-10-CM

## 2021-06-15 DIAGNOSIS — N183 Chronic kidney disease, stage 3 unspecified: Secondary | ICD-10-CM

## 2021-06-15 NOTE — Progress Notes (Signed)
Cardiology Office Note:    Date:  06/15/2021   ID:  Andre Jimenez, DOB 07-02-1941, MRN [id number]  PCP:  Myrlene Broker, MD  Cardiologist:  Shirlee More, MD    Referring MD: Myrlene Broker, MD    ASSESSMENT:    1. S/P TAVR (transcatheter aortic valve replacement)   2. CAD S/P percutaneous coronary angioplasty   3. Benign hypertension with CKD (chronic kidney disease) stage III ([place])   4. Chronic diastolic CHF (congestive heart failure) (HCC)   5. Stage 3b chronic kidney disease ([facility])    PLAN:    In order of problems listed above:  Ezechiel is doing well following TAVR and normal valve function on echocardiogram performed in May He has stable CAD having no angina on current treatment we will continue aspirin calcium channel blocker and high intensity statin BP at target continue current treatment including ARB stable CKD Compensated not on loop diuretic   Next appointment: 1 year   Medication Adjustments/Labs and Tests Ordered: Current medicines are reviewed at length with the patient today.  Concerns regarding medicines are outlined above.  No orders of the defined types were placed in this encounter.  No orders of the defined types were placed in this encounter.   Chief complaint follow-up for his heart disease including TAVR and CAD   History of Present Illness:    Andre Jimenez is a 80 y.o. male with a hx of severe aortic stenosis with TAVR 02/09/2020 CAD with PCI and stent left anterior descending coronary artery 01/28/2020 hypertension hyperlipidemia type 2 diabetes and mild to moderate right internal carotid artery stenosis 50 to 69%.  He was last seen 12/22/2020.  Compliance with diet, lifestyle and medications: Yes  I reviewed his echo results with him Is having no angina dyspnea edema palpitation or syncope. He will be seeing GI in the [facility] hospital he has a history of Barrett's esophagus. To be seeing dermatology at the [facility] hospital he has skin  cancers. He is seeing spine surgery and from his description radiofrequency ablation to manage cervical pain.  Echocardiogram performed on the structural heart team 02/08/2021 showed normal TAVR function ejection fraction low normal 50 to 55% with elevated left atrial pressure normal right right ventricular size and function.  1. Left ventricular ejection fraction, by estimation, is 50 to 55%. The  left ventricle has low normal function. The left ventricle demonstrates  global hypokinesis. Left ventricular diastolic parameters are consistent  with Grade I diastolic dysfunction  (impaired relaxation). Elevated left atrial pressure.   2. Right ventricular systolic function is normal. The right ventricular  size is normal.   3. The mitral valve is normal in structure. Mild mitral valve  regurgitation. No evidence of mitral stenosis.   4. The aortic valve has been repaired/replaced. Aortic valve  regurgitation is not visualized. No aortic stenosis is present. There is a  29 mm Medtronic Evolut Pro transcatheter valve present in the aortic  position. Procedure Date: 02/09/2020. Echo  findings are consistent with normal structure and function of the aortic  valve prosthesis. Aortic valve mean gradient measures 10.0 mmHg. Aortic  valve Vmax measures 2.09 m/s.   5. The inferior vena cava is normal in size with greater than 50%  respiratory variability, suggesting right atrial pressure of 3 mmHg.  Past Medical History:  Diagnosis Date   Acute on chronic diastolic heart failure ([place]) 02/12/2020   Arthritis    Bilateral hearing loss    Carotid artery disease (  [place])    Carpal tunnel syndrome on left    Coronary artery disease    Essential hypertension    GERD (gastroesophageal reflux disease)    WITH ESOPHAGITIS   Gout    Mixed hyperlipidemia    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   29 mm Medtronic Evolut Pro transcatheter heart valve placed via percutaneous left  transfemoral approach    Severe aortic stenosis    Type II diabetes mellitus ([place])    Type II   Vitamin D deficiency     Past Surgical History:  Procedure Laterality Date   CARPAL TUNNEL RELEASE Bilateral    CERVICAL FUSION     CORONARY STENT INTERVENTION N/A 01/28/2020   Procedure: CORONARY STENT INTERVENTION;  Surgeon: Sherren Mocha, MD;  Location: [place] [facility];  Service: Cardiovascular;  Laterality: N/A;   ESOPHAGOGASTRODUODENOSCOPY (EGD) WITH ESOPHAGEAL DILATION     INTRAVASCULAR PRESSURE WIRE/FFR STUDY N/A 01/28/2020   Procedure: INTRAVASCULAR PRESSURE WIRE/FFR STUDY;  Surgeon: Sherren Mocha, MD;  Location: [place] [facility];  Service: Cardiovascular;  Laterality: N/A;   LUMBAR FUSION     Rotar cuff repair Right    TEE WITHOUT CARDIOVERSION N/A 02/09/2020   Procedure: TRANSESOPHAGEAL ECHOCARDIOGRAM (TEE);  Surgeon: Sherren Mocha, MD;  Location: [place] [facility];  Service: Open Heart Surgery;  Laterality: N/A;   TRANSCATHETER AORTIC VALVE REPLACEMENT, TRANSFEMORAL Left 02/09/2020   Procedure: TRANSCATHETER AORTIC VALVE REPLACEMENT, LEFT TRANSFEMORAL;  Surgeon: Sherren Mocha, MD;  Location: [place] [facility];  Service: Open Heart Surgery;  Laterality: Left;    Current Medications: Current Meds  Medication Sig   acetaminophen (TYLENOL) 500 MG tablet Take 1,000 mg by mouth every 6 (six) hours as needed for moderate pain (Back pain).   amLODipine (NORVASC) 10 MG tablet Take 10 mg by mouth daily.   aspirin EC 81 MG tablet Take 81 mg by mouth daily.   atorvastatin (LIPITOR) 40 MG tablet Take 1 tablet (40 mg total) by mouth daily.   colchicine 0.6 MG tablet Take 2 tablets by mouth as needed. For gout attacks   Fluocinolone Acetonide 0.01 % OIL Place 1 drop in ear(s) daily as needed (itching).   glipiZIDE (GLUCOTROL XL) 5 MG 24 hr tablet Take 5 mg by mouth in the morning and at bedtime.   hydrochlorothiazide (HYDRODIURIL) 25 MG tablet Take 0.5 tablets by mouth daily.    metFORMIN (GLUCOPHAGE) 1000 MG tablet Take 1,000 mg by mouth 2 (two) times daily with a meal.   pantoprazole (PROTONIX) 40 MG tablet Take 1 tablet (40 mg total) by mouth daily.   telmisartan (MICARDIS) 20 MG tablet Take 2 tablets by mouth daily.     Allergies:   Ivp dye [iodinated diagnostic agents] and Protamine   Social History   Socioeconomic History   Marital status: Married    Spouse name: Not on file   Number of children: Not on file   Years of education: Not on file   Highest education level: Not on file  Occupational History   Not on file  Tobacco Use   Smoking status: Former    Types: Cigars   Smokeless tobacco: Former    Quit date: 2020   Tobacco comments:    "many years ago"  Scientific laboratory technician Use: Never used  Substance and Sexual Activity   Alcohol use: Not Currently   Drug use: Not Currently   Sexual activity: Not on file  Other Topics Concern   Not on  file  Social History Narrative   Not on file   Social Determinants of Health   Financial Resource Strain: Not on file  Food Insecurity: Not on file  Transportation Needs: Not on file  Physical Activity: Not on file  Stress: Not on file  Social Connections: Not on file     Family History: The patient's family history includes Diabetes in his mother; Heart disease in his father; Hypertension in his father. ROS:   Please see the history of present illness.    All other systems reviewed and are negative.  EKGs/Labs/Other Studies Reviewed:    The following studies were reviewed today:    Recent Labs: The hospital in 0878 2022 creatinine 1.4 Sodium 137 potassium 4.1 GFR 47 cc cholesterol 148 triglycerides 303 HDL 46 LDL 41  Physical Exam:    VS:  BP 140/74 (BP Location: Left Arm, Patient Position: Sitting, Cuff Size: Normal)   Pulse 70   Ht '5\' 7"'$  (1.702 m)   Wt 206 lb 12.8 oz (93.8 kg)   SpO2 96%   BMI 32.39 kg/m     Wt Readings from Last 3 Encounters:  06/15/21 206 lb 12.8 oz (93.8 kg)   02/08/21 201 lb (91.2 kg)  12/22/20 203 lb (92.1 kg)     GEN:  Well nourished, well developed in no acute distress HEENT: Normal NECK: No JVD; No carotid bruits LYMPHATICS: No lymphadenopathy CARDIAC: 1/6 systolic ejection murmur RRR, no murmurs, rubs, gallops RESPIRATORY:  Clear to auscultation without rales, wheezing or rhonchi  ABDOMEN: Soft, non-tender, non-distended MUSCULOSKELETAL:  No edema; No deformity  SKIN: Warm and dry NEUROLOGIC:  Alert and oriented x 3 PSYCHIATRIC:  Normal affect    Signed, Shirlee More, MD  06/15/2021 2:32 PM    [place]

## 2021-06-15 NOTE — Patient Instructions (Signed)

## 2021-08-28 ENCOUNTER — Telehealth: Payer: Self-pay | Admitting: Cardiology

## 2021-08-28 NOTE — Telephone Encounter (Signed)
Spoke with Dr. Burt Knack in regards to this matter.  MD advises that if pt is adequately pre medicated neck injection should be safe.  Ultimately it is up to Provider performing procedure to decide if pt is a candidate for injection.  I called and reported this information to pt wife ok per DPR.  Wife thanked me for follow up call.

## 2021-08-28 NOTE — Telephone Encounter (Signed)
Spoke to patient wife per dpr. She reports that patient is scheduled for a upcoming surgery Wednesday when contrast dye will be used. Patient had a severe reaction  to contrast dye in May 2021 when he underwent his valve surgery with Dr. Burt Knack. There are notes about this. Patient wife states the patient was near death due to the reaction. The surgery office is saying if he is prophylactically treated with prednisone the patient should be fine. The wife is hesitant to believe this as she remembers Dr. Burt Knack saying the patient should never get contrast dye again. She wants to confirm with Dr. Munley/Dr.Cooper on this matter.

## 2021-08-28 NOTE — Telephone Encounter (Signed)
Please see patient call from today 08/28/21.  This matter was reviewed with Dr. Burt Knack and addressed previously.

## 2021-08-28 NOTE — Telephone Encounter (Signed)
Via Andre Jimenez (Patients Wife) Please Advise:    My husband had a severe allergic  (near death) reaction,  to the contrast dye last year when he had his heart valve replaced.  He has an appointment with Dr. Maia Petties at [facility] for an injection to relieve some neck pain.  They have said they will need to use the dye and that pre-treating him with Prednisone and Benadryl should be sufficient to prevent this reaction.  I did not agree.   I just remembered that Dr. Burt Knack said he should never take the dye again.   Please call me [phone].  Andre Jimenez

## 2021-11-28 IMAGING — CT CT ABD-PELV W/O CM
2 of 5 series · 15 of 46 positions shown, 17 images · non-contrast
Comparison: 01/22/2020

CLINICAL DATA: Hypotension, shock status post TAVR, evaluate for
retroperitoneal hematoma

EXAM:
CT ABDOMEN AND PELVIS WITHOUT CONTRAST
TECHNIQUE: Multidetector CT imaging of the abdomen and pelvis was performed
following the standard protocol without IV contrast.

[Series 3: a/p w/o 5mm · axial · non-contrast · 0.82mm/px · z∈[+828,+1278]mm · 12 of 108 slices shown, 14 images]
[im 9/108  soft-tissue]
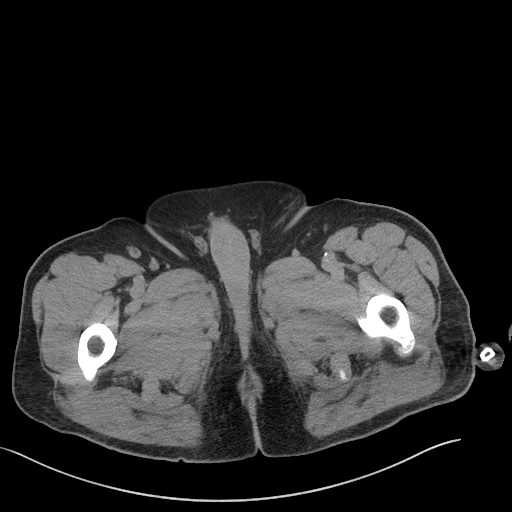
[im 9/108  bone]
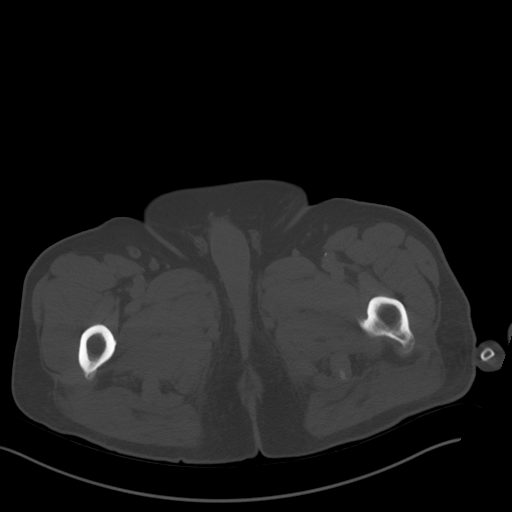
[im 17/108  soft-tissue]
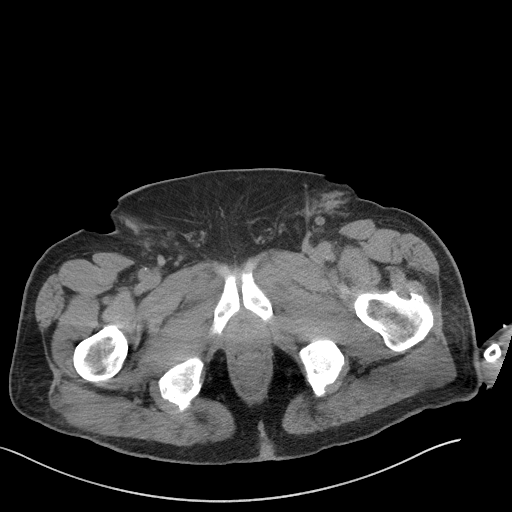
[im 25/108  soft-tissue]
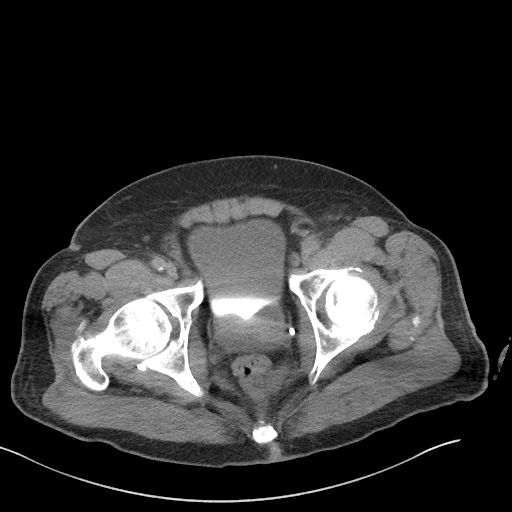
[im 33/108  soft-tissue]
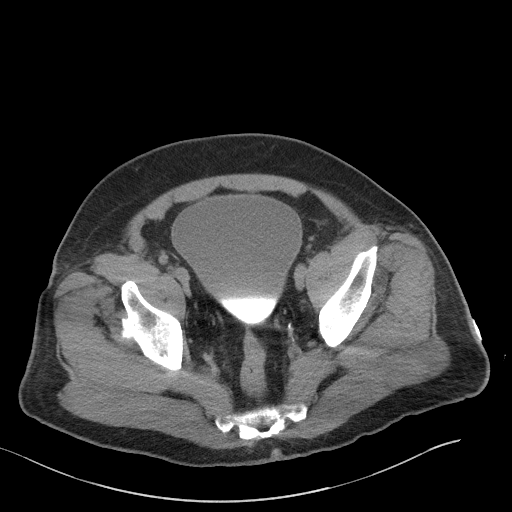
[im 42/108  soft-tissue]
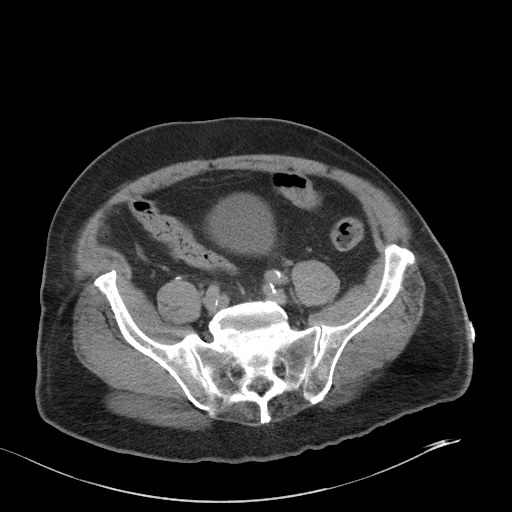
[im 50/108  soft-tissue]
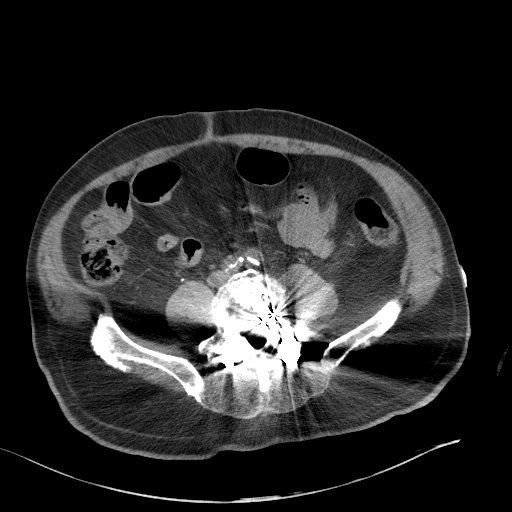
[im 58/108  soft-tissue]
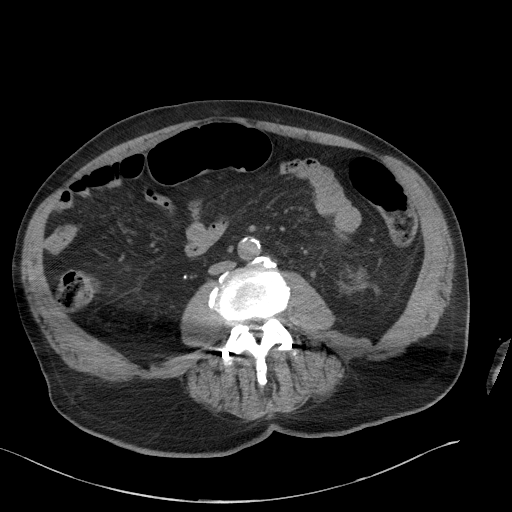
[im 66/108  soft-tissue]
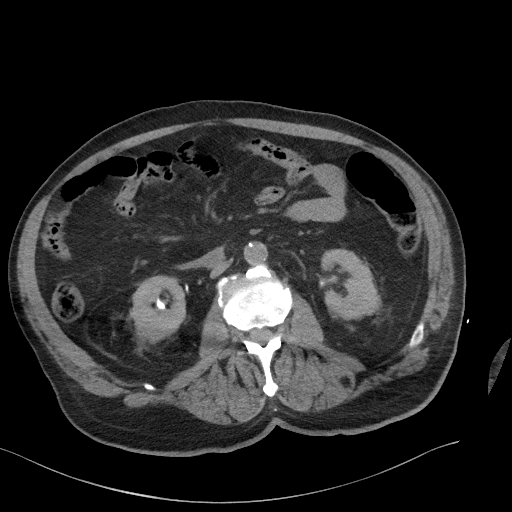
[im 75/108  soft-tissue]
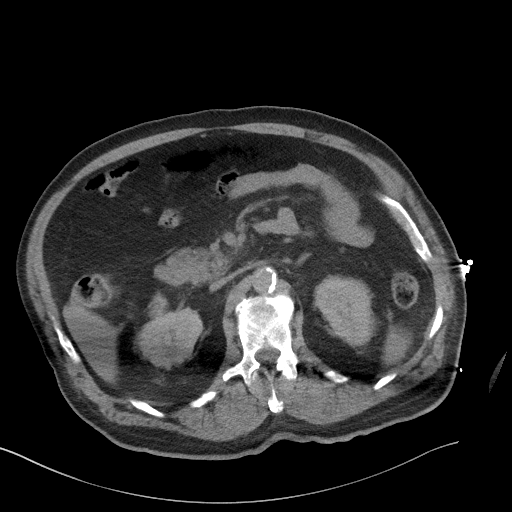
[im 75/108  bone]
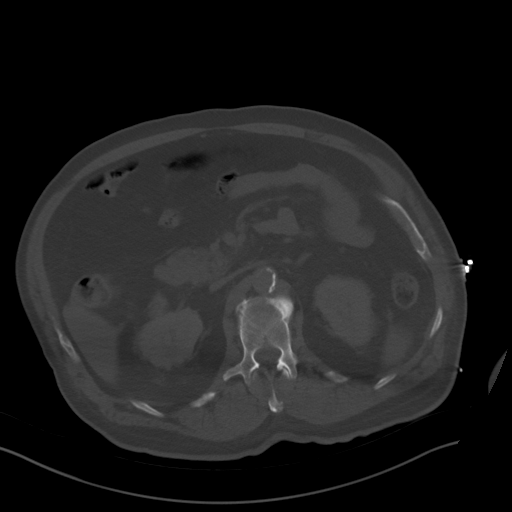
[im 83/108  soft-tissue]
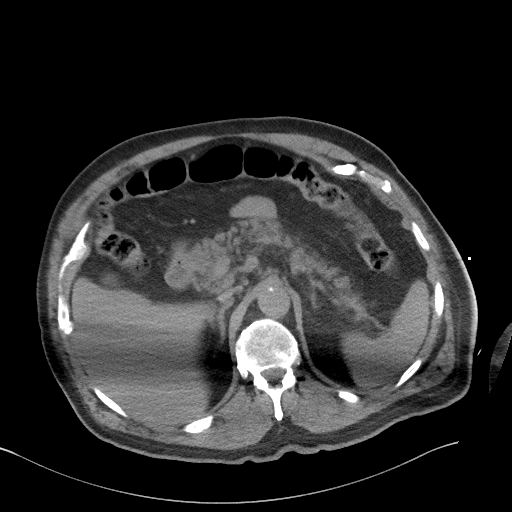
[im 91/108  soft-tissue]
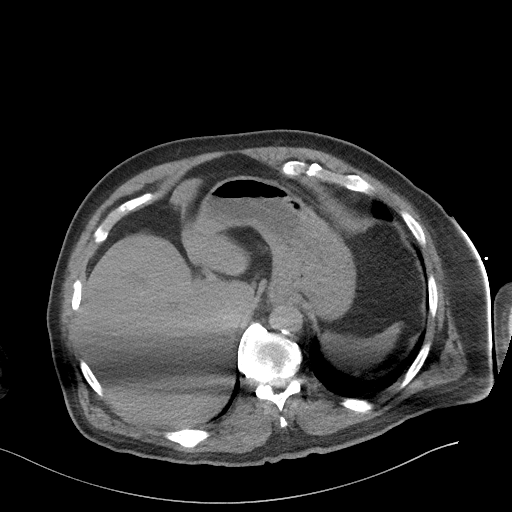
[im 99/108  soft-tissue]
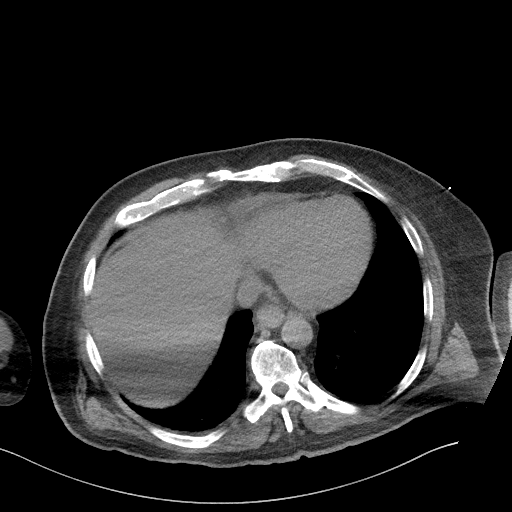

[Series 7: a/p w/o cor · coronal · non-contrast · 1.02mm/px · 3 of 163 slices shown]
[im 55/163  soft-tissue]
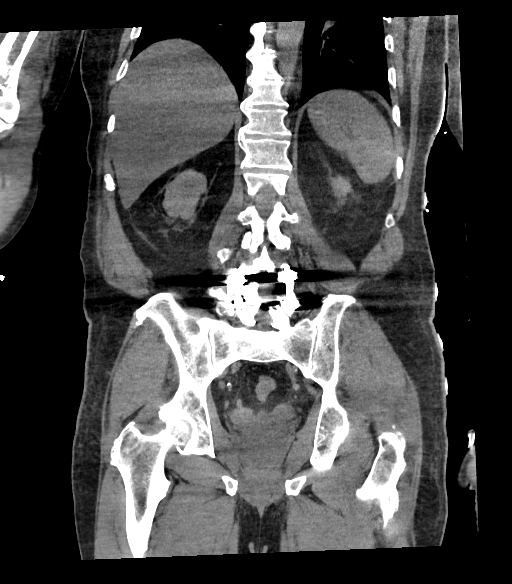
[im 73/163  soft-tissue]
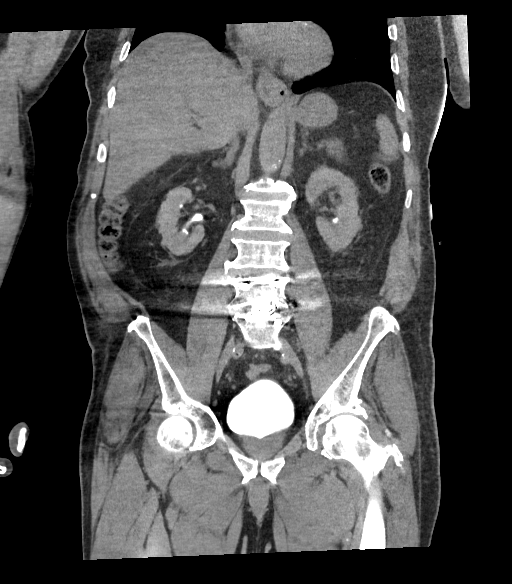
[im 91/163  soft-tissue]
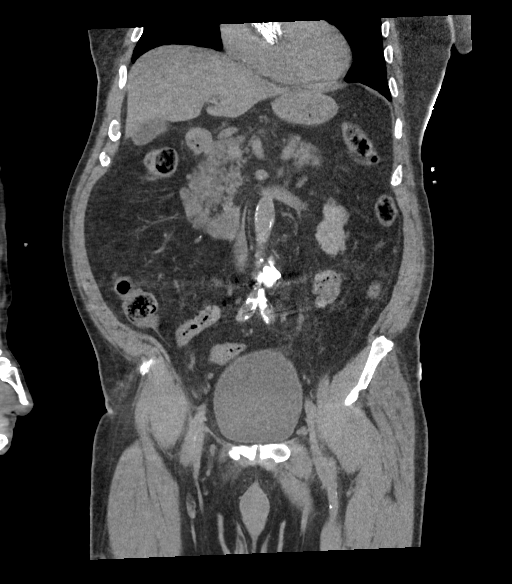

[15 of 46 positions shown; findings below may reference images not displayed]

FINDINGS: Lower chest: Aortic valve prosthesis partially visualized. No acute
pleural or parenchymal lung disease.

Hepatobiliary: High density material seen within the gallbladder
consistent with small stones or sludge. No cholecystitis. The liver
is unremarkable.

Pancreas: Unremarkable. No pancreatic ductal dilatation or
surrounding inflammatory changes.

Spleen: Normal in size without focal abnormality.

Adrenals/Urinary Tract: Excreted contrast within the kidneys from
recent catheterization. No obstructive uropathy or urinary tract
calculi. The adrenals are unremarkable. The bladder is moderately
distended with no focal abnormality.

Stomach/Bowel: No bowel obstruction or ileus. No bowel wall
thickening or inflammatory changes.

Vascular/Lymphatic: Aortic atherosclerosis. No enlarged abdominal or
pelvic lymph nodes.

Reproductive: Prostate is unremarkable.

Other: There is no free fluid or free intraperitoneal gas. No
evidence of retroperitoneal hemorrhage. Postsurgical changes are
seen in the inguinal regions from recent catheterization.

Musculoskeletal: No acute or destructive bony lesions. Postsurgical
changes are seen from prior L4/L5 discectomy and fusion.
IMPRESSION: 1. No evidence of retroperitoneal hemorrhage.
2. High density material within the gallbladder consistent with
small stones or sludge. No cholecystitis.
3. Aortic Atherosclerosis ([id number]).

## 2022-02-08 ENCOUNTER — Telehealth: Payer: Self-pay | Admitting: Cardiology

## 2022-02-08 DIAGNOSIS — I509 Heart failure, unspecified: Secondary | ICD-10-CM | POA: Diagnosis not present

## 2022-02-08 NOTE — Telephone Encounter (Signed)
Called wife and informed her that Dr. Bettina Gavia was out of the office this week but one of his partners was rounding in the hospitals. Patient had no further questions. ?

## 2022-02-08 NOTE — Telephone Encounter (Signed)
Wife is calling to say that he patient is in the Jimenez and she should like for Andre Jimenez to take a look. ?

## 2022-02-15 ENCOUNTER — Telehealth (HOSPITAL_COMMUNITY): Payer: Self-pay | Admitting: *Deleted

## 2022-02-15 NOTE — Telephone Encounter (Signed)
Pts wife called to report pts bp 105/50 before meds and he feels weak. Pt was recently discharged from the hospital and started carvedilol 3.'125mg'$  bid and lasix '40mg'$  daily. Pt has a New pt appointment with Dr.McLean on Monday. Dr.Mclean has never seen pt. Pt currently followed by Dr.Munley. Wife is holding carvedilol and lasix until she hears from Dr.Munley's office  Message routed to [facility]

## 2022-02-15 NOTE — Telephone Encounter (Signed)
I asked Dr.McLean for advice since I had  not heard back from Dr.Munley's office. Per Dr.McLean hold carvedilol and lasix today tomorrow restart carvedilol and decrease lasix to '20mg'$  daily. Pts wife aware.

## 2022-02-19 ENCOUNTER — Telehealth (HOSPITAL_COMMUNITY): Payer: Self-pay | Admitting: Surgery

## 2022-02-19 ENCOUNTER — Ambulatory Visit (HOSPITAL_COMMUNITY)
Admission: RE | Admit: 2022-02-19 | Discharge: 2022-02-19 | Disposition: A | Discharge status: Home / Self Care | Payer: Medicare Other | Source: Ambulatory Visit | Attending: Cardiology | Admitting: Cardiology

## 2022-02-19 ENCOUNTER — Other Ambulatory Visit (HOSPITAL_COMMUNITY): Payer: Self-pay

## 2022-02-19 VITALS — BP 131/70 | HR 58 | Wt 184.0 lb

## 2022-02-19 DIAGNOSIS — I447 Left bundle-branch block, unspecified: Secondary | ICD-10-CM | POA: Diagnosis not present

## 2022-02-19 DIAGNOSIS — E1122 Type 2 diabetes mellitus with diabetic chronic kidney disease: Secondary | ICD-10-CM | POA: Diagnosis not present

## 2022-02-19 DIAGNOSIS — I5032 Chronic diastolic (congestive) heart failure: Secondary | ICD-10-CM

## 2022-02-19 DIAGNOSIS — Z955 Presence of coronary angioplasty implant and graft: Secondary | ICD-10-CM | POA: Insufficient documentation

## 2022-02-19 DIAGNOSIS — Z952 Presence of prosthetic heart valve: Secondary | ICD-10-CM | POA: Insufficient documentation

## 2022-02-19 DIAGNOSIS — I130 Hypertensive heart and chronic kidney disease with heart failure and stage 1 through stage 4 chronic kidney disease, or unspecified chronic kidney disease: Secondary | ICD-10-CM | POA: Insufficient documentation

## 2022-02-19 DIAGNOSIS — Z79899 Other long term (current) drug therapy: Secondary | ICD-10-CM | POA: Insufficient documentation

## 2022-02-19 DIAGNOSIS — E782 Mixed hyperlipidemia: Secondary | ICD-10-CM

## 2022-02-19 DIAGNOSIS — N183 Chronic kidney disease, stage 3 unspecified: Secondary | ICD-10-CM | POA: Insufficient documentation

## 2022-02-19 DIAGNOSIS — I2510 Atherosclerotic heart disease of native coronary artery without angina pectoris: Secondary | ICD-10-CM | POA: Diagnosis not present

## 2022-02-19 DIAGNOSIS — R0789 Other chest pain: Secondary | ICD-10-CM | POA: Insufficient documentation

## 2022-02-19 DIAGNOSIS — Z9861 Coronary angioplasty status: Secondary | ICD-10-CM

## 2022-02-19 DIAGNOSIS — I5022 Chronic systolic (congestive) heart failure: Secondary | ICD-10-CM | POA: Diagnosis not present

## 2022-02-19 LAB — BRAIN NATRIURETIC PEPTIDE: B Natriuretic Peptide: 423.5 pg/mL — ABNORMAL HIGH (ref 0.0–100.0)

## 2022-02-19 LAB — MAGNESIUM: Magnesium: 1.6 mg/dL — ABNORMAL LOW (ref 1.7–2.4)

## 2022-02-19 LAB — BASIC METABOLIC PANEL
Anion gap: 10 (ref 5–15)
BUN: 27 mg/dL — ABNORMAL HIGH (ref 8–23)
CO2: 24 mmol/L (ref 22–32)
Calcium: 9.1 mg/dL (ref 8.9–10.3)
Chloride: 105 mmol/L (ref 98–111)
Creatinine, Ser: 1.24 mg/dL (ref 0.61–1.24)
GFR, Estimated: 59 mL/min — ABNORMAL LOW (ref >60)
Glucose, Bld: 90 mg/dL (ref 70–99)
Potassium: 4.3 mmol/L (ref 3.5–5.1)
Sodium: 139 mmol/L (ref 135–145)

## 2022-02-19 LAB — CBC
HCT: 43.1 % (ref 39.0–52.0)
Hemoglobin: 14.1 g/dL (ref 13.0–17.0)
MCH: 29.5 pg (ref 26.0–34.0)
MCHC: 32.7 g/dL (ref 30.0–36.0)
MCV: 90.2 fL (ref 80.0–100.0)
Platelets: 270 10*3/uL (ref 150–400)
RBC: 4.78 MIL/uL (ref 4.22–5.81)
RDW: 13.6 % (ref 11.5–15.5)
WBC: 10.4 10*3/uL (ref 4.0–10.5)
nRBC: 0 % (ref 0.0–0.2)

## 2022-02-19 LAB — LIPID PANEL
Cholesterol: 139 mg/dL (ref 0–200)
HDL: 41 mg/dL (ref >40)
LDL Cholesterol: 71 mg/dL (ref 0–99)
Total CHOL/HDL Ratio: 3.4 RATIO
Triglycerides: 137 mg/dL (ref <150)
VLDL: 27 mg/dL (ref 0–40)

## 2022-02-19 MED ORDER — PREDNISONE 50 MG PO TABS: ORAL_TABLET | ORAL | 0 refills | Status: DC

## 2022-02-19 MED ORDER — DIPHENHYDRAMINE HCL 50 MG PO TABS: ORAL_TABLET | ORAL | 0 refills | Status: DC

## 2022-02-19 MED ORDER — ENTRESTO 24-26 MG PO TABS
1.0000 | ORAL_TABLET | Freq: Two times a day (BID) | ORAL | 11 refills | Status: DC
Start: 2022-02-19 — End: 2022-02-19

## 2022-02-19 MED ORDER — ENTRESTO 24-26 MG PO TABS
1.0000 | ORAL_TABLET | Freq: Two times a day (BID) | ORAL | 11 refills | Status: DC
Start: 2022-02-19 — End: 2022-04-25

## 2022-02-19 NOTE — Patient Instructions (Signed)
Medication Changes:  Start Entresto 24/26 Twice daily   Lab Work:  Labs done today, your results will be available in MyChart, we will contact you for abnormal readings.   Testing/Procedures:  You are scheduled for a Cardiac Catheterization on Thursday, June 1 with Dr. Loralie Champagne.  1. Please arrive at the Main Entrance A at [facility]: [place],  [address] at 8:30 AM (This time is two hours before your procedure to ensure your preparation). Free valet parking service is available.   Special note: Every effort is made to have your procedure done on time. Please understand that emergencies sometimes delay scheduled procedures.  2. Diet: Do not eat solid foods after midnight.  You may have clear liquids until 5 AM upon the day of the procedure.  3. Labs: You will need to have blood drawn on the morning of procedure.  4. Medication instructions in preparation for your procedure:   Contrast Allergy: Yes, Please take Prednisone '50mg'$  by mouth at: Thirteen hours prior to cath 5:00pm on Wednesday Seven hours prior to cath 11:00pm on Wednesday And prior to leaving home please take last dose of Prednisone '50mg'$  and Benadryl '50mg'$  by mouth. 7am morning of the procedure Thursday June 1st.   Stop taking, Lasix (Furosemide)  Thursday, June 1,  Do not take Diabetes Med Glucophage (Metformin) on the day of the procedure and HOLD 48 HOURS AFTER THE PROCEDURE.  Do not Take Glipizide morning of procedure Thursday June 1st  On the morning of your procedure, take Aspirin and any morning medicines NOT listed above.  You may use sips of water.  5. Plan to go home the same day, you will only stay overnight if medically necessary. 6. You MUST have a responsible adult to drive you home. 7. An adult MUST be with you the first 24 hours after you arrive home. 8. Bring a current list of your medications, and the last time and date medication taken. 9. Bring ID and current  insurance cards. 10.Please wear clothes that are easy to get on and off and wear slip-on shoes.  Thank you for allowing [place] to care for you!   -- [place] [facility]   Referrals:  none  Special Instructions // Education:  None   Follow-Up in: as scheduled   At the [facility], you and your health needs are our priority. We have a designated team specialized in the treatment of Heart Failure. This Care Team includes your primary Heart Failure Specialized Cardiologist (physician), Advanced Practice Providers (APPs- Physician Assistants and Nurse Practitioners), and Pharmacist who all work together to provide you with the care you need, when you need it.   You may see any of the following providers on your designated Care Team at your next follow up:  Dr Glori Bickers Dr Haynes Kerns, NP Lyda Jester, [place] [facility] [place], [place] Audry Riles, PharmD   Please be sure to bring in all your medications bottles to every appointment.   Need to Contact us:  If you have any questions or concerns before your next appointment please send [place] a message through [place] or call our office at [phone].    TO LEAVE A MESSAGE FOR THE NURSE SELECT OPTION 2, PLEASE LEAVE A MESSAGE INCLUDING: YOUR NAME DATE OF BIRTH CALL BACK NUMBER REASON FOR CALL**this is important as we prioritize the call backs  YOU WILL RECEIVE A CALL BACK THE SAME DAY AS LONG  AS YOU CALL BEFORE 4:00 PM

## 2022-02-19 NOTE — Telephone Encounter (Signed)
-----   Message from Larey Dresser, MD sent at 02/19/2022  3:58 PM EDT ----- Start magnesium oxide 400 mg daily with low Mg

## 2022-02-19 NOTE — Telephone Encounter (Signed)
Per patients wife he was already taking Mag Ox 400 mg daily, Potassium 20 meq daily, Isosorbide 30 mg daily prior to todays AHF Clinic appt and after recent hospitalization. I did not see any of those medications on his medlist nor to be stopped today. Message forwarded to Dr. Aundra Dubin to advise what for patient to continue or change.  I let her know that we would call back with new instructions.

## 2022-02-19 NOTE — Progress Notes (Signed)
PCP: Myrlene Broker, MD Cardiology: Dr. Bettina Gavia HF Cardiology: Dr. Aundra Dubin  81 y.o. with history of CAD, aortic stenosis s/p TAVR, and systolic CHF was self-referred to CHF clinic for evaluation of cardiomyopathy.  Patient was found to have severe AS in 2021.  Pre-TAVR evaluation showed severe proximal LAD stenosis treated by DES in 4/21.  He had TAVR in 5/21 with 29 mm Medtronic Evolut THV.  Echo in 5/22 showed EF 50-55%, normal TAVR valve.   He developed gradually worsening exertional dyspnea over the last 2 months.  He especially noted dyspnea walking up a hill or incline.  He has also had on and off "soreness" in his chest.  This is not clearly exertional.  He developed orthopnea.  He was admitted in 5/23 at [facility] with CHF.  He was incidentally found to have COVID-19.  Echo showed EF down to 30-35% with normal TAVR valve function.  He was diuresed and had a right thoracentesis.  He felt much better by the time of discharge.  Currently, he is still short of breath walking up a steep hill but overall less symptomatic.  He is no longer orthopneic.  Still with episodes of chest pain, not clearly exertional.  No PND.  No lightheadedness.  He has a LBBB that is new compared to last prior in 3/22.   ECG (personally reviewed): NSR, LBBB 138 msec   Labs (5/21): K 4.2, creatinine 1.5  PMH:  1. Barrett's esophagus 2. Chronic back pain 3. HTN 4. Type 2 diabetes 5. Contrast allergy 6. CKD stage 3 7. Aortic stenosis: Severe AS, now s/p TAVR in 5/21 with 29 mm Medtronic Evolut Pro.  8. CAD: 4/21 PCI to proximal LAD (pre-TAVR).  9. Carotid stenosis: Carotid dopplers (5/21) with 50-69% RICA stenosis.  10. COVID-19 5/23 11. Chronic systolic CHF: Cause uncertain.   - Echo (5/22): EF 50-55%, normal RV, normal bioprosthetic AoV s/p TAVR mean gradient 10 mmHg - Echo (5/23): EF 30-35%, normal RV, normal bioprosthetic AoV s/p TAVR mean gradient 7 mmHg 12. LBBB  SH: Lives in [place], nonsmoker,  no ETOH. Married.   Family History  Problem Relation Age of Onset   Diabetes Mother    Heart disease Father    Hypertension Father    ROS: All systems reviewed and negative except as per HPI.   Current Outpatient Medications  Medication Sig Dispense Refill   acetaminophen (TYLENOL) 500 MG tablet Take 1,000 mg by mouth every 6 (six) hours as needed for moderate pain (Back pain).     aspirin EC 81 MG tablet Take 81 mg by mouth daily.     atorvastatin (LIPITOR) 40 MG tablet Take 1 tablet (40 mg total) by mouth daily. 30 tablet 3   carvedilol (COREG) 3.125 MG tablet Take 1 tablet (3.125 mg total) by mouth 2 (two) times daily. 180 tablet 3   colchicine 0.6 MG tablet Take 2 tablets by mouth as needed. For gout attacks     diphenhydrAMINE (BENADRYL) 50 MG tablet As directed for your Heart Catheterization. Take Morning of Procedure prior to leaving the house 2 tablet 0   Fluocinolone Acetonide 0.01 % OIL Place 1 drop in ear(s) daily as needed (itching).     furosemide (LASIX) 40 MG tablet Take 0.5 tablets (20 mg total) by mouth daily. 90 tablet 3   glipiZIDE (GLUCOTROL XL) 5 MG 24 hr tablet Take 5 mg by mouth in the morning and at bedtime.     metFORMIN (GLUCOPHAGE) 1000 MG tablet  Take 1,000 mg by mouth 2 (two) times daily with a meal.     pantoprazole (PROTONIX) 40 MG tablet Take 1 tablet (40 mg total) by mouth daily. 90 tablet 3   predniSONE (DELTASONE) 50 MG tablet As directed for your Heart  Catheterization. 5 tablet 0   sacubitril-valsartan (ENTRESTO) 24-26 MG Take 1 tablet by mouth 2 (two) times daily. 60 tablet 11   No current facility-administered medications for this encounter.   BP 131/70   Pulse (!) 58   Wt 83.5 kg (184 lb)   SpO2 98%   BMI 28.82 kg/m  General: NAD Neck: JVP 8 cm, no thyromegaly or thyroid nodule.  Lungs: Clear to auscultation bilaterally with normal respiratory effort. CV: Nondisplaced PMI.  Heart regular S1/S2, no S3/S4, 2/6 early SEM RUSB.  No peripheral  edema.  No carotid bruit.  Normal pedal pulses.  Abdomen: Soft, nontender, no hepatosplenomegaly, no distention.  Skin: Intact without lesions or rashes.  Neurologic: Alert and oriented x 3.  Psych: Normal affect. Extremities: No clubbing or cyanosis.  HEENT: Normal.   Assessment/Plan: 1. Chronic systolic CHF: Echo in 3/33 with EF 30-35%, normally-functioning bioprosthetic AoV and normal RV.  Most recent prior echo from 5/22 showed EF 50-55%.  Fall in EF was associated with CHF exacerbation and atypical chest pain.  Patient has history of CAD s/p prior LAD stent, fall in EF could certainly be due to progressive CAD.  He also, of note, has LBBB that is new since 3/22.  On exam, he seems mildly volume overloaded.  NYHA class II symptoms, improved after recent CHF hospitalization.  - Continue Coreg 3.125 mg bid.  - Add Entresto 24/26 bid, BMET/BNP today and BMET in 10 days.  - Continue Lasix 20 mg daily.  Mild volume overload, Entresto should add some extra diuresis.  - LHC/RHC to assess filling pressures and assess for progressive CAD.  I discussed risks/benefits with patient and he agrees to procedure (see below).  - LBBB does not appear markedly wide, he may be CRT candidate in future but will need to follow EF with medical management.  2. CAD: PCI to proximal LAD pre-TAVR in 4/21, not associated with ACS.  He has had atypical chest pain and EF has dropped recently along with the development of CHF.   - Continue ASA 81 - Continue atorvastatin 40 daily, check lipids today.  - As above, think he warrants coronary angiography to assess for progressive coronary disease with chest pain and fall in EF.  He has a contrast allergy so will have to be premedicated.  Procedure may be more difficult due to prior TAVR with Medtronic valve. We discussed risks/benefits and he agreed to procedure.  3. CKD: Stage 3.  BMET today.  4. Aortic stenosis: S/p TAVR with Medtronic Evolut valve, valve was functioning  normally on 5/23 echo.   Followup in 3 wks.   Loralie Champagne 02/19/2022

## 2022-02-19 NOTE — H&P (View-Only) (Signed)
PCP: Myrlene Broker, MD Cardiology: Dr. Bettina Gavia HF Cardiology: Dr. Aundra Dubin  81 y.o. with history of CAD, aortic stenosis s/p TAVR, and systolic CHF was self-referred to CHF clinic for evaluation of cardiomyopathy.  Patient was found to have severe AS in 2021.  Pre-TAVR evaluation showed severe proximal LAD stenosis treated by DES in 4/21.  He had TAVR in 5/21 with 29 mm Medtronic Evolut THV.  Echo in 5/22 showed EF 50-55%, normal TAVR valve.   He developed gradually worsening exertional dyspnea over the last 2 months.  He especially noted dyspnea walking up a hill or incline.  He has also had on and off "soreness" in his chest.  This is not clearly exertional.  He developed orthopnea.  He was admitted in 5/23 at [facility] with CHF.  He was incidentally found to have COVID-19.  Echo showed EF down to 30-35% with normal TAVR valve function.  He was diuresed and had a right thoracentesis.  He felt much better by the time of discharge.  Currently, he is still short of breath walking up a steep hill but overall less symptomatic.  He is no longer orthopneic.  Still with episodes of chest pain, not clearly exertional.  No PND.  No lightheadedness.  He has a LBBB that is new compared to last prior in 3/22.   ECG (personally reviewed): NSR, LBBB 138 msec   Labs (5/21): K 4.2, creatinine 1.5  PMH:  1. Barrett's esophagus 2. Chronic back pain 3. HTN 4. Type 2 diabetes 5. Contrast allergy 6. CKD stage 3 7. Aortic stenosis: Severe AS, now s/p TAVR in 5/21 with 29 mm Medtronic Evolut Pro.  8. CAD: 4/21 PCI to proximal LAD (pre-TAVR).  9. Carotid stenosis: Carotid dopplers (5/21) with 50-69% RICA stenosis.  10. COVID-19 5/23 11. Chronic systolic CHF: Cause uncertain.   - Echo (5/22): EF 50-55%, normal RV, normal bioprosthetic AoV s/p TAVR mean gradient 10 mmHg - Echo (5/23): EF 30-35%, normal RV, normal bioprosthetic AoV s/p TAVR mean gradient 7 mmHg 12. LBBB  SH: Lives in [place], nonsmoker,  no ETOH. Married.   Family History  Problem Relation Age of Onset   Diabetes Mother    Heart disease Father    Hypertension Father    ROS: All systems reviewed and negative except as per HPI.   Current Outpatient Medications  Medication Sig Dispense Refill   acetaminophen (TYLENOL) 500 MG tablet Take 1,000 mg by mouth every 6 (six) hours as needed for moderate pain (Back pain).     aspirin EC 81 MG tablet Take 81 mg by mouth daily.     atorvastatin (LIPITOR) 40 MG tablet Take 1 tablet (40 mg total) by mouth daily. 30 tablet 3   carvedilol (COREG) 3.125 MG tablet Take 1 tablet (3.125 mg total) by mouth 2 (two) times daily. 180 tablet 3   colchicine 0.6 MG tablet Take 2 tablets by mouth as needed. For gout attacks     diphenhydrAMINE (BENADRYL) 50 MG tablet As directed for your Heart Catheterization. Take Morning of Procedure prior to leaving the house 2 tablet 0   Fluocinolone Acetonide 0.01 % OIL Place 1 drop in ear(s) daily as needed (itching).     furosemide (LASIX) 40 MG tablet Take 0.5 tablets (20 mg total) by mouth daily. 90 tablet 3   glipiZIDE (GLUCOTROL XL) 5 MG 24 hr tablet Take 5 mg by mouth in the morning and at bedtime.     metFORMIN (GLUCOPHAGE) 1000 MG tablet  Take 1,000 mg by mouth 2 (two) times daily with a meal.     pantoprazole (PROTONIX) 40 MG tablet Take 1 tablet (40 mg total) by mouth daily. 90 tablet 3   predniSONE (DELTASONE) 50 MG tablet As directed for your Heart  Catheterization. 5 tablet 0   sacubitril-valsartan (ENTRESTO) 24-26 MG Take 1 tablet by mouth 2 (two) times daily. 60 tablet 11   No current facility-administered medications for this encounter.   BP 131/70   Pulse (!) 58   Wt 83.5 kg (184 lb)   SpO2 98%   BMI 28.82 kg/m  General: NAD Neck: JVP 8 cm, no thyromegaly or thyroid nodule.  Lungs: Clear to auscultation bilaterally with normal respiratory effort. CV: Nondisplaced PMI.  Heart regular S1/S2, no S3/S4, 2/6 early SEM RUSB.  No peripheral  edema.  No carotid bruit.  Normal pedal pulses.  Abdomen: Soft, nontender, no hepatosplenomegaly, no distention.  Skin: Intact without lesions or rashes.  Neurologic: Alert and oriented x 3.  Psych: Normal affect. Extremities: No clubbing or cyanosis.  HEENT: Normal.   Assessment/Plan: 1. Chronic systolic CHF: Echo in 2/20 with EF 30-35%, normally-functioning bioprosthetic AoV and normal RV.  Most recent prior echo from 5/22 showed EF 50-55%.  Fall in EF was associated with CHF exacerbation and atypical chest pain.  Patient has history of CAD s/p prior LAD stent, fall in EF could certainly be due to progressive CAD.  He also, of note, has LBBB that is new since 3/22.  On exam, he seems mildly volume overloaded.  NYHA class II symptoms, improved after recent CHF hospitalization.  - Continue Coreg 3.125 mg bid.  - Add Entresto 24/26 bid, BMET/BNP today and BMET in 10 days.  - Continue Lasix 20 mg daily.  Mild volume overload, Entresto should add some extra diuresis.  - LHC/RHC to assess filling pressures and assess for progressive CAD.  I discussed risks/benefits with patient and he agrees to procedure (see below).  - LBBB does not appear markedly wide, he may be CRT candidate in future but will need to follow EF with medical management.  2. CAD: PCI to proximal LAD pre-TAVR in 4/21, not associated with ACS.  He has had atypical chest pain and EF has dropped recently along with the development of CHF.   - Continue ASA 81 - Continue atorvastatin 40 daily, check lipids today.  - As above, think he warrants coronary angiography to assess for progressive coronary disease with chest pain and fall in EF.  He has a contrast allergy so will have to be premedicated.  Procedure may be more difficult due to prior TAVR with Medtronic valve. We discussed risks/benefits and he agreed to procedure.  3. CKD: Stage 3.  BMET today.  4. Aortic stenosis: S/p TAVR with Medtronic Evolut valve, valve was functioning  normally on 5/23 echo.   Followup in 3 wks.   Loralie Champagne 02/19/2022

## 2022-02-20 ENCOUNTER — Other Ambulatory Visit (HOSPITAL_COMMUNITY): Payer: Self-pay

## 2022-02-20 ENCOUNTER — Telehealth (HOSPITAL_COMMUNITY): Payer: Self-pay | Admitting: Surgery

## 2022-02-20 MED ORDER — MAGNESIUM OXIDE 400 MG PO CAPS: 400.0000 mg | ORAL_CAPSULE | Freq: Two times a day (BID) | ORAL | 6 refills | Status: DC

## 2022-02-20 MED ORDER — POTASSIUM CHLORIDE CRYS ER 20 MEQ PO TBCR: 20.0000 meq | EXTENDED_RELEASE_TABLET | Freq: Every day | ORAL | 0 refills | Status: DC

## 2022-02-20 NOTE — Telephone Encounter (Signed)
I clarified with Dr. Aundra Dubin and called patient. Per Dr. Aundra Dubin patient is to discontinue Isosorbide 30 mg.  Patient instructed to increase Mag Ox to 400 mg twice daily and continue Potassium 20 meq daily.  I have updated medication list in [facility].

## 2022-02-20 NOTE — Telephone Encounter (Signed)
-----   Message from Larey Dresser, MD sent at 02/19/2022  3:58 PM EDT ----- Start magnesium oxide 400 mg daily with low Mg

## 2022-02-28 ENCOUNTER — Telehealth (HOSPITAL_COMMUNITY): Payer: Self-pay

## 2022-02-28 NOTE — Telephone Encounter (Signed)
Spoke to wife about Catheterization for tomorrow. Aware of nothing to eat or drink after midnight.Aware of taking pre medication tonight and tomorrow. Holding diabetic meds and lasix am of procedure. Has transportation.

## 2022-03-01 ENCOUNTER — Other Ambulatory Visit: Payer: Self-pay

## 2022-03-01 ENCOUNTER — Ambulatory Visit (HOSPITAL_COMMUNITY)
Admission: RE | Admit: 2022-03-01 | Discharge: 2022-03-01 | Disposition: A | Discharge status: Home / Self Care | Payer: No Typology Code available for payment source | Source: Ambulatory Visit | Attending: Cardiology | Admitting: Cardiology

## 2022-03-01 ENCOUNTER — Other Ambulatory Visit (HOSPITAL_COMMUNITY): Payer: No Typology Code available for payment source

## 2022-03-01 ENCOUNTER — Encounter (HOSPITAL_COMMUNITY): Payer: Self-pay | Admitting: Cardiology

## 2022-03-01 ENCOUNTER — Encounter (HOSPITAL_COMMUNITY)
Admission: RE | Disposition: A | Discharge status: Home / Self Care | Payer: Self-pay | Source: Ambulatory Visit | Attending: Cardiology

## 2022-03-01 DIAGNOSIS — I509 Heart failure, unspecified: Secondary | ICD-10-CM | POA: Diagnosis not present

## 2022-03-01 DIAGNOSIS — I429 Cardiomyopathy, unspecified: Secondary | ICD-10-CM | POA: Diagnosis not present

## 2022-03-01 DIAGNOSIS — Z952 Presence of prosthetic heart valve: Secondary | ICD-10-CM | POA: Insufficient documentation

## 2022-03-01 DIAGNOSIS — I5022 Chronic systolic (congestive) heart failure: Secondary | ICD-10-CM | POA: Insufficient documentation

## 2022-03-01 DIAGNOSIS — I2510 Atherosclerotic heart disease of native coronary artery without angina pectoris: Secondary | ICD-10-CM | POA: Insufficient documentation

## 2022-03-01 DIAGNOSIS — Z7982 Long term (current) use of aspirin: Secondary | ICD-10-CM | POA: Diagnosis not present

## 2022-03-01 DIAGNOSIS — Z8616 Personal history of COVID-19: Secondary | ICD-10-CM | POA: Insufficient documentation

## 2022-03-01 DIAGNOSIS — N183 Chronic kidney disease, stage 3 unspecified: Secondary | ICD-10-CM | POA: Insufficient documentation

## 2022-03-01 DIAGNOSIS — I130 Hypertensive heart and chronic kidney disease with heart failure and stage 1 through stage 4 chronic kidney disease, or unspecified chronic kidney disease: Secondary | ICD-10-CM | POA: Insufficient documentation

## 2022-03-01 DIAGNOSIS — Z955 Presence of coronary angioplasty implant and graft: Secondary | ICD-10-CM | POA: Insufficient documentation

## 2022-03-01 DIAGNOSIS — Z79899 Other long term (current) drug therapy: Secondary | ICD-10-CM | POA: Insufficient documentation

## 2022-03-01 DIAGNOSIS — E1122 Type 2 diabetes mellitus with diabetic chronic kidney disease: Secondary | ICD-10-CM | POA: Diagnosis not present

## 2022-03-01 DIAGNOSIS — I447 Left bundle-branch block, unspecified: Secondary | ICD-10-CM | POA: Insufficient documentation

## 2022-03-01 DIAGNOSIS — I5032 Chronic diastolic (congestive) heart failure: Secondary | ICD-10-CM

## 2022-03-01 HISTORY — PX: RIGHT/LEFT HEART CATH AND CORONARY ANGIOGRAPHY: CATH118266

## 2022-03-01 LAB — POCT I-STAT EG7
Acid-base deficit: 5 mmol/L — ABNORMAL HIGH (ref 0.0–2.0)
Acid-base deficit: 5 mmol/L — ABNORMAL HIGH (ref 0.0–2.0)
Bicarbonate: 21.7 mmol/L (ref 20.0–28.0)
Bicarbonate: 21.7 mmol/L (ref 20.0–28.0)
Calcium, Ion: 1.17 mmol/L (ref 1.15–1.40)
Calcium, Ion: 1.19 mmol/L (ref 1.15–1.40)
HCT: 42 % (ref 39.0–52.0)
HCT: 43 % (ref 39.0–52.0)
Hemoglobin: 14.3 g/dL (ref 13.0–17.0)
Hemoglobin: 14.6 g/dL (ref 13.0–17.0)
O2 Saturation: 76 %
O2 Saturation: 76 %
Potassium: 4.7 mmol/L (ref 3.5–5.1)
Potassium: 4.8 mmol/L (ref 3.5–5.1)
Sodium: 140 mmol/L (ref 135–145)
Sodium: 140 mmol/L (ref 135–145)
TCO2: 23 mmol/L (ref 22–32)
TCO2: 23 mmol/L (ref 22–32)
pCO2, Ven: 44.6 mmHg (ref 44–60)
pCO2, Ven: 44.9 mmHg (ref 44–60)
pH, Ven: 7.292 (ref 7.25–7.43)
pH, Ven: 7.295 (ref 7.25–7.43)
pO2, Ven: 45 mmHg (ref 32–45)
pO2, Ven: 46 mmHg — ABNORMAL HIGH (ref 32–45)

## 2022-03-01 LAB — BASIC METABOLIC PANEL
Anion gap: 10 (ref 5–15)
BUN: 34 mg/dL — ABNORMAL HIGH (ref 8–23)
CO2: 22 mmol/L (ref 22–32)
Calcium: 8.5 mg/dL — ABNORMAL LOW (ref 8.9–10.3)
Chloride: 106 mmol/L (ref 98–111)
Creatinine, Ser: 1.5 mg/dL — ABNORMAL HIGH (ref 0.61–1.24)
GFR, Estimated: 47 mL/min — ABNORMAL LOW (ref >60)
Glucose, Bld: 221 mg/dL — ABNORMAL HIGH (ref 70–99)
Potassium: 5.6 mmol/L — ABNORMAL HIGH (ref 3.5–5.1)
Sodium: 138 mmol/L (ref 135–145)

## 2022-03-01 LAB — GLUCOSE, CAPILLARY: Glucose-Capillary: 191 mg/dL — ABNORMAL HIGH (ref 70–99)

## 2022-03-01 SURGERY — RIGHT/LEFT HEART CATH AND CORONARY ANGIOGRAPHY
Anesthesia: LOCAL

## 2022-03-01 MED ORDER — MIDAZOLAM HCL 2 MG/2ML IJ SOLN
INTRAMUSCULAR | Status: AC
  Filled 2022-03-01: qty 2

## 2022-03-01 MED ORDER — ASPIRIN 81 MG PO CHEW: 81.0000 mg | CHEWABLE_TABLET | Freq: Once | ORAL | Status: DC

## 2022-03-01 MED ORDER — ACETAMINOPHEN 325 MG PO TABS: 650.0000 mg | ORAL_TABLET | ORAL | Status: DC | PRN

## 2022-03-01 MED ORDER — SODIUM CHLORIDE 0.9 % IV SOLN: INTRAVENOUS | Status: DC

## 2022-03-01 MED ORDER — LABETALOL HCL 5 MG/ML IV SOLN: 10.0000 mg | INTRAVENOUS | Status: DC | PRN

## 2022-03-01 MED ORDER — ONDANSETRON HCL 4 MG/2ML IJ SOLN: 4.0000 mg | Freq: Four times a day (QID) | INTRAMUSCULAR | Status: DC | PRN

## 2022-03-01 MED ORDER — SODIUM CHLORIDE 0.9 % IV SOLN: 250.0000 mL | INTRAVENOUS | Status: DC | PRN

## 2022-03-01 MED ORDER — SODIUM CHLORIDE 0.9% FLUSH: 3.0000 mL | INTRAVENOUS | Status: DC | PRN

## 2022-03-01 MED ORDER — VERAPAMIL HCL 2.5 MG/ML IV SOLN
INTRAVENOUS | Status: AC
  Filled 2022-03-01: qty 2

## 2022-03-01 MED ORDER — FENTANYL CITRATE (PF) 100 MCG/2ML IJ SOLN
INTRAMUSCULAR | Status: DC | PRN
Start: 2022-03-01 — End: 2022-03-01
  Administered 2022-03-01: 25 ug via INTRAVENOUS

## 2022-03-01 MED ORDER — HEPARIN (PORCINE) IN NACL 1000-0.9 UT/500ML-% IV SOLN
INTRAVENOUS | Status: DC | PRN
  Administered 2022-03-01 (×2): 500 mL

## 2022-03-01 MED ORDER — HEPARIN SODIUM (PORCINE) 1000 UNIT/ML IJ SOLN
INTRAMUSCULAR | Status: DC | PRN
  Administered 2022-03-01: 4000 [IU] via INTRAVENOUS

## 2022-03-01 MED ORDER — MIDAZOLAM HCL 2 MG/2ML IJ SOLN
INTRAMUSCULAR | Status: DC | PRN
  Administered 2022-03-01: 1 mg via INTRAVENOUS

## 2022-03-01 MED ORDER — HYDRALAZINE HCL 20 MG/ML IJ SOLN: 10.0000 mg | INTRAMUSCULAR | Status: DC | PRN

## 2022-03-01 MED ORDER — FENTANYL CITRATE (PF) 100 MCG/2ML IJ SOLN
INTRAMUSCULAR | Status: AC
  Filled 2022-03-01: qty 2

## 2022-03-01 MED ORDER — HEPARIN (PORCINE) IN NACL 1000-0.9 UT/500ML-% IV SOLN
INTRAVENOUS | Status: AC
  Filled 2022-03-01: qty 500

## 2022-03-01 MED ORDER — LIDOCAINE HCL (PF) 1 % IJ SOLN
INTRAMUSCULAR | Status: DC | PRN
  Administered 2022-03-01 (×2): 2 mL

## 2022-03-01 MED ORDER — VERAPAMIL HCL 2.5 MG/ML IV SOLN
INTRAVENOUS | Status: DC | PRN
  Administered 2022-03-01: 10 mL via INTRA_ARTERIAL

## 2022-03-01 MED ORDER — SODIUM CHLORIDE 0.9% FLUSH: 3.0000 mL | Freq: Two times a day (BID) | INTRAVENOUS | Status: DC

## 2022-03-01 MED ORDER — IOHEXOL 350 MG/ML SOLN
INTRAVENOUS | Status: DC | PRN
  Administered 2022-03-01: 80 mL

## 2022-03-01 MED ORDER — LIDOCAINE HCL (PF) 1 % IJ SOLN
INTRAMUSCULAR | Status: AC
  Filled 2022-03-01: qty 30

## 2022-03-01 SURGICAL SUPPLY — 13 items
BAND CMPR LRG ZPHR (HEMOSTASIS) ×1
BAND ZEPHYR COMPRESS 30 LONG (HEMOSTASIS) ×1 IMPLANT
CATH 5FR JL3.5 JR4 ANG PIG MP (CATHETERS) ×1 IMPLANT
CATH BALLN WEDGE 5F 110CM (CATHETERS) ×1 IMPLANT
CATH LAUNCHER 6FR JL3 (CATHETERS) ×1 IMPLANT
ELECT DEFIB PAD ADLT CADENCE (PAD) ×1 IMPLANT
GLIDESHEATH SLEND SS 6F .021 (SHEATH) ×1 IMPLANT
GUIDEWIRE INQWIRE 1.5J.035X260 (WIRE) IMPLANT
INQWIRE 1.5J .035X260CM (WIRE) ×4
KIT HEART LEFT (KITS) ×2 IMPLANT
PACK CARDIAC CATHETERIZATION (CUSTOM PROCEDURE TRAY) ×2 IMPLANT
SHEATH GLIDE SLENDER 4/5FR (SHEATH) ×1 IMPLANT
TRANSDUCER W/STOPCOCK (MISCELLANEOUS) ×2 IMPLANT

## 2022-03-01 NOTE — Interval H&P Note (Signed)
History and Physical Interval Note:  03/01/2022 9:40 AM  Andre Jimenez  has presented today for surgery, with the diagnosis of heart failure.  The various methods of treatment have been discussed with the patient and family. After consideration of risks, benefits and other options for treatment, the patient has consented to  Procedure(s): RIGHT/LEFT HEART CATH AND CORONARY ANGIOGRAPHY (N/A) as a surgical intervention.  The patient's history has been reviewed, patient examined, no change in status, stable for surgery.  I have reviewed the patient's chart and labs.  Questions were answered to the patient's satisfaction.     Alexxis Mackert [employer]

## 2022-03-01 NOTE — Discharge Instructions (Signed)
Femoral Site Care This sheet gives you information about how to care for yourself after your procedure. Your health care provider may also give you more specific instructions. If you have problems or questions, contact your health care provider. What can I expect after the procedure?  After the procedure, it is common to have: Bruising that usually fades within 1-2 weeks. Tenderness at the site. Follow these instructions at home: Wound care Follow instructions from your health care provider about how to take care of your insertion site. Make sure you: Wash your hands with soap and water before you change your bandage (dressing). If soap and water are not available, use hand sanitizer. Remove your dressing as told by your health care provider. 24 hours Do not take baths, swim, or use a hot tub until your health care provider approves. You may shower 24-48 hours after the procedure or as told by your health care provider. Gently wash the site with plain soap and water. Pat the area dry with a clean towel. Do not rub the site. This may cause bleeding. Do not apply powder or lotion to the site. Keep the site clean and dry. Check your femoral site every day for signs of infection. Check for: Redness, swelling, or pain. Fluid or blood. Warmth. Pus or a bad smell. Activity For the first 2-3 days after your procedure, or as long as directed: Avoid climbing stairs as much as possible. Do not squat. Do not lift anything that is heavier than 10 lb (4.5 kg), or the limit that you are told, until your health care provider says that it is safe. For 5 days Rest as directed. Avoid sitting for a long time without moving. Get up to take short walks every 1-2 hours. Do not drive for 24 hours if you were given a medicine to help you relax (sedative). General instructions Take over-the-counter and prescription medicines only as told by your health care provider. Keep all follow-up visits as told by your  health care provider. This is important. Contact a health care provider if you have: A fever or chills. You have redness, swelling, or pain around your insertion site. Get help right away if: The catheter insertion area swells very fast. You pass out. You suddenly start to sweat or your skin gets clammy. The catheter insertion area is bleeding, and the bleeding does not stop when you hold steady pressure on the area. The area near or just beyond the catheter insertion site becomes pale, cool, tingly, or numb. These symptoms may represent a serious problem that is an emergency. Do not wait to see if the symptoms will go away. Get medical help right away. Call your local emergency services (911 in the [place] Do not drive yourself to the hospital. Summary After the procedure, it is common to have bruising that usually fades within 1-2 weeks. Check your femoral site every day for signs of infection. Do not lift anything that is heavier than 10 lb (4.5 kg), or the limit that you are told, until your health care provider says that it is safe. This information is not intended to replace advice given to you by your health care provider. Make sure you discuss any questions you have with your health care provider. Document Revised: 09/30/2017 Document Reviewed: 09/30/2017 Elsevier Patient Education  2020 Elsevier Inc.'  

## 2022-04-04 ENCOUNTER — Ambulatory Visit (HOSPITAL_COMMUNITY)
Admission: RE | Admit: 2022-04-04 | Discharge: 2022-04-04 | Disposition: A | Discharge status: Home / Self Care | Payer: No Typology Code available for payment source | Source: Ambulatory Visit | Attending: Cardiology | Admitting: Cardiology

## 2022-04-04 ENCOUNTER — Encounter (HOSPITAL_COMMUNITY): Payer: Self-pay | Admitting: Cardiology

## 2022-04-04 VITALS — BP 122/68 | HR 64 | Wt 184.4 lb

## 2022-04-04 DIAGNOSIS — Z79899 Other long term (current) drug therapy: Secondary | ICD-10-CM | POA: Insufficient documentation

## 2022-04-04 DIAGNOSIS — E1122 Type 2 diabetes mellitus with diabetic chronic kidney disease: Secondary | ICD-10-CM | POA: Insufficient documentation

## 2022-04-04 DIAGNOSIS — Z955 Presence of coronary angioplasty implant and graft: Secondary | ICD-10-CM | POA: Diagnosis not present

## 2022-04-04 DIAGNOSIS — Z952 Presence of prosthetic heart valve: Secondary | ICD-10-CM | POA: Diagnosis not present

## 2022-04-04 DIAGNOSIS — I5022 Chronic systolic (congestive) heart failure: Secondary | ICD-10-CM | POA: Diagnosis not present

## 2022-04-04 DIAGNOSIS — I130 Hypertensive heart and chronic kidney disease with heart failure and stage 1 through stage 4 chronic kidney disease, or unspecified chronic kidney disease: Secondary | ICD-10-CM | POA: Diagnosis not present

## 2022-04-04 DIAGNOSIS — R0789 Other chest pain: Secondary | ICD-10-CM | POA: Diagnosis not present

## 2022-04-04 DIAGNOSIS — N183 Chronic kidney disease, stage 3 unspecified: Secondary | ICD-10-CM | POA: Insufficient documentation

## 2022-04-04 DIAGNOSIS — I447 Left bundle-branch block, unspecified: Secondary | ICD-10-CM | POA: Insufficient documentation

## 2022-04-04 DIAGNOSIS — Z7982 Long term (current) use of aspirin: Secondary | ICD-10-CM | POA: Diagnosis not present

## 2022-04-04 DIAGNOSIS — Z7984 Long term (current) use of oral hypoglycemic drugs: Secondary | ICD-10-CM | POA: Insufficient documentation

## 2022-04-04 DIAGNOSIS — I2510 Atherosclerotic heart disease of native coronary artery without angina pectoris: Secondary | ICD-10-CM | POA: Diagnosis not present

## 2022-04-04 DIAGNOSIS — I5032 Chronic diastolic (congestive) heart failure: Secondary | ICD-10-CM | POA: Diagnosis not present

## 2022-04-04 DIAGNOSIS — I350 Nonrheumatic aortic (valve) stenosis: Secondary | ICD-10-CM | POA: Insufficient documentation

## 2022-04-04 DIAGNOSIS — Z7902 Long term (current) use of antithrombotics/antiplatelets: Secondary | ICD-10-CM | POA: Insufficient documentation

## 2022-04-04 DIAGNOSIS — Z9861 Coronary angioplasty status: Secondary | ICD-10-CM

## 2022-04-04 LAB — BASIC METABOLIC PANEL
Anion gap: 19 — ABNORMAL HIGH (ref 5–15)
BUN: 21 mg/dL (ref 8–23)
CO2: 18 mmol/L — ABNORMAL LOW (ref 22–32)
Calcium: 8.6 mg/dL — ABNORMAL LOW (ref 8.9–10.3)
Chloride: 107 mmol/L (ref 98–111)
Creatinine, Ser: 1.19 mg/dL (ref 0.61–1.24)
GFR, Estimated: >60 mL/min (ref >60)
Glucose, Bld: 72 mg/dL (ref 70–99)
Potassium: 4.6 mmol/L (ref 3.5–5.1)
Sodium: 144 mmol/L (ref 135–145)

## 2022-04-04 LAB — BRAIN NATRIURETIC PEPTIDE: B Natriuretic Peptide: 585.7 pg/mL — ABNORMAL HIGH (ref 0.0–100.0)

## 2022-04-04 MED ORDER — DAPAGLIFLOZIN PROPANEDIOL 10 MG PO TABS: 10.0000 mg | ORAL_TABLET | Freq: Every day | ORAL | 11 refills | Status: DC

## 2022-04-04 NOTE — Patient Instructions (Addendum)
EKG done today.  Labs done today. We will contact you only if your labs are abnormal.  STOP taking Potassium  STOP taking Lasix  START Farxiga '10mg'$  (1 tablet) by mouth daily.   No other medication changes were made. Please continue all current medications as prescribed.  Your physician recommends that you schedule a follow-up appointment in: 10 days for a lab only appointment, 3 weeks for an appointment with our Clinic Pharmacist and in 6-8 weeks with our NP/PA clinic here in our office.   If you have any questions or concerns before your next appointment please send [place] a message through [place] or call our office at [phone].    TO LEAVE A MESSAGE FOR THE NURSE SELECT OPTION 2, PLEASE LEAVE A MESSAGE INCLUDING: YOUR NAME DATE OF BIRTH CALL BACK NUMBER REASON FOR CALL**this is important as we prioritize the call backs  YOU WILL RECEIVE A CALL BACK THE SAME DAY AS LONG AS YOU CALL BEFORE 4:00 PM   Do the following things EVERYDAY: Weigh yourself in the morning before breakfast. Write it down and keep it in a log. Take your medicines as prescribed Eat low salt foods--Limit salt (sodium) to 2000 mg per day.  Stay as active as you can everyday Limit all fluids for the day to less than 2 liters   At the [facility], you and your health needs are our priority. As part of our continuing mission to provide you with exceptional heart care, we have created designated Provider Care Teams. These Care Teams include your primary Cardiologist (physician) and Advanced Practice Providers (APPs- Physician Assistants and Nurse Practitioners) who all work together to provide you with the care you need, when you need it.   You may see any of the following providers on your designated Care Team at your next follow up: Dr Glori Bickers Dr Haynes Kerns, NP Lyda Jester, [place] Audry Riles, PharmD   Please be sure to bring in all your medications bottles to  every appointment.

## 2022-04-04 NOTE — Progress Notes (Signed)
PCP: Myrlene Broker, MD Cardiology: Dr. Bettina Gavia HF Cardiology: Dr. Aundra Dubin  81 y.o. with history of CAD, aortic stenosis s/p TAVR, and systolic CHF was self-referred to CHF clinic for evaluation of cardiomyopathy.  Patient was found to have severe AS in 2021.  Pre-TAVR evaluation showed severe proximal LAD stenosis treated by DES in 4/21.  He had TAVR in 5/21 with 29 mm Medtronic Evolut THV.  Echo in 5/22 showed EF 50-55%, normal TAVR valve.   He developed gradually worsening exertional dyspnea over the last 2 months.  He especially noted dyspnea walking up a hill or incline.  He has also had on and off "soreness" in his chest.  This is not clearly exertional.  He developed orthopnea.  He was admitted in 5/23 at [facility] with CHF.  He was incidentally found to have COVID-19.  Echo showed EF down to 30-35% with normal TAVR valve function.  He was diuresed and had a right thoracentesis.  He felt much better by the time of discharge.    RHC/LHC was done in 6/23; filling pressures were optimized and CO was normal, moderate disease in OM2 did not explain fall in EF.    He returns today for followup of CHF.  He is doing well symptomatically.  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  No dyspnea with usual activities.  No chest pain.  No orthopnea/PND.   ECG (personally reviewed): NSR, IVCD 130 msec msec   Labs (5/23): K 4.2, creatinine 1.5, LDL 71 Labs (6/23): K 5.6, creatinine 1.5  PMH:  1. Barrett's esophagus 2. Chronic back pain 3. HTN 4. Type 2 diabetes 5. Contrast allergy 6. CKD stage 3 7. Aortic stenosis: Severe AS, now s/p TAVR in 5/21 with 29 mm Medtronic Evolut Pro.  8. CAD: 4/21 PCI to proximal LAD (pre-TAVR).  - LHC (6/23): 70% OM2, 50% ostial RCA 9. Carotid stenosis: Carotid dopplers (5/21) with 50-69% RICA stenosis.  10. COVID-19 5/23 11. Chronic systolic CHF: Cause uncertain.   - Echo (5/22): EF 50-55%, normal RV, normal bioprosthetic AoV s/p TAVR mean gradient 10 mmHg - Echo (5/23): EF  30-35%, normal RV, normal bioprosthetic AoV s/p TAVR mean gradient 7 mmHg - RHC (6/23): mean RA 2, PA 27/7, mean PCWP 5, CI 3.01 12. LBBB  SH: Lives in [place], nonsmoker, no ETOH. Married.   Family History  Problem Relation Age of Onset   Diabetes Mother    Heart disease Father    Hypertension Father    ROS: All systems reviewed and negative except as per HPI.   Current Outpatient Medications  Medication Sig Dispense Refill   acetaminophen (TYLENOL) 500 MG tablet Take 1,000 mg by mouth every 6 (six) hours as needed for moderate pain (Back pain).     aspirin EC 81 MG tablet Take 81 mg by mouth daily.     atorvastatin (LIPITOR) 40 MG tablet Take 1 tablet (40 mg total) by mouth daily. 30 tablet 3   carvedilol (COREG) 3.125 MG tablet Take 1 tablet (3.125 mg total) by mouth 2 (two) times daily. 180 tablet 3   colchicine 0.6 MG tablet Take 2 tablets by mouth as needed. For gout attacks     dapagliflozin propanediol (FARXIGA) 10 MG TABS tablet Take 1 tablet (10 mg total) by mouth daily before breakfast. 30 tablet 11   Fluocinolone Acetonide 0.01 % OIL Place 1 drop in ear(s) daily as needed (itching).     glipiZIDE (GLUCOTROL XL) 5 MG 24 hr tablet Take 5 mg by  mouth in the morning and at bedtime.     Magnesium Oxide 400 MG CAPS Take 1 capsule (400 mg total) by mouth 2 (two) times daily. 60 capsule 6   metFORMIN (GLUCOPHAGE) 1000 MG tablet Take 1,000 mg by mouth 2 (two) times daily with a meal.     Multiple Vitamins-Minerals (PRESERVISION AREDS 2+MULTI VIT) CAPS Take 1 capsule by mouth daily.     pantoprazole (PROTONIX) 40 MG tablet Take 1 tablet (40 mg total) by mouth daily. 90 tablet 3   sacubitril-valsartan (ENTRESTO) 24-26 MG Take 1 tablet by mouth 2 (two) times daily. 60 tablet 11   No current facility-administered medications for this encounter.   BP 122/68   Pulse 64   Wt 83.6 kg (184 lb 6.4 oz)   SpO2 94%   BMI 28.88 kg/m  General: NAD Neck: No JVD, no thyromegaly or thyroid  nodule.  Lungs: Clear to auscultation bilaterally with normal respiratory effort. CV: Nondisplaced PMI.  Heart regular S1/S2, no S3/S4, 2/6 early SEM RUSB.  No peripheral edema.  No carotid bruit.  Normal pedal pulses.  Abdomen: Soft, nontender, no hepatosplenomegaly, no distention.  Skin: Intact without lesions or rashes.  Neurologic: Alert and oriented x 3.  Psych: Normal affect. Extremities: No clubbing or cyanosis.  HEENT: Normal.   Assessment/Plan: 1. Chronic systolic CHF: Echo in 4/92 with EF 30-35%, normally-functioning bioprosthetic AoV and normal RV.  Most recent prior echo from 5/22 showed EF 50-55%.  Fall in EF was associated with CHF exacerbation and atypical chest pain.  He also has LBBB that is new since 3/22.  RHC/LHC was done in 6/23; filling pressures were optimized and CO was normal, moderate disease in OM2 did not explain fall in EF.  On exam today, he is not volume overloaded.  NYHA class I-II symptoms.  - Continue Coreg 3.125 mg bid.  - Continue Entresto 24/26 bid. - Start Farxiga 10 mg daily and stop Lasix and KCl (with recent elevated K).  BMET today and in 10 days.  - LBBB is not markedly wide, only 130 msec today.  Does not appear wide enough that he would benefit from CRT.  - Repeat echo in the fall to reassess EF.  With advanced age, probably would not recommend ICD unless ventricular arrhythmias noted.  2. CAD: PCI to proximal LAD pre-TAVR in 4/21, not associated with ACS.  Cath in 6/23 showed 70% OM2 stenosis, medically managed.  - Continue ASA 81 - Continue atorvastatin 40 daily, 5/23 lipids acceptable.  3. CKD: Stage 3.  BMET today.  4. Aortic stenosis: S/p TAVR with Medtronic Evolut valve, valve was functioning normally on 5/23 echo.   Followup in 3 wks with HF pharmacist for medication titration, see APP in 6-8 wks.   Loralie Champagne 04/04/2022

## 2022-04-05 ENCOUNTER — Telehealth (HOSPITAL_COMMUNITY): Payer: Self-pay | Admitting: Pharmacist

## 2022-04-05 MED ORDER — EMPAGLIFLOZIN 25 MG PO TABS: ORAL_TABLET | ORAL | 3 refills | Status: DC

## 2022-04-05 NOTE — Telephone Encounter (Signed)
Spoke with patient's wife. Will stop [place] and start Jardiance due to insurance preference. Will send in Jardiance 25 mg tablet strength as the 10 mg tablet strength is not carried by the [place]. Patient will take 1/2 tablet daily.   Audry Riles, PharmD, BCPS, BCCP, CPP Heart Failure Clinic Pharmacist [phone]

## 2022-04-05 NOTE — Telephone Encounter (Signed)
Received message from [place] that Wilder Glade is not covered. Preferred alternative is Jardiance. Left VM for patient to call me back so we can discuss changing to Jardiance.   Audry Riles, PharmD, BCPS, BCCP, CPP Heart Failure Clinic Pharmacist [phone]

## 2022-04-09 NOTE — Progress Notes (Signed)
Advanced Heart Failure Clinic Note   PCP: Myrlene Broker, MD Cardiology: Dr. Bettina Gavia HF Cardiology: Dr. Aundra Dubin  HPI:  81 y.o. with history of CAD, aortic stenosis s/p TAVR, and systolic CHF was self-referred to CHF clinic for evaluation of cardiomyopathy.  Patient was found to have severe AS in 2021.  Pre-TAVR evaluation showed severe proximal LAD stenosis treated by DES in 12/2019.  He had TAVR in 01/2020 with 29 mm Medtronic Evolut THV.  Echo in 01/2021 showed EF 50-55%, normal TAVR valve.    He developed gradually worsening exertional dyspnea over the last 2 months.  He especially noted dyspnea walking up a hill or incline.  He has also had on and off "soreness" in his chest.  This is not clearly exertional.  He developed orthopnea.  He was admitted in 01/2022 at [facility] with CHF.  He was incidentally found to have COVID-19.  Echo showed EF down to 30-35% with normal TAVR valve function.  He was diuresed and had a right thoracentesis.  He felt much better by the time of discharge.     RHC/LHC was done in 03/2022; filling pressures were optimized and CO was normal, moderate disease in OM2 did not explain fall in EF.     He returned to [facility] for followup of CHF on 04/04/22.  He was doing well symptomatically.  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  No dyspnea with usual activities.  No chest pain.  No orthopnea/PND.   Today he returns to HF clinic for pharmacist medication titration. At last visit with MD, Wilder Glade was initiated and Lasix/KCL was discontinued. However, Wilder Glade was changed to [place] due to insurance preference.  Repeat labs were obtained on 04/16/22, K was low at 3.2. KCL 20 mEq daily was restarted. Overall he is feeling worse than his last visit. Main complaint is SOB. He now gets SOB after walking 100 yards or when bending over. Has also been feeling weaker. No dizziness or lightheadedness. No CP or palpitations. He does not weigh himself daily at home. States the last time  he weighed himself, he was 188 lbs. His weight is up 13 lbs from last clinic visit 3 weeks ago. Has 1+ LEE although he says he is not sure this is different from his baseline. Notes PND.    HF Medications: Carvedilol 3.125 mg BID Entresto 24/26 mg BID Jardiance 12.5 mg daily  Has the patient been experiencing any side effects to the medications prescribed?  no  Does the patient have any problems obtaining medications due to transportation or finances?   No - receives medications from the [place].  Understanding of regimen: fair Understanding of indications: fair Potential of compliance: good - his wife helps him manage his medications.  Patient understands to avoid NSAIDs. Patient understands to avoid decongestants.    Pertinent Lab Values: Serum creatinine 1.22, BUN 14, Potassium 4.0, Sodium 142  Vital Signs: Weight: 197.6 lbs (last clinic weight: 184.4 lbs) Blood pressure: 146/80  Heart rate: 72   Assessment/Plan: 1. Chronic systolic CHF: Echo in 06/3817 with EF 30-35%, normally-functioning bioprosthetic AoV and normal RV.  Most recent prior echo from 01/2021 showed EF 50-55%.  Fall in EF was associated with CHF exacerbation and atypical chest pain.  He also has LBBB that is new since 11/2020.  RHC/LHC was done in 03/2022; filling pressures were optimized and CO was normal, moderate disease in OM2 did not explain fall in EF.    - NYHA class II-III  symptoms. Volume overloaded. Weight up 13 lbs from last visit.  - BMET today stable - Take Lasix 40 mg x2 days then restart Lasix 20 mg daily. Repeat BMET in 2 weeks at [place] carvedilol 3.125 mg BID.  - Increase Entresto to 49/51 mg BID. - Continue Jardiance 12.5 mg daily ([facility] only carries 25 mg tablets) - LBBB is not markedly wide, only 130 msec today.  Does not appear wide enough that he would benefit from CRT.  - Repeat echo in the fall to reassess EF.  With advanced age, probably would not recommend ICD unless ventricular  arrhythmias noted.  2. CAD: PCI to proximal LAD pre-TAVR in 12/2019, not associated with ACS.  Cath in 03/2022 showed 70% OM2 stenosis, medically managed.  - Continue ASA 81 mg daily - Continue atorvastatin 40 mg daily, 01/2022 lipids acceptable.  3. CKD: Stage 3.  4. Aortic stenosis: S/p TAVR with Medtronic Evolut valve, valve was functioning normally on 01/2022 echo.   Follow up 5 weeks with APP Clinic.    Audry Riles, PharmD, BCPS, BCCP, CPP Heart Failure Clinic Pharmacist [phone]

## 2022-04-16 ENCOUNTER — Ambulatory Visit (HOSPITAL_COMMUNITY)
Admission: RE | Admit: 2022-04-16 | Discharge: 2022-04-16 | Disposition: A | Discharge status: Home / Self Care | Payer: No Typology Code available for payment source | Source: Ambulatory Visit | Attending: Internal Medicine | Admitting: Internal Medicine

## 2022-04-16 DIAGNOSIS — I5032 Chronic diastolic (congestive) heart failure: Secondary | ICD-10-CM | POA: Insufficient documentation

## 2022-04-16 LAB — BASIC METABOLIC PANEL
Anion gap: 8 (ref 5–15)
BUN: 12 mg/dL (ref 8–23)
CO2: 24 mmol/L (ref 22–32)
Calcium: 7.8 mg/dL — ABNORMAL LOW (ref 8.9–10.3)
Chloride: 109 mmol/L (ref 98–111)
Creatinine, Ser: 1.27 mg/dL — ABNORMAL HIGH (ref 0.61–1.24)
GFR, Estimated: 57 mL/min — ABNORMAL LOW (ref >60)
Glucose, Bld: 137 mg/dL — ABNORMAL HIGH (ref 70–99)
Potassium: 3.2 mmol/L — ABNORMAL LOW (ref 3.5–5.1)
Sodium: 141 mmol/L (ref 135–145)

## 2022-04-18 ENCOUNTER — Telehealth (HOSPITAL_COMMUNITY): Payer: Self-pay | Admitting: Cardiology

## 2022-04-18 MED ORDER — POTASSIUM CHLORIDE CRYS ER 20 MEQ PO TBCR: 20.0000 meq | EXTENDED_RELEASE_TABLET | Freq: Every day | ORAL | 3 refills | Status: DC

## 2022-04-18 NOTE — Telephone Encounter (Signed)
-----   Message from Larey Dresser, MD sent at 04/16/2022  9:11 PM EDT ----- Increase total daily KCl by 20 mEq. BMET 10 days.

## 2022-04-19 ENCOUNTER — Other Ambulatory Visit (HOSPITAL_COMMUNITY): Payer: Self-pay

## 2022-04-19 MED ORDER — POTASSIUM CHLORIDE CRYS ER 20 MEQ PO TBCR: 20.0000 meq | EXTENDED_RELEASE_TABLET | Freq: Every day | ORAL | 3 refills | Status: DC

## 2022-04-19 NOTE — Telephone Encounter (Signed)
Meds ordered this encounter  Medications   potassium chloride SA (KLOR-CON M) 20 MEQ tablet    Sig: Take 1 tablet (20 mEq total) by mouth daily.    Dispense:  90 tablet    Refill:  3

## 2022-04-25 ENCOUNTER — Ambulatory Visit (HOSPITAL_COMMUNITY)
Admission: RE | Admit: 2022-04-25 | Discharge: 2022-04-25 | Disposition: A | Discharge status: Home / Self Care | Payer: No Typology Code available for payment source | Source: Ambulatory Visit | Attending: Internal Medicine | Admitting: Internal Medicine

## 2022-04-25 VITALS — BP 146/80 | HR 72 | Wt 197.6 lb

## 2022-04-25 DIAGNOSIS — Z8616 Personal history of COVID-19: Secondary | ICD-10-CM | POA: Insufficient documentation

## 2022-04-25 DIAGNOSIS — I350 Nonrheumatic aortic (valve) stenosis: Secondary | ICD-10-CM | POA: Diagnosis not present

## 2022-04-25 DIAGNOSIS — I2510 Atherosclerotic heart disease of native coronary artery without angina pectoris: Secondary | ICD-10-CM | POA: Diagnosis not present

## 2022-04-25 DIAGNOSIS — Z79899 Other long term (current) drug therapy: Secondary | ICD-10-CM | POA: Diagnosis not present

## 2022-04-25 DIAGNOSIS — I447 Left bundle-branch block, unspecified: Secondary | ICD-10-CM | POA: Insufficient documentation

## 2022-04-25 DIAGNOSIS — Z7901 Long term (current) use of anticoagulants: Secondary | ICD-10-CM | POA: Insufficient documentation

## 2022-04-25 DIAGNOSIS — R0602 Shortness of breath: Secondary | ICD-10-CM | POA: Diagnosis present

## 2022-04-25 DIAGNOSIS — N183 Chronic kidney disease, stage 3 unspecified: Secondary | ICD-10-CM | POA: Diagnosis not present

## 2022-04-25 DIAGNOSIS — Z7982 Long term (current) use of aspirin: Secondary | ICD-10-CM | POA: Insufficient documentation

## 2022-04-25 DIAGNOSIS — I5022 Chronic systolic (congestive) heart failure: Secondary | ICD-10-CM | POA: Insufficient documentation

## 2022-04-25 DIAGNOSIS — Z7984 Long term (current) use of oral hypoglycemic drugs: Secondary | ICD-10-CM | POA: Diagnosis not present

## 2022-04-25 LAB — BASIC METABOLIC PANEL
Anion gap: 8 (ref 5–15)
BUN: 14 mg/dL (ref 8–23)
CO2: 22 mmol/L (ref 22–32)
Calcium: 8.5 mg/dL — ABNORMAL LOW (ref 8.9–10.3)
Chloride: 112 mmol/L — ABNORMAL HIGH (ref 98–111)
Creatinine, Ser: 1.22 mg/dL (ref 0.61–1.24)
GFR, Estimated: 60 mL/min — ABNORMAL LOW (ref >60)
Glucose, Bld: 85 mg/dL (ref 70–99)
Potassium: 4 mmol/L (ref 3.5–5.1)
Sodium: 142 mmol/L (ref 135–145)

## 2022-04-25 MED ORDER — SACUBITRIL-VALSARTAN 49-51 MG PO TABS: 1.0000 | ORAL_TABLET | Freq: Two times a day (BID) | ORAL | 3 refills | Status: DC

## 2022-04-25 MED ORDER — FUROSEMIDE 40 MG PO TABS: 20.0000 mg | ORAL_TABLET | Freq: Every day | ORAL | 3 refills | Status: DC

## 2022-04-25 NOTE — Patient Instructions (Signed)
It was a pleasure seeing you today!  MEDICATIONS: -We are changing your medications today -Take Lasix (furosemide) 40 mg (1 tablet) for 2 days then take 20 mg (1/2 tab) daily -Increase Entresto to 49/51 mg (1 tablet) twice daily. You may take 2 tablets of the 24/26 mg strength twice daily until you receive the new strength.  -Call if you have questions about your medications.  LABS: -We will call you if your labs need attention.  NEXT APPOINTMENT: Return to clinic in 5 weeks with [facility].  In general, to take care of your heart failure: -Limit your fluid intake to 2 Liters (half-gallon) per day.   -Limit your salt intake to ideally 2-3 grams (2000-3000 mg) per day. -Weigh yourself daily and record, and bring that "weight diary" to your next appointment.  (Weight gain of 2-3 pounds in 1 day typically means fluid weight.) -The medications for your heart are to help your heart and help you live longer.   -Please contact us before stopping any of your heart medications.  Call the clinic at [phone] with questions or to reschedule future appointments.

## 2022-04-26 ENCOUNTER — Other Ambulatory Visit (HOSPITAL_COMMUNITY): Payer: Self-pay | Admitting: Pharmacist

## 2022-04-26 MED ORDER — CARVEDILOL 3.125 MG PO TABS: 3.1250 mg | ORAL_TABLET | Freq: Two times a day (BID) | ORAL | 3 refills | Status: DC

## 2022-05-30 ENCOUNTER — Encounter (HOSPITAL_COMMUNITY): Payer: No Typology Code available for payment source

## 2022-05-31 ENCOUNTER — Encounter (HOSPITAL_COMMUNITY): Payer: Self-pay

## 2022-05-31 ENCOUNTER — Ambulatory Visit (HOSPITAL_COMMUNITY)
Admission: RE | Admit: 2022-05-31 | Discharge: 2022-05-31 | Disposition: A | Discharge status: Home / Self Care | Payer: No Typology Code available for payment source | Source: Ambulatory Visit | Attending: Cardiology | Admitting: Cardiology

## 2022-05-31 ENCOUNTER — Telehealth (HOSPITAL_COMMUNITY): Payer: Self-pay

## 2022-05-31 VITALS — BP 128/72 | HR 54 | Wt 184.0 lb

## 2022-05-31 DIAGNOSIS — Z8719 Personal history of other diseases of the digestive system: Secondary | ICD-10-CM | POA: Diagnosis not present

## 2022-05-31 DIAGNOSIS — I5022 Chronic systolic (congestive) heart failure: Secondary | ICD-10-CM | POA: Diagnosis not present

## 2022-05-31 DIAGNOSIS — Z7984 Long term (current) use of oral hypoglycemic drugs: Secondary | ICD-10-CM | POA: Insufficient documentation

## 2022-05-31 DIAGNOSIS — I130 Hypertensive heart and chronic kidney disease with heart failure and stage 1 through stage 4 chronic kidney disease, or unspecified chronic kidney disease: Secondary | ICD-10-CM | POA: Diagnosis not present

## 2022-05-31 DIAGNOSIS — G8929 Other chronic pain: Secondary | ICD-10-CM | POA: Insufficient documentation

## 2022-05-31 DIAGNOSIS — Z955 Presence of coronary angioplasty implant and graft: Secondary | ICD-10-CM | POA: Diagnosis not present

## 2022-05-31 DIAGNOSIS — R0789 Other chest pain: Secondary | ICD-10-CM | POA: Diagnosis not present

## 2022-05-31 DIAGNOSIS — N183 Chronic kidney disease, stage 3 unspecified: Secondary | ICD-10-CM | POA: Diagnosis not present

## 2022-05-31 DIAGNOSIS — E1122 Type 2 diabetes mellitus with diabetic chronic kidney disease: Secondary | ICD-10-CM | POA: Diagnosis not present

## 2022-05-31 DIAGNOSIS — Z833 Family history of diabetes mellitus: Secondary | ICD-10-CM | POA: Diagnosis not present

## 2022-05-31 DIAGNOSIS — I2510 Atherosclerotic heart disease of native coronary artery without angina pectoris: Secondary | ICD-10-CM | POA: Diagnosis not present

## 2022-05-31 DIAGNOSIS — Z952 Presence of prosthetic heart valve: Secondary | ICD-10-CM | POA: Diagnosis not present

## 2022-05-31 DIAGNOSIS — I350 Nonrheumatic aortic (valve) stenosis: Secondary | ICD-10-CM | POA: Diagnosis not present

## 2022-05-31 DIAGNOSIS — Z8616 Personal history of COVID-19: Secondary | ICD-10-CM | POA: Insufficient documentation

## 2022-05-31 DIAGNOSIS — I447 Left bundle-branch block, unspecified: Secondary | ICD-10-CM | POA: Diagnosis not present

## 2022-05-31 DIAGNOSIS — Z8249 Family history of ischemic heart disease and other diseases of the circulatory system: Secondary | ICD-10-CM | POA: Insufficient documentation

## 2022-05-31 DIAGNOSIS — I5032 Chronic diastolic (congestive) heart failure: Secondary | ICD-10-CM

## 2022-05-31 DIAGNOSIS — Z7982 Long term (current) use of aspirin: Secondary | ICD-10-CM | POA: Diagnosis not present

## 2022-05-31 DIAGNOSIS — Z79899 Other long term (current) drug therapy: Secondary | ICD-10-CM | POA: Diagnosis not present

## 2022-05-31 LAB — BASIC METABOLIC PANEL
Anion gap: 11 (ref 5–15)
BUN: 21 mg/dL (ref 8–23)
CO2: 22 mmol/L (ref 22–32)
Calcium: 9 mg/dL (ref 8.9–10.3)
Chloride: 109 mmol/L (ref 98–111)
Creatinine, Ser: 1.42 mg/dL — ABNORMAL HIGH (ref 0.61–1.24)
GFR, Estimated: 50 mL/min — ABNORMAL LOW (ref >60)
Glucose, Bld: 108 mg/dL — ABNORMAL HIGH (ref 70–99)
Potassium: 4.2 mmol/L (ref 3.5–5.1)
Sodium: 142 mmol/L (ref 135–145)

## 2022-05-31 LAB — BRAIN NATRIURETIC PEPTIDE: B Natriuretic Peptide: 585.1 pg/mL — ABNORMAL HIGH (ref 0.0–100.0)

## 2022-05-31 MED ORDER — ENTRESTO 97-103 MG PO TABS: 1.0000 | ORAL_TABLET | Freq: Two times a day (BID) | ORAL | 11 refills | Status: DC

## 2022-05-31 NOTE — Patient Instructions (Signed)
It was great to see you today! No medication changes are needed at this time.  Labs today We will only contact you if something comes back abnormal or we need to make some changes. Otherwise no news is good news!  Your physician recommends that you schedule a follow-up appointment in: 3-4 weeks with the pharmacy team and in 2-3 months with Dr Aundra Dubin and echo   Your physician has requested that you have an echocardiogram. Echocardiography is a painless test that uses sound waves to create images of your heart. It provides your doctor with information about the size and shape of your heart and how well your heart's chambers and valves are working. This procedure takes approximately one hour. There are no restrictions for this procedure.   Do the following things EVERYDAY: Weigh yourself in the morning before breakfast. Write it down and keep it in a log. Take your medicines as prescribed Eat low salt foods--Limit salt (sodium) to 2000 mg per day.  Stay as active as you can everyday Limit all fluids for the day to less than 2 liters   At the [facility], you and your health needs are our priority. As part of our continuing mission to provide you with exceptional heart care, we have created designated Provider Care Teams. These Care Teams include your primary Cardiologist (physician) and Advanced Practice Providers (APPs- Physician Assistants and Nurse Practitioners) who all work together to provide you with the care you need, when you need it.   You may see any of the following providers on your designated Care Team at your next follow up: Dr Glori Bickers Dr Loralie Champagne Dr. Roxana Hires, NP Lyda Jester, [place] [facility] [place], [place] Forestine Na, NP Audry Riles, PharmD   Please be sure to bring in all your medications bottles to every appointment.    If you have any questions or concerns before your next appointment please send [place]  a message through [place] or call our office at [phone].    TO LEAVE A MESSAGE FOR THE NURSE SELECT OPTION 2, PLEASE LEAVE A MESSAGE INCLUDING: YOUR NAME DATE OF BIRTH CALL BACK NUMBER REASON FOR CALL**this is important as we prioritize the call backs  YOU WILL RECEIVE A CALL BACK THE SAME DAY AS LONG AS YOU CALL BEFORE 4:00 PM

## 2022-05-31 NOTE — Progress Notes (Signed)
Advanced Heart Failure Clinic Progress Note   PCP: Myrlene Broker, MD Cardiology: Dr. Bettina Gavia HF Cardiology: Dr. Aundra Dubin  81 y.o. with history of CAD, aortic stenosis s/p TAVR, and systolic CHF was self-referred to CHF clinic for evaluation of cardiomyopathy.  Patient was found to have severe AS in 2021.  Pre-TAVR evaluation showed severe proximal LAD stenosis treated by DES in 4/21.  He had TAVR in 5/21 with 29 mm Medtronic Evolut THV.  Echo in 5/22 showed EF 50-55%, normal TAVR valve.   He developed gradually worsening exertional dyspnea over the last 2 months.  He especially noted dyspnea walking up a hill or incline.  He has also had on and off "soreness" in his chest.  This is not clearly exertional.  He developed orthopnea.  He was admitted in 5/23 at [facility] with CHF.  He was incidentally found to have COVID-19.  Echo showed EF down to 30-35% with normal TAVR valve function.  He was diuresed and had a right thoracentesis.  He felt much better by the time of discharge.    RHC/LHC was done in 6/23; filling pressures were optimized and CO was normal, moderate disease in OM2 did not explain fall in EF.    Had visit 04/04/22 and SGLT2i added. Lasix discontinued. Had return f/u w/ pharmD 7/26 and was noted to be volume overloaded. Wt up 13 lb. Lasix restarted 20 mg daily. Entresto increased.   Presents back today for f/u. Doing well. Volume improved. Wt back down to original baseline, down 13 lb. Denies resting dyspnea. No orthopnea/ PND. NYHA Class II. Denies CP. Reports full med compliance. Tolerating well w/o side effects. BP 128/72.    ECG: not performed today   Labs (5/23): K 4.2, creatinine 1.5, LDL 71 Labs (6/23): K 5.6, creatinine 1.5 Labs (7/23): K 4.0, creatinine 1.22   PMH:  1. Barrett's esophagus 2. Chronic back pain 3. HTN 4. Type 2 diabetes 5. Contrast allergy 6. CKD stage 3 7. Aortic stenosis: Severe AS, now s/p TAVR in 5/21 with 29 mm Medtronic Evolut Pro.   8. CAD: 4/21 PCI to proximal LAD (pre-TAVR).  - LHC (6/23): 70% OM2, 50% ostial RCA 9. Carotid stenosis: Carotid dopplers (5/21) with 50-69% RICA stenosis.  10. COVID-19 5/23 11. Chronic systolic CHF: Cause uncertain.   - Echo (5/22): EF 50-55%, normal RV, normal bioprosthetic AoV s/p TAVR mean gradient 10 mmHg - Echo (5/23): EF 30-35%, normal RV, normal bioprosthetic AoV s/p TAVR mean gradient 7 mmHg - RHC (6/23): mean RA 2, PA 27/7, mean PCWP 5, CI 3.01 12. LBBB  SH: Lives in [place], nonsmoker, no ETOH. Married.   Family History  Problem Relation Age of Onset   Diabetes Mother    Heart disease Father    Hypertension Father    ROS: All systems reviewed and negative except as per HPI.   Current Outpatient Medications  Medication Sig Dispense Refill   acetaminophen (TYLENOL) 500 MG tablet Take 1,000 mg by mouth every 6 (six) hours as needed for moderate pain (Back pain).     aspirin EC 81 MG tablet Take 81 mg by mouth daily.     atorvastatin (LIPITOR) 40 MG tablet Take 1 tablet (40 mg total) by mouth daily. 30 tablet 3   carvedilol (COREG) 3.125 MG tablet Take 1 tablet (3.125 mg total) by mouth 2 (two) times daily. 180 tablet 3   colchicine 0.6 MG tablet Take 2 tablets by mouth as needed. For gout attacks  empagliflozin (JARDIANCE) 25 MG TABS tablet Take 12.5 mg (1/2 tablet) daily. 45 tablet 3   Fluocinolone Acetonide 0.01 % OIL Place 1 drop in ear(s) daily as needed (itching).     furosemide (LASIX) 40 MG tablet Take 0.5 tablets (20 mg total) by mouth daily. 45 tablet 3   glipiZIDE (GLUCOTROL XL) 5 MG 24 hr tablet Take 5 mg by mouth in the morning and at bedtime.     Magnesium Oxide 400 MG CAPS Take 1 capsule (400 mg total) by mouth 2 (two) times daily. 60 capsule 6   metFORMIN (GLUCOPHAGE) 1000 MG tablet Take 1,000 mg by mouth 2 (two) times daily with a meal.     Multiple Vitamins-Minerals (PRESERVISION AREDS 2+MULTI VIT) CAPS Take 1 capsule by mouth daily.     pantoprazole  (PROTONIX) 40 MG tablet Take 1 tablet (40 mg total) by mouth daily. 90 tablet 3   potassium chloride SA (KLOR-CON M) 20 MEQ tablet Take 1 tablet (20 mEq total) by mouth daily. 90 tablet 3   sacubitril-valsartan (ENTRESTO) 49-51 MG Take 1 tablet by mouth 2 (two) times daily. 180 tablet 3   No current facility-administered medications for this encounter.   BP 128/72   Pulse (!) 54   Wt 83.5 kg (184 lb)   SpO2 97%   BMI 28.82 kg/m  PHYSICAL EXAM: General:  Well appearing. No respiratory difficulty HEENT: normal Neck: supple. no JVD. Carotids 2+ bilat; no bruits. No lymphadenopathy or thyromegaly appreciated. Cor: PMI nondisplaced. Regular rate & rhythm. No rubs, gallops or murmurs. Lungs: clear Abdomen: soft, nontender, nondistended. No hepatosplenomegaly. No bruits or masses. Good bowel sounds. Extremities: no cyanosis, clubbing, rash, edema Neuro: alert & oriented x 3, cranial nerves grossly intact. moves all 4 extremities w/o difficulty. Affect pleasant.  Assessment/Plan: 1. Chronic systolic CHF: Echo in 1/47 with EF 30-35%, normally-functioning bioprosthetic AoV and normal RV.  Most recent prior echo from 5/22 showed EF 50-55%.  Fall in EF was associated with CHF exacerbation and atypical chest pain.  He also has LBBB that is new since 3/22.  RHC/LHC was done in 6/23; filling pressures were optimized and CO was normal, moderate disease in OM2 did not explain fall in EF. Euvolemic on exam. NYHA Class II (stable)  - Continue Coreg 3.125 mg bid.  - Increase Entresto to 97-103 mg bid. - Continue Jardiance 12.5 mg daily  - Continue Lasix 20 mg daily  - Check BMP and BNP today   - consider MRA next pending renal fx and K  - Repeat echo in the fall to reassess EF.  With advanced age, probably would not recommend ICD unless ventricular arrhythmias noted.  - LBBB is not markedly wide, only 130 msec today.  Does not appear wide enough that he would benefit from CRT.  2. CAD: PCI to proximal  LAD pre-TAVR in 4/21, not associated with ACS.  Cath in 6/23 showed 70% OM2 stenosis, medically managed. Stable w/o CP   - Continue ASA 81 - Continue atorvastatin 40 daily, 5/23 lipids acceptable.  3. CKD: Stage 3.   - check BMET today.  4. Aortic stenosis: S/p TAVR with Medtronic Evolut valve, valve was functioning normally on 5/23 echo.   Followup in 3 wks with HF pharmacist for medication titration. Dr. Aundra Dubin in 8 wks w/ repeat echo     Lyda Jester, PA-C  05/31/2022

## 2022-05-31 NOTE — Telephone Encounter (Addendum)
Pt aware, agreeable, and verbalized understanding   ----- Message from Consuelo Pandy, PA-C sent at 05/31/2022  1:34 PM EDT ----- Labs stable. Increase Entresto to 97-103 mg bid. Repeat BMP in 1 wk

## 2022-06-01 ENCOUNTER — Other Ambulatory Visit (HOSPITAL_COMMUNITY): Payer: Self-pay

## 2022-06-01 ENCOUNTER — Telehealth (HOSPITAL_COMMUNITY): Payer: Self-pay | Admitting: Pharmacy Technician

## 2022-06-01 NOTE — Telephone Encounter (Signed)
Patient Advocate Encounter   Received notification from [place] that prior authorization for Delene Loll is required.   PA submitted on CoverMyMeds Key  [id number] Status is pending   Will continue to follow.

## 2022-06-07 ENCOUNTER — Ambulatory Visit (HOSPITAL_COMMUNITY)
Admission: RE | Admit: 2022-06-07 | Discharge: 2022-06-07 | Disposition: A | Discharge status: Home / Self Care | Payer: No Typology Code available for payment source | Source: Ambulatory Visit | Attending: Internal Medicine | Admitting: Internal Medicine

## 2022-06-07 DIAGNOSIS — I5032 Chronic diastolic (congestive) heart failure: Secondary | ICD-10-CM | POA: Diagnosis present

## 2022-06-07 LAB — BASIC METABOLIC PANEL
Anion gap: 7 (ref 5–15)
BUN: 20 mg/dL (ref 8–23)
CO2: 23 mmol/L (ref 22–32)
Calcium: 8.5 mg/dL — ABNORMAL LOW (ref 8.9–10.3)
Chloride: 108 mmol/L (ref 98–111)
Creatinine, Ser: 1.43 mg/dL — ABNORMAL HIGH (ref 0.61–1.24)
GFR, Estimated: 50 mL/min — ABNORMAL LOW (ref >60)
Glucose, Bld: 121 mg/dL — ABNORMAL HIGH (ref 70–99)
Potassium: 3.9 mmol/L (ref 3.5–5.1)
Sodium: 138 mmol/L (ref 135–145)

## 2022-06-08 NOTE — Progress Notes (Signed)
Advanced Heart Failure Clinic Note   PCP: Myrlene Broker, MD Cardiology: Dr. Bettina Gavia HF Cardiology: Dr. Aundra Dubin  HPI:  81 y.o. with history of CAD, aortic stenosis s/p TAVR, and systolic CHF was self-referred to CHF clinic for evaluation of cardiomyopathy.  Patient was found to have severe AS in 2021.  Pre-TAVR evaluation showed severe proximal LAD stenosis treated by DES in 12/2019.  He had TAVR in 01/2020 with 29 mm Medtronic Evolut THV.  Echo in 01/2021 showed EF 50-55%, normal TAVR valve.    He developed gradually worsening exertional dyspnea over the last 2 months.  He especially noted dyspnea walking up a hill or incline.  He has also had on and off "soreness" in his chest.  This is not clearly exertional.  He developed orthopnea.  He was admitted in 01/2022 at [facility] with CHF.  He was incidentally found to have COVID-19.  Echo showed EF down to 30-35% with normal TAVR valve function.  He was diuresed and had a right thoracentesis.  He felt much better by the time of discharge.     RHC/LHC was done in 03/2022; filling pressures were optimized and CO was normal, moderate disease in OM2 did not explain fall in EF.     He returned to [facility] for followup of CHF on 05/31/22. Reported  doing well. Volume had improved. Weight was back down to original baseline, down 13 lbs. Denied resting dyspnea. No orthopnea/ PND. NYHA Class II. Denied CP. Reported full med compliance. Had been tolerating medications well w/o side effects. BP 128/72.   Today he returns to HF clinic for pharmacist medication titration. At last visit with APP, Entresto was increased to 97/103 mg BID. Overall he is feeling well today. States he feels good as long as he eats regular meals. Does get dizzy when his BG drops. No CP or palpitations. Dyspnea much improved now that he is euvolemic. Walks in the morning and evening (uphill on the way back) without getting SOB. Weight at home has been stable at 185 lbs. Takes  Lasix 20 mg daily and has not needed any extra. No LEE, PND or orthopnea. Taking all medications as prescribed and tolerating all medications.    HF Medications: Carvedilol 3.125 mg BID Entresto 97/103 mg BID Jardiance 12.5 mg daily Lasix 20 mg daily  Has the patient been experiencing any side effects to the medications prescribed?  no  Does the patient have any problems obtaining medications due to transportation or finances?   No - receives medications from the [place].  Understanding of regimen: fair Understanding of indications: fair Potential of compliance: good - his wife helps him manage his medications.  Patient understands to avoid NSAIDs. Patient understands to avoid decongestants.    Pertinent Lab Values: 06/07/22: Serum creatinine 1.43, BUN 20, Potassium 3.9, Sodium 138 BMET today pending  Vital Signs:  Weight: 185.8 lbs (last clinic weight: 184 lbs) Blood pressure: 116/62 Heart rate: 55   Assessment/Plan: 1. Chronic systolic CHF: Echo in 10/256 with EF 30-35%, normally-functioning bioprosthetic AoV and normal RV.  Most recent prior echo from 01/2021 showed EF 50-55%.  Fall in EF was associated with CHF exacerbation and atypical chest pain.  He also has LBBB that is new since 11/2020.  RHC/LHC was done in 03/2022; filling pressures were optimized and CO was normal, moderate disease in OM2 did not explain fall in EF.    - NYHA class II symptoms. Euvolemic on exam.  - BMET today  pending - Continue Lasix 20 mg daily.  - Continue carvedilol 3.125 mg BID.  - Continue Entresto 97/103 mg BID. - Start spironolactone 12.5 mg daily. Repeat BMET in 1 week.  - Continue Jardiance 12.5 mg daily (VA only carries 25 mg tablets) - LBBB is not markedly wide.  Does not appear wide enough that he would benefit from CRT.  - Repeat echo 08/2022 to reassess EF.  With advanced age, probably would not recommend ICD unless ventricular arrhythmias noted.  2. CAD: PCI to proximal LAD pre-TAVR in  12/2019, not associated with ACS.  Cath in 03/2022 showed 70% OM2 stenosis, medically managed.  - Continue ASA 81 mg daily - Continue atorvastatin 40 mg daily, 01/2022 lipids acceptable.  3. CKD: Stage 3.  4. Aortic stenosis: S/p TAVR with Medtronic Evolut valve, valve was functioning normally on 01/2022 echo.  5. T2DM -A1C 6.4% 05/2022. Notes dizziness when BG drops and he hasn't eaten enough. I do not think he needs glipizide when he is now also taking Jardiance and metformin. - Stop glipizide. Continue Jardiance and metformin. Consider increasing Jardiance to 25 mg daily next visit. Repeat A1C in 3 months.   Follow up 2 months with Dr. Aundra Dubin.    Andre Jimenez, PharmD, BCPS, BCCP, CPP Heart Failure Clinic Pharmacist [phone]

## 2022-06-14 NOTE — Telephone Encounter (Signed)
Advanced Heart Failure Patient Advocate Encounter  PA denied. That insurance was for his part B medications. This particular medication is not covered under part B benefits.  No further action needed at this time.  Charlann Boxer, CPhT

## 2022-06-21 ENCOUNTER — Ambulatory Visit (HOSPITAL_COMMUNITY)
Admission: RE | Admit: 2022-06-21 | Discharge: 2022-06-21 | Disposition: A | Discharge status: Home / Self Care | Payer: No Typology Code available for payment source | Source: Ambulatory Visit | Attending: Internal Medicine | Admitting: Internal Medicine

## 2022-06-21 VITALS — BP 116/62 | HR 55 | Wt 185.8 lb

## 2022-06-21 DIAGNOSIS — I350 Nonrheumatic aortic (valve) stenosis: Secondary | ICD-10-CM | POA: Insufficient documentation

## 2022-06-21 DIAGNOSIS — Z8616 Personal history of COVID-19: Secondary | ICD-10-CM | POA: Insufficient documentation

## 2022-06-21 DIAGNOSIS — Z79899 Other long term (current) drug therapy: Secondary | ICD-10-CM | POA: Diagnosis not present

## 2022-06-21 DIAGNOSIS — Z952 Presence of prosthetic heart valve: Secondary | ICD-10-CM | POA: Diagnosis not present

## 2022-06-21 DIAGNOSIS — Z955 Presence of coronary angioplasty implant and graft: Secondary | ICD-10-CM | POA: Insufficient documentation

## 2022-06-21 DIAGNOSIS — I5022 Chronic systolic (congestive) heart failure: Secondary | ICD-10-CM | POA: Insufficient documentation

## 2022-06-21 DIAGNOSIS — Z7982 Long term (current) use of aspirin: Secondary | ICD-10-CM | POA: Insufficient documentation

## 2022-06-21 DIAGNOSIS — N183 Chronic kidney disease, stage 3 unspecified: Secondary | ICD-10-CM | POA: Diagnosis not present

## 2022-06-21 DIAGNOSIS — I447 Left bundle-branch block, unspecified: Secondary | ICD-10-CM | POA: Insufficient documentation

## 2022-06-21 DIAGNOSIS — I2510 Atherosclerotic heart disease of native coronary artery without angina pectoris: Secondary | ICD-10-CM | POA: Insufficient documentation

## 2022-06-21 DIAGNOSIS — E1122 Type 2 diabetes mellitus with diabetic chronic kidney disease: Secondary | ICD-10-CM | POA: Insufficient documentation

## 2022-06-21 DIAGNOSIS — Z7984 Long term (current) use of oral hypoglycemic drugs: Secondary | ICD-10-CM | POA: Diagnosis not present

## 2022-06-21 LAB — BASIC METABOLIC PANEL
Anion gap: 10 (ref 5–15)
BUN: 17 mg/dL (ref 8–23)
CO2: 25 mmol/L (ref 22–32)
Calcium: 8.6 mg/dL — ABNORMAL LOW (ref 8.9–10.3)
Chloride: 107 mmol/L (ref 98–111)
Creatinine, Ser: 1.2 mg/dL (ref 0.61–1.24)
GFR, Estimated: >60 mL/min (ref >60)
Glucose, Bld: 104 mg/dL — ABNORMAL HIGH (ref 70–99)
Potassium: 3.8 mmol/L (ref 3.5–5.1)
Sodium: 142 mmol/L (ref 135–145)

## 2022-06-21 MED ORDER — ENTRESTO 97-103 MG PO TABS: 1.0000 | ORAL_TABLET | Freq: Two times a day (BID) | ORAL | 3 refills | Status: DC

## 2022-06-21 MED ORDER — SPIRONOLACTONE 25 MG PO TABS: 12.5000 mg | ORAL_TABLET | Freq: Every day | ORAL | 3 refills | Status: DC

## 2022-06-21 NOTE — Patient Instructions (Signed)
It was a pleasure seeing you today!  MEDICATIONS: -We are changing your medications today -Stop glipizide  -Start spironolactone 12.5 mg (1/2 tablet) daily -Call if you have questions about your medications.  LABS: -We will call you if your labs need attention.  NEXT APPOINTMENT: Return to clinic in 3 months with Dr. Aundra Dubin.  In general, to take care of your heart failure: -Limit your fluid intake to 2 Liters (half-gallon) per day.   -Limit your salt intake to ideally 2-3 grams (2000-3000 mg) per day. -Weigh yourself daily and record, and bring that "weight diary" to your next appointment.  (Weight gain of 2-3 pounds in 1 day typically means fluid weight.) -The medications for your heart are to help your heart and help you live longer.   -Please contact us before stopping any of your heart medications.  Call the clinic at [phone] with questions or to reschedule future appointments.

## 2022-06-28 ENCOUNTER — Ambulatory Visit (HOSPITAL_COMMUNITY)
Admission: RE | Admit: 2022-06-28 | Discharge: 2022-06-28 | Disposition: A | Discharge status: Home / Self Care | Payer: No Typology Code available for payment source | Source: Ambulatory Visit | Attending: Cardiology | Admitting: Cardiology

## 2022-06-28 DIAGNOSIS — I5022 Chronic systolic (congestive) heart failure: Secondary | ICD-10-CM | POA: Diagnosis not present

## 2022-06-28 LAB — BASIC METABOLIC PANEL
Anion gap: 10 (ref 5–15)
BUN: 18 mg/dL (ref 8–23)
CO2: 25 mmol/L (ref 22–32)
Calcium: 8.6 mg/dL — ABNORMAL LOW (ref 8.9–10.3)
Chloride: 106 mmol/L (ref 98–111)
Creatinine, Ser: 1.34 mg/dL — ABNORMAL HIGH (ref 0.61–1.24)
GFR, Estimated: 54 mL/min — ABNORMAL LOW (ref >60)
Glucose, Bld: 147 mg/dL — ABNORMAL HIGH (ref 70–99)
Potassium: 4.6 mmol/L (ref 3.5–5.1)
Sodium: 141 mmol/L (ref 135–145)

## 2022-07-26 ENCOUNTER — Ambulatory Visit: Payer: No Typology Code available for payment source | Admitting: Cardiology

## 2022-09-03 ENCOUNTER — Ambulatory Visit (HOSPITAL_COMMUNITY)
Admission: RE | Admit: 2022-09-03 | Discharge: 2022-09-03 | Disposition: A | Discharge status: Home / Self Care | Payer: Medicare Other | Source: Ambulatory Visit | Attending: Family Medicine | Admitting: Family Medicine

## 2022-09-03 ENCOUNTER — Ambulatory Visit (HOSPITAL_BASED_OUTPATIENT_CLINIC_OR_DEPARTMENT_OTHER)
Admission: RE | Admit: 2022-09-03 | Discharge: 2022-09-03 | Disposition: A | Discharge status: Home / Self Care | Payer: Medicare Other | Source: Ambulatory Visit | Attending: Cardiology | Admitting: Cardiology

## 2022-09-03 ENCOUNTER — Encounter (HOSPITAL_COMMUNITY): Payer: Self-pay | Admitting: Cardiology

## 2022-09-03 VITALS — BP 110/70 | HR 50 | Wt 184.8 lb

## 2022-09-03 DIAGNOSIS — N183 Chronic kidney disease, stage 3 unspecified: Secondary | ICD-10-CM | POA: Diagnosis not present

## 2022-09-03 DIAGNOSIS — I5022 Chronic systolic (congestive) heart failure: Secondary | ICD-10-CM | POA: Diagnosis not present

## 2022-09-03 DIAGNOSIS — E1122 Type 2 diabetes mellitus with diabetic chronic kidney disease: Secondary | ICD-10-CM | POA: Diagnosis not present

## 2022-09-03 DIAGNOSIS — R0789 Other chest pain: Secondary | ICD-10-CM | POA: Diagnosis not present

## 2022-09-03 DIAGNOSIS — I2510 Atherosclerotic heart disease of native coronary artery without angina pectoris: Secondary | ICD-10-CM

## 2022-09-03 DIAGNOSIS — I350 Nonrheumatic aortic (valve) stenosis: Secondary | ICD-10-CM | POA: Insufficient documentation

## 2022-09-03 DIAGNOSIS — Z955 Presence of coronary angioplasty implant and graft: Secondary | ICD-10-CM | POA: Insufficient documentation

## 2022-09-03 DIAGNOSIS — I130 Hypertensive heart and chronic kidney disease with heart failure and stage 1 through stage 4 chronic kidney disease, or unspecified chronic kidney disease: Secondary | ICD-10-CM | POA: Diagnosis not present

## 2022-09-03 DIAGNOSIS — I447 Left bundle-branch block, unspecified: Secondary | ICD-10-CM | POA: Diagnosis not present

## 2022-09-03 DIAGNOSIS — Z9861 Coronary angioplasty status: Secondary | ICD-10-CM

## 2022-09-03 LAB — ECHOCARDIOGRAM COMPLETE
AR max vel: 2.85 cm2
AV Area VTI: 2.85 cm2
AV Area mean vel: 2.74 cm2
AV Mean grad: 10 mmHg
AV Peak grad: 14.9 mmHg
Ao pk vel: 1.93 m/s
Area-P 1/2: 1.75 cm2
Calc EF: 25.4 %
MV M vel: 2.37 m/s
MV Peak grad: 22.5 mmHg
MV VTI: 2.64 cm2
S' Lateral: 5.3 cm
Single Plane A2C EF: 33.3 %
Single Plane A4C EF: 16.5 %

## 2022-09-03 LAB — BRAIN NATRIURETIC PEPTIDE: B Natriuretic Peptide: 363.4 pg/mL — ABNORMAL HIGH (ref 0.0–100.0)

## 2022-09-03 MED ORDER — SPIRONOLACTONE 25 MG PO TABS: 25.0000 mg | ORAL_TABLET | Freq: Every day | ORAL | 3 refills | Status: DC

## 2022-09-03 NOTE — Patient Instructions (Signed)
INCREASE Spironolacotne to 25 mg ( 1tablet ) Daily.  STOP Potassium  Labs done today, your results will be available in MyChart, we will contact you for abnormal readings.  Repeat blood work in 10 days  Your physician has requested that you have an echocardiogram. Echocardiography is a painless test that uses sound waves to create images of your heart. It provides your doctor with information about the size and shape of your heart and how well your heart's chambers and valves are working. This procedure takes approximately one hour. There are no restrictions for this procedure. Please do NOT wear cologne, perfume, aftershave, or lotions (deodorant is allowed). Please arrive 15 minutes prior to your appointment time.  Your physician recommends that you schedule a follow-up appointment in: 3 months with the nurse Practitioner and 6 months with an echocardiogram ( June 2024)  ** please call the office in March to arrange your follow up appointment **   If you have any questions or concerns before your next appointment please send [place] a message through [place] or call our office at [phone].    TO LEAVE A MESSAGE FOR THE NURSE SELECT OPTION 2, PLEASE LEAVE A MESSAGE INCLUDING: YOUR NAME DATE OF BIRTH CALL BACK NUMBER REASON FOR CALL**this is important as we prioritize the call backs  YOU WILL RECEIVE A CALL BACK THE SAME DAY AS LONG AS YOU CALL BEFORE 4:00 PM  At the [facility], you and your health needs are our priority. As part of our continuing mission to provide you with exceptional heart care, we have created designated Provider Care Teams. These Care Teams include your primary Cardiologist (physician) and Advanced Practice Providers (APPs- Physician Assistants and Nurse Practitioners) who all work together to provide you with the care you need, when you need it.   You may see any of the following providers on your designated Care Team at your next follow up: Dr  Glori Bickers Dr Loralie Champagne Dr. Roxana Hires, NP Lyda Jester, [place] [facility] [place], [place] Forestine Na, NP Audry Riles, PharmD   Please be sure to bring in all your medications bottles to every appointment.

## 2022-09-03 NOTE — Progress Notes (Signed)
  Echocardiogram 2D Echocardiogram has been performed.  Andre Jimenez 09/03/2022, 11:10 AM

## 2022-09-03 NOTE — Progress Notes (Signed)
PCP: Myrlene Broker, MD Cardiology: Dr. Bettina Gavia HF Cardiology: Dr. Aundra Dubin  81 y.o. with history of CAD, aortic stenosis s/p TAVR, and systolic CHF was self-referred to CHF clinic for evaluation of cardiomyopathy.  Patient was found to have severe AS in 2021.  Pre-TAVR evaluation showed severe proximal LAD stenosis treated by DES in 4/21.  He had TAVR in 5/21 with 29 mm Medtronic Evolut THV.  Echo in 5/22 showed EF 50-55%, normal TAVR valve.   He developed gradually worsening exertional dyspnea over the last 2 months.  He especially noted dyspnea walking up a hill or incline.  He has also had on and off "soreness" in his chest.  This is not clearly exertional.  He developed orthopnea.  He was admitted in 5/23 at [facility] with CHF.  He was incidentally found to have COVID-19.  Echo showed EF down to 30-35% with normal TAVR valve function.  He was diuresed and had a right thoracentesis.  He felt much better by the time of discharge.    RHC/LHC was done in 6/23; filling pressures were optimized and CO was normal, moderate disease in OM2 did not explain fall in EF.   Echo was done today and reviewed, EF 30% with global hypokinesis, normal RV, normal bioprosthetic aortic valve with mean gradient 10 and no AI (s/p TAVR), IVC normal.   He returns today for followup of CHF.  He is symptomatically going well.  Notes dyspnea when he chainsaws.  He has been deer hunting, climbing up and down tree stand and walking through the woods.  No dyspnea doing this. No orthopnea/PND.  No chest pain.  No lightheadedness.  No palpitations. Weight stable.   ECG (personally reviewed): NSR, LBBB 138 msec msec   Labs (5/23): K 4.2, creatinine 1.5, LDL 71 Labs (6/23): K 5.6, creatinine 1.5 Labs (9/23): K 4.6, creatinine 1.34  PMH:  1. Barrett's esophagus 2. Chronic back pain 3. HTN 4. Type 2 diabetes 5. Contrast allergy 6. CKD stage 3 7. Aortic stenosis: Severe AS, now s/p TAVR in 5/21 with 29 mm Medtronic  Evolut Pro.  8. CAD: 4/21 PCI to proximal LAD (pre-TAVR).  - LHC (6/23): 70% OM2, 50% ostial RCA 9. Carotid stenosis: Carotid dopplers (5/21) with 50-69% RICA stenosis.  10. COVID-19 5/23 11. Chronic systolic CHF: Cause uncertain.   - Echo (5/22): EF 50-55%, normal RV, normal bioprosthetic AoV s/p TAVR mean gradient 10 mmHg - Echo (5/23): EF 30-35%, normal RV, normal bioprosthetic AoV s/p TAVR mean gradient 7 mmHg - RHC (6/23): mean RA 2, PA 27/7, mean PCWP 5, CI 3.01 - Echo (12/23): EF 30% with global hypokinesis, normal RV, normal bioprosthetic aortic valve with mean gradient 10 and no AI (s/p TAVR), IVC normal 12. LBBB  SH: Lives in [place], nonsmoker, no ETOH. Married.   Family History  Problem Relation Age of Onset   Diabetes Mother    Heart disease Father    Hypertension Father    ROS: All systems reviewed and negative except as per HPI.   Current Outpatient Medications  Medication Sig Dispense Refill   acetaminophen (TYLENOL) 500 MG tablet Take 1,000 mg by mouth every 6 (six) hours as needed for moderate pain (Back pain).     aspirin EC 81 MG tablet Take 81 mg by mouth daily.     atorvastatin (LIPITOR) 40 MG tablet Take 1 tablet (40 mg total) by mouth daily. 30 tablet 3   carvedilol (COREG) 3.125 MG tablet Take 1 tablet (3.125  mg total) by mouth 2 (two) times daily. 180 tablet 3   colchicine 0.6 MG tablet Take 2 tablets by mouth as needed. For gout attacks     empagliflozin (JARDIANCE) 25 MG TABS tablet Take 12.5 mg (1/2 tablet) daily. 45 tablet 3   Fluocinolone Acetonide 0.01 % OIL Place 1 drop in ear(s) daily as needed (itching).     furosemide (LASIX) 40 MG tablet Take 0.5 tablets (20 mg total) by mouth daily. 45 tablet 3   Magnesium Oxide 400 MG CAPS Take 1 capsule (400 mg total) by mouth 2 (two) times daily. 60 capsule 6   metFORMIN (GLUCOPHAGE) 1000 MG tablet Take 1,000 mg by mouth 2 (two) times daily with a meal.     Multiple Vitamins-Minerals (PRESERVISION AREDS  2+MULTI VIT) CAPS Take 1 capsule by mouth daily.     pantoprazole (PROTONIX) 40 MG tablet Take 1 tablet (40 mg total) by mouth daily. 90 tablet 3   sacubitril-valsartan (ENTRESTO) 97-103 MG Take 1 tablet by mouth 2 (two) times daily. 180 tablet 3   spironolactone (ALDACTONE) 25 MG tablet Take 1 tablet (25 mg total) by mouth daily. 90 tablet 3   No current facility-administered medications for this encounter.   BP 110/70   Pulse (!) 50   Wt 83.8 kg (184 lb 12.8 oz)   SpO2 98%   BMI 28.94 kg/m  General: NAD Neck: No JVD, no thyromegaly or thyroid nodule.  Lungs: Clear to auscultation bilaterally with normal respiratory effort. CV: Nondisplaced PMI.  Heart regular S1/S2, no S3/S4, no murmur.  No peripheral edema.  No carotid bruit.  Normal pedal pulses.  Abdomen: Soft, nontender, no hepatosplenomegaly, no distention.  Skin: Intact without lesions or rashes.  Neurologic: Alert and oriented x 3.  Psych: Normal affect. Extremities: No clubbing or cyanosis.  HEENT: Normal.   Assessment/Plan: 1. Chronic systolic CHF: Echo in 2/13 with EF 30-35%, normally-functioning bioprosthetic AoV and normal RV.  Most recent prior echo from 5/22 showed EF 50-55%.  Fall in EF was associated with CHF exacerbation and atypical chest pain.  He also has LBBB that is new since 3/22.  RHC/LHC was done in 6/23; filling pressures were optimized and CO was normal, moderate disease in OM2 did not explain fall in EF.  Echo today showed EF 30% with global hypokinesis, normal RV, normal bioprosthetic aortic valve with mean gradient 10 and no AI (s/p TAVR), IVC normal.  On exam today, he is not volume overloaded.  NYHA class I-II symptoms.  - Continue Coreg 3.125 mg bid, would not increase with HR in 50s.  - Continue Entresto 97/103 bid. - Continue Farxiga 10 mg daily.  - Increase spironolactone to 25 mg daily, BMET today and in 10 days.  Stop KCl.  - LBBB is not markedly wide, 138 msec today.  Does not appear wide enough  that he would derive significant benefit from CRT.  - Continue medication titration and get echo again in 6 months.  2. CAD: PCI to proximal LAD pre-TAVR in 4/21, not associated with ACS.  Cath in 6/23 showed 70% OM2 stenosis, medically managed.  - Continue ASA 81 - Continue atorvastatin 40 daily, 5/23 lipids acceptable.  3. CKD: Stage 3.  BMET today.  4. Aortic stenosis: S/p TAVR with Medtronic Evolut valve, valve was functioning normally on 12/23 echo.   Followup in 3 months with APP, see me with repeat echo in 6 months.    Andre Jimenez 09/03/2022

## 2022-09-13 ENCOUNTER — Ambulatory Visit (HOSPITAL_COMMUNITY)
Admission: RE | Admit: 2022-09-13 | Discharge: 2022-09-13 | Disposition: A | Discharge status: Home / Self Care | Payer: Medicare Other | Source: Ambulatory Visit | Attending: Cardiology | Admitting: Cardiology

## 2022-09-13 DIAGNOSIS — I5022 Chronic systolic (congestive) heart failure: Secondary | ICD-10-CM | POA: Insufficient documentation

## 2022-09-13 LAB — BASIC METABOLIC PANEL
Anion gap: 10 (ref 5–15)
BUN: 19 mg/dL (ref 8–23)
CO2: 25 mmol/L (ref 22–32)
Calcium: 8.6 mg/dL — ABNORMAL LOW (ref 8.9–10.3)
Chloride: 105 mmol/L (ref 98–111)
Creatinine, Ser: 1.22 mg/dL (ref 0.61–1.24)
GFR, Estimated: 60 mL/min — ABNORMAL LOW (ref >60)
Glucose, Bld: 157 mg/dL — ABNORMAL HIGH (ref 70–99)
Potassium: 3.6 mmol/L (ref 3.5–5.1)
Sodium: 140 mmol/L (ref 135–145)

## 2022-10-23 ENCOUNTER — Telehealth (HOSPITAL_COMMUNITY): Payer: Self-pay | Admitting: *Deleted

## 2022-10-23 NOTE — Telephone Encounter (Signed)
Pts wife called to make sure pts community care referral had been approved.  Routed to [employer]

## 2022-10-24 NOTE — Telephone Encounter (Signed)
[facility] auth # [id number]

## 2022-10-29 ENCOUNTER — Other Ambulatory Visit (HOSPITAL_COMMUNITY): Payer: No Typology Code available for payment source

## 2022-12-06 ENCOUNTER — Ambulatory Visit (HOSPITAL_COMMUNITY)
Admission: RE | Admit: 2022-12-06 | Discharge: 2022-12-06 | Disposition: A | Discharge status: Home / Self Care | Payer: No Typology Code available for payment source | Source: Ambulatory Visit | Attending: Cardiology | Admitting: Cardiology

## 2022-12-06 ENCOUNTER — Encounter (HOSPITAL_COMMUNITY): Payer: Self-pay

## 2022-12-06 VITALS — BP 116/56 | HR 60 | Wt 185.4 lb

## 2022-12-06 DIAGNOSIS — I350 Nonrheumatic aortic (valve) stenosis: Secondary | ICD-10-CM | POA: Diagnosis not present

## 2022-12-06 DIAGNOSIS — E1122 Type 2 diabetes mellitus with diabetic chronic kidney disease: Secondary | ICD-10-CM | POA: Insufficient documentation

## 2022-12-06 DIAGNOSIS — Z955 Presence of coronary angioplasty implant and graft: Secondary | ICD-10-CM | POA: Insufficient documentation

## 2022-12-06 DIAGNOSIS — Z7982 Long term (current) use of aspirin: Secondary | ICD-10-CM | POA: Insufficient documentation

## 2022-12-06 DIAGNOSIS — I447 Left bundle-branch block, unspecified: Secondary | ICD-10-CM | POA: Diagnosis not present

## 2022-12-06 DIAGNOSIS — Z79899 Other long term (current) drug therapy: Secondary | ICD-10-CM | POA: Diagnosis not present

## 2022-12-06 DIAGNOSIS — N183 Chronic kidney disease, stage 3 unspecified: Secondary | ICD-10-CM | POA: Diagnosis not present

## 2022-12-06 DIAGNOSIS — I2510 Atherosclerotic heart disease of native coronary artery without angina pectoris: Secondary | ICD-10-CM | POA: Diagnosis not present

## 2022-12-06 DIAGNOSIS — I130 Hypertensive heart and chronic kidney disease with heart failure and stage 1 through stage 4 chronic kidney disease, or unspecified chronic kidney disease: Secondary | ICD-10-CM | POA: Insufficient documentation

## 2022-12-06 DIAGNOSIS — R0789 Other chest pain: Secondary | ICD-10-CM | POA: Diagnosis not present

## 2022-12-06 DIAGNOSIS — I5022 Chronic systolic (congestive) heart failure: Secondary | ICD-10-CM | POA: Insufficient documentation

## 2022-12-06 LAB — COMPREHENSIVE METABOLIC PANEL
ALT: 9 U/L (ref 0–44)
AST: 14 U/L — ABNORMAL LOW (ref 15–41)
Albumin: 3.6 g/dL (ref 3.5–5.0)
Alkaline Phosphatase: 83 U/L (ref 38–126)
Anion gap: 10 (ref 5–15)
BUN: 14 mg/dL (ref 8–23)
CO2: 25 mmol/L (ref 22–32)
Calcium: 8.8 mg/dL — ABNORMAL LOW (ref 8.9–10.3)
Chloride: 105 mmol/L (ref 98–111)
Creatinine, Ser: 1.27 mg/dL — ABNORMAL HIGH (ref 0.61–1.24)
GFR, Estimated: 57 mL/min — ABNORMAL LOW (ref >60)
Glucose, Bld: 172 mg/dL — ABNORMAL HIGH (ref 70–99)
Potassium: 3.8 mmol/L (ref 3.5–5.1)
Sodium: 140 mmol/L (ref 135–145)
Total Bilirubin: 0.6 mg/dL (ref 0.3–1.2)
Total Protein: 6.5 g/dL (ref 6.5–8.1)

## 2022-12-06 LAB — LIPID PANEL
Cholesterol: 142 mg/dL (ref 0–200)
HDL: 42 mg/dL (ref >40)
LDL Cholesterol: 60 mg/dL (ref 0–99)
Total CHOL/HDL Ratio: 3.4 RATIO
Triglycerides: 202 mg/dL — ABNORMAL HIGH (ref <150)
VLDL: 40 mg/dL (ref 0–40)

## 2022-12-06 NOTE — Patient Instructions (Signed)
It was great to see you today! No medication changes are needed at this time.  Labs today We will only contact you if something comes back abnormal or we need to make some changes. Otherwise no news is good news!  Your physician recommends that you schedule a follow-up appointment in: 3 months with Dr Aundra Dubin and echo  Your physician has requested that you have an echocardiogram. Echocardiography is a painless test that uses sound waves to create images of your heart. It provides your doctor with information about the size and shape of your heart and how well your heart's chambers and valves are working. This procedure takes approximately one hour. There are no restrictions for this procedure. Please do NOT wear cologne, perfume, aftershave, or lotions (deodorant is allowed). Please arrive 15 minutes prior to your appointment time.   Do the following things EVERYDAY: Weigh yourself in the morning before breakfast. Write it down and keep it in a log. Take your medicines as prescribed Eat low salt foods--Limit salt (sodium) to 2000 mg per day.  Stay as active as you can everyday Limit all fluids for the day to less than 2 liters  At the [facility], you and your health needs are our priority. As part of our continuing mission to provide you with exceptional heart care, we have created designated Provider Care Teams. These Care Teams include your primary Cardiologist (physician) and Advanced Practice Providers (APPs- Physician Assistants and Nurse Practitioners) who all work together to provide you with the care you need, when you need it.   You may see any of the following providers on your designated Care Team at your next follow up: Dr Glori Bickers Dr Loralie Champagne Dr. Roxana Hires, NP Lyda Jester, [place] [facility] [place], [place] Forestine Na, NP Audry Riles, PharmD   Please be sure to bring in all your medications bottles to every  appointment.    Thank you for choosing [facility]   If you have any questions or concerns before your next appointment please send [place] a message through [place] or call our office at [phone].    TO LEAVE A MESSAGE FOR THE NURSE SELECT OPTION 2, PLEASE LEAVE A MESSAGE INCLUDING: YOUR NAME DATE OF BIRTH CALL BACK NUMBER REASON FOR CALL**this is important as we prioritize the call backs  YOU WILL RECEIVE A CALL BACK THE SAME DAY AS LONG AS YOU CALL BEFORE 4:00 PM

## 2022-12-06 NOTE — Progress Notes (Signed)
Advanced [facility] Failure Clinic Progress Note  PCP: Myrlene Broker, MD Cardiology: Dr. Bettina Gavia HF Cardiology: Dr. Aundra Dubin  82 y.o. with history of CAD, aortic stenosis s/p TAVR, and systolic CHF was self-referred to CHF clinic for evaluation of cardiomyopathy.  Patient was found to have severe AS in 2021.  Pre-TAVR evaluation showed severe proximal LAD stenosis treated by DES in 4/21.  He had TAVR in 5/21 with 29 mm Medtronic Evolut THV.  Echo in 5/22 showed EF 50-55%, normal TAVR valve.   He developed gradually worsening exertional dyspnea over the last 2 months.  He especially noted dyspnea walking up a hill or incline.  He has also had on and off "soreness" in his chest.  This is not clearly exertional.  He developed orthopnea.  He was admitted in 5/23 at [facility] with CHF.  He was incidentally found to have COVID-19.  Echo showed EF down to 30-35% with normal TAVR valve function.  He was diuresed and had a right thoracentesis.  He felt much better by the time of discharge.    RHC/LHC was done in 6/23; filling pressures were optimized and CO was normal, moderate disease in OM2 did not explain fall in EF.     Most recent echo 12/23 showed EF 30% with global hypokinesis, normal RV, normal bioprosthetic aortic valve with mean gradient 10 and no AI (s/p TAVR), IVC normal.   He returns today for routine 3 month f/u. Reports doing well. Able to get around and do most ADLs w/o much difficulty. Only SOB ambulating inclines. Denies CP. No LEE, orthopnea/PND. 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 Complaint w/ medications. Tolerates ok w/o side effects. BP this morning 116/56 and pulse rate 60 bmp prior to AM meds.   ECG (personally reviewed): NSR 60 bpm w/ LBBB 138 ms  Labs (5/23): K 4.2, creatinine 1.5, LDL 71 Labs (6/23): K 5.6, creatinine 1.5 Labs (9/23): K 4.6, creatinine 1.34 Labs (12/23): K 3.6, creatinine 1.22   PMH:  1. Barrett's esophagus 2. Chronic back pain 3.  HTN 4. Type 2 diabetes 5. Contrast allergy 6. CKD stage 3 7. Aortic stenosis: Severe AS, now s/p TAVR in 5/21 with 29 mm Medtronic Evolut Pro.  8. CAD: 4/21 PCI to proximal LAD (pre-TAVR).  - LHC (6/23): 70% OM2, 50% ostial RCA 9. Carotid stenosis: Carotid dopplers (5/21) with 50-69% RICA stenosis.  10. COVID-19 5/23 11. Chronic systolic CHF: Cause uncertain.   - Echo (5/22): EF 50-55%, normal RV, normal bioprosthetic AoV s/p TAVR mean gradient 10 mmHg - Echo (5/23): EF 30-35%, normal RV, normal bioprosthetic AoV s/p TAVR mean gradient 7 mmHg - RHC (6/23): mean RA 2, PA 27/7, mean PCWP 5, CI 3.01 - Echo (12/23): EF 30% with global hypokinesis, normal RV, normal bioprosthetic aortic valve with mean gradient 10 and no AI (s/p TAVR), IVC normal 12. LBBB  SH: Lives in [place], nonsmoker, no ETOH. Married.   Family History  Problem Relation Age of Onset   Diabetes Mother    Heart disease Father    Hypertension Father    ROS: All systems reviewed and negative except as per HPI.   Current Outpatient Medications  Medication Sig Dispense Refill   acetaminophen (TYLENOL) 500 MG tablet Take 1,000 mg by mouth every 6 (six) hours as needed for moderate pain (Back pain).     aspirin EC 81 MG tablet Take 81 mg by mouth daily.     atorvastatin (LIPITOR) 40 MG tablet Take 1  tablet (40 mg total) by mouth daily. 30 tablet 3   carvedilol (COREG) 3.125 MG tablet Take 1 tablet (3.125 mg total) by mouth 2 (two) times daily. 180 tablet 3   colchicine 0.6 MG tablet Take 2 tablets by mouth as needed. For gout attacks     empagliflozin (JARDIANCE) 25 MG TABS tablet Take 12.5 mg (1/2 tablet) daily. 45 tablet 3   Fluocinolone Acetonide 0.01 % OIL Place 1 drop in ear(s) daily as needed (itching).     furosemide (LASIX) 40 MG tablet Take 0.5 tablets (20 mg total) by mouth daily. 45 tablet 3   Magnesium Oxide 400 MG CAPS Take 1 capsule (400 mg total) by mouth 2 (two) times daily. 60 capsule 6   metFORMIN  (GLUCOPHAGE) 1000 MG tablet Take 1,000 mg by mouth 2 (two) times daily with a meal.     Multiple Vitamins-Minerals (PRESERVISION AREDS 2+MULTI VIT) CAPS Take 1 capsule by mouth daily.     pantoprazole (PROTONIX) 40 MG tablet Take 1 tablet (40 mg total) by mouth daily. 90 tablet 3   sacubitril-valsartan (ENTRESTO) 97-103 MG Take 1 tablet by mouth 2 (two) times daily. 180 tablet 3   spironolactone (ALDACTONE) 25 MG tablet Take 1 tablet (25 mg total) by mouth daily. 90 tablet 3   No current facility-administered medications for this encounter.   BP (!) 116/56   Pulse 60   Wt 84.1 kg (185 lb 6.4 oz)   SpO2 96%   BMI 29.04 kg/m  PHYSICAL EXAM: General:  Well appearing elderly male. No respiratory difficulty HEENT: normal Neck: supple. no JVD. Carotids 2+ bilat; no bruits. No lymphadenopathy or thyromegaly appreciated. Cor: PMI nondisplaced. Regular rate & rhythm. No rubs, gallops or murmurs. Lungs: clear Abdomen: soft, nontender, nondistended. No hepatosplenomegaly. No bruits or masses. Good bowel sounds. Extremities: no cyanosis, clubbing, rash, edema Neuro: alert & oriented x 3, cranial nerves grossly intact. moves all 4 extremities w/o difficulty. Affect pleasant.   Assessment/Plan: 1. Chronic systolic CHF: Echo in [id number] with EF 30-35%, normally-functioning bioprosthetic AoV and normal RV.  Most recent prior echo from 5/22 showed EF 50-55%.  Fall in EF was associated with CHF exacerbation and atypical chest pain.  He also has LBBB that is new since 3/22.  RHC/LHC was done in 6/23; filling pressures were optimized and CO was normal, moderate disease in OM2 did not explain fall in EF.  Repeat Echo 12/23 EF 30% with global hypokinesis, normal RV, normal bioprosthetic aortic valve with mean gradient 10 and no AI (s/p TAVR), IVC normal.   - Stable NYHA Class II symptoms. Volume stable, euvolemic on exam  - Continue Coreg 3.125 mg bid. No dose titration given h/o bradycardia  - Continue Entresto  97/103 bid. - Continue Farxiga 10 mg daily.  - Continue spironolactone 25 mg daily  - Check CMP today  - LBBB is not markedly wide, 138 msec on today's EKG. Does not appear wide enough that he would derive significant benefit from CRT.  - Plan repeat echo next visit w/ MD in 3 months  2. CAD: PCI to proximal LAD pre-TAVR in 4/21, not associated with ACS.  Cath in 6/23 showed 70% OM2 stenosis, medically managed. Stable w/o CP  - Continue ASA 81 mg  - Continue atorvastatin 40 daily. Check FLP and HFTs today  3. CKD: Stage 3.  B/l Scr ~1.3 - on Farxiga  - check CMP today  4. Aortic stenosis: S/p TAVR with Medtronic Evolut valve, valve was functioning  normally on 12/23 echo.  - SBE prophylaxis w/ dental work   F/u w/ Dr. Aundra Dubin in 3 months, repeat echo same day.   Lyda Jester, PA-C  12/06/2022

## 2022-12-11 ENCOUNTER — Other Ambulatory Visit (HOSPITAL_COMMUNITY): Payer: Self-pay | Admitting: Cardiology

## 2022-12-20 ENCOUNTER — Telehealth (HOSPITAL_BASED_OUTPATIENT_CLINIC_OR_DEPARTMENT_OTHER): Payer: Self-pay | Admitting: Cardiology

## 2022-12-20 NOTE — Telephone Encounter (Signed)
Left message for patient to call and discuss scheduling the [facility] referral for Dr. Bettina Gavia

## 2022-12-21 NOTE — Telephone Encounter (Signed)
Left message for patient to call and schedule office visit with Dr, Thedore Mins [facility] referral

## 2022-12-25 NOTE — Telephone Encounter (Signed)
Good afternoon Zigmund Daniel, Please shred the [place] Referral information that you have for Andre Jimenez, the [facility] stated that the pt does not want to see Dr. Bettina Gavia at this time he wants to stay with Dr. Aundra Dubin.  Thanks

## 2023-04-30 ENCOUNTER — Ambulatory Visit (HOSPITAL_BASED_OUTPATIENT_CLINIC_OR_DEPARTMENT_OTHER)
Admission: RE | Admit: 2023-04-30 | Discharge: 2023-04-30 | Disposition: A | Discharge status: Home / Self Care | Payer: No Typology Code available for payment source | Source: Ambulatory Visit | Attending: Cardiology | Admitting: Cardiology

## 2023-04-30 ENCOUNTER — Ambulatory Visit (HOSPITAL_COMMUNITY)
Admission: RE | Admit: 2023-04-30 | Discharge: 2023-04-30 | Disposition: A | Discharge status: Home / Self Care | Payer: No Typology Code available for payment source | Source: Ambulatory Visit | Attending: Cardiology | Admitting: Cardiology

## 2023-04-30 ENCOUNTER — Encounter (HOSPITAL_COMMUNITY): Payer: Self-pay | Admitting: Cardiology

## 2023-04-30 VITALS — BP 114/80 | HR 46 | Wt 187.2 lb

## 2023-04-30 DIAGNOSIS — I130 Hypertensive heart and chronic kidney disease with heart failure and stage 1 through stage 4 chronic kidney disease, or unspecified chronic kidney disease: Secondary | ICD-10-CM | POA: Insufficient documentation

## 2023-04-30 DIAGNOSIS — Z8249 Family history of ischemic heart disease and other diseases of the circulatory system: Secondary | ICD-10-CM | POA: Diagnosis not present

## 2023-04-30 DIAGNOSIS — Z7984 Long term (current) use of oral hypoglycemic drugs: Secondary | ICD-10-CM | POA: Insufficient documentation

## 2023-04-30 DIAGNOSIS — E1122 Type 2 diabetes mellitus with diabetic chronic kidney disease: Secondary | ICD-10-CM | POA: Diagnosis not present

## 2023-04-30 DIAGNOSIS — Z7982 Long term (current) use of aspirin: Secondary | ICD-10-CM | POA: Insufficient documentation

## 2023-04-30 DIAGNOSIS — Z79899 Other long term (current) drug therapy: Secondary | ICD-10-CM | POA: Insufficient documentation

## 2023-04-30 DIAGNOSIS — Z8616 Personal history of COVID-19: Secondary | ICD-10-CM | POA: Diagnosis not present

## 2023-04-30 DIAGNOSIS — Z955 Presence of coronary angioplasty implant and graft: Secondary | ICD-10-CM | POA: Diagnosis not present

## 2023-04-30 DIAGNOSIS — I2510 Atherosclerotic heart disease of native coronary artery without angina pectoris: Secondary | ICD-10-CM | POA: Diagnosis not present

## 2023-04-30 DIAGNOSIS — I447 Left bundle-branch block, unspecified: Secondary | ICD-10-CM | POA: Insufficient documentation

## 2023-04-30 DIAGNOSIS — N183 Chronic kidney disease, stage 3 unspecified: Secondary | ICD-10-CM | POA: Insufficient documentation

## 2023-04-30 DIAGNOSIS — I779 Disorder of arteries and arterioles, unspecified: Secondary | ICD-10-CM | POA: Diagnosis not present

## 2023-04-30 DIAGNOSIS — I5022 Chronic systolic (congestive) heart failure: Secondary | ICD-10-CM | POA: Diagnosis present

## 2023-04-30 DIAGNOSIS — I080 Rheumatic disorders of both mitral and aortic valves: Secondary | ICD-10-CM | POA: Insufficient documentation

## 2023-04-30 DIAGNOSIS — I428 Other cardiomyopathies: Secondary | ICD-10-CM | POA: Diagnosis not present

## 2023-04-30 DIAGNOSIS — Z953 Presence of xenogenic heart valve: Secondary | ICD-10-CM | POA: Diagnosis not present

## 2023-04-30 LAB — BASIC METABOLIC PANEL
Anion gap: 11 (ref 5–15)
BUN: 17 mg/dL (ref 8–23)
CO2: 22 mmol/L (ref 22–32)
Calcium: 8.7 mg/dL — ABNORMAL LOW (ref 8.9–10.3)
Chloride: 106 mmol/L (ref 98–111)
Creatinine, Ser: 1.16 mg/dL (ref 0.61–1.24)
GFR, Estimated: >60 mL/min (ref >60)
Glucose, Bld: 100 mg/dL — ABNORMAL HIGH (ref 70–99)
Potassium: 3.4 mmol/L — ABNORMAL LOW (ref 3.5–5.1)
Sodium: 139 mmol/L (ref 135–145)

## 2023-04-30 LAB — ECHOCARDIOGRAM COMPLETE
AR max vel: 2.01 cm2
AV Area VTI: 1.87 cm2
AV Area mean vel: 1.93 cm2
AV Mean grad: 10.5 mmHg
AV Peak grad: 17.9 mmHg
Ao pk vel: 2.12 m/s
Area-P 1/2: 1.98 cm2
Calc EF: 45.5 %
MV M vel: 4.76 m/s
MV Peak grad: 90.6 mmHg
MV VTI: 2.94 cm2
S' Lateral: 5.2 cm
Single Plane A2C EF: 47.7 %
Single Plane A4C EF: 43.6 %

## 2023-04-30 LAB — BRAIN NATRIURETIC PEPTIDE: B Natriuretic Peptide: 217.7 pg/mL — ABNORMAL HIGH (ref 0.0–100.0)

## 2023-04-30 NOTE — Progress Notes (Signed)
  Echocardiogram 2D Echocardiogram has been performed.  Andre Jimenez 04/30/2023, 12:14 PM

## 2023-04-30 NOTE — Patient Instructions (Signed)
There has been no changes to your medications.  Labs done today, your results will be available in MyChart, we will contact you for abnormal readings.  Your physician has requested that you have a cardiac MRI. Cardiac MRI uses a computer to create images of your heart as its beating, producing both still and moving pictures of your heart and major blood vessels. For further information please visit [email or web address]. Please follow the instruction sheet given to you today for more information. ?  [facility] [address] [place], [place] [address] [phone] Please take advantage of the free valet parking available at the [facility] and [employer] (Entrance C).  Proceed to the [facility] Radiology Department (First Floor) for check-in.    Magnetic resonance imaging (MRI) is a painless test that produces images of the inside of the body without using Xrays.  During an MRI, strong magnets and radio waves work together in a Data processing manager to form detailed images.   MRI images may provide more details about a medical condition than X-rays, CT scans, and ultrasounds can provide.  You may be given earphones to listen for instructions.  You may eat a light breakfast and take medications as ordered with the exception of furosemide, hydrochlorothiazide, or spironolactone(fluid pill, other). Please avoid stimulants for 12 hr prior to test. (Ie. Caffeine, nicotine, chocolate, or antihistamine medications)  If a contrast material will be used, an IV will be inserted into one of your veins. Contrast material will be injected into your IV. It will leave your body through your urine within a day. You may be told to drink plenty of fluids to help flush the contrast material out of your system.  You will be asked to remove all metal, including: Watch, jewelry, and other metal objects including hearing aids, hair pieces and dentures. Also wearable glucose monitoring systems (ie.  Freestyle Libre and Omnipods) (Braces and fillings normally are not a problem.)   TEST WILL TAKE APPROXIMATELY 1 HOUR  PLEASE NOTIFY SCHEDULING AT LEAST 24 HOURS IN ADVANCE IF YOU ARE UNABLE TO KEEP YOUR APPOINTMENT. [phone]  For more information and frequently asked questions, please visit our website : [email or web address]  Please call Rockwell [place], cardiac imaging nurse navigator with any questions/concerns. Cardiac Imaging Nurse Navigators [facility] [phone] Office    Your physician recommends that you schedule a follow-up appointment in: 3 months  If you have any questions or concerns before your next appointment please send [place] a message through [place] or call our office at [phone].    TO LEAVE A MESSAGE FOR THE NURSE SELECT OPTION 2, PLEASE LEAVE A MESSAGE INCLUDING: YOUR NAME DATE OF BIRTH CALL BACK NUMBER REASON FOR CALL**this is important as we prioritize the call backs  YOU WILL RECEIVE A CALL BACK THE SAME DAY AS LONG AS YOU CALL BEFORE 4:00 PM  At the [facility], you and your health needs are our priority. As part of our continuing mission to provide you with exceptional heart care, we have created designated Provider Care Teams. These Care Teams include your primary Cardiologist (physician) and Advanced Practice Providers (APPs- Physician Assistants and Nurse Practitioners) who all work together to provide you with the care you need, when you need it.   You may see any of the following providers on your designated Care Team at your next follow up: Dr Arvilla Meres Dr Marca Ancona Dr. Marcos Eke, NP Robbie Lis, [place] [facility]  [place], [place] Brynda Peon, NP Karle Plumber, PharmD   Please be sure to bring in all your medications bottles to every appointment.    Thank you for choosing [facility]

## 2023-05-01 ENCOUNTER — Telehealth (HOSPITAL_COMMUNITY): Payer: Self-pay

## 2023-05-01 NOTE — Telephone Encounter (Addendum)
Pt aware, agreeable, and verbalized understanding   ----- Message from Marca Ancona sent at 05/01/2023  7:44 AM EDT ----- Increase K in diet.

## 2023-05-01 NOTE — Progress Notes (Signed)
[facility] Progress Note  PCP: Hadley Pen, MD Cardiology: Dr. Dulce Sellar HF Cardiology: Dr. Shirlee Latch  82 y.o. with history of CAD, aortic stenosis s/p TAVR, and systolic CHF was self-referred to CHF clinic for evaluation of cardiomyopathy.  Patient was found to have severe AS in 2021.  Pre-TAVR evaluation showed severe proximal LAD stenosis treated by DES in 4/21.  He had TAVR in 5/21 with 29 mm Medtronic Evolut THV.  Echo in 5/22 showed EF 50-55%, normal TAVR valve.   He developed gradually worsening exertional dyspnea over the last 2 months.  He especially noted dyspnea walking up a hill or incline.  He has also had on and off "soreness" in his chest.  This is not clearly exertional.  He developed orthopnea.  He was admitted in 5/23 at [facility] with CHF.  He was incidentally found to have COVID-19.  Echo showed EF down to 30-35% with normal TAVR valve function.  He was diuresed and had a right thoracentesis.  He felt much better by the time of discharge.    RHC/LHC was done in 6/23; filling pressures were optimized and CO was normal, moderate disease in OM2 did not explain fall in EF.     Echo 12/23 showed EF 30% with global hypokinesis, normal RV, normal bioprosthetic aortic valve with mean gradient 10 and no AI (s/p TAVR), IVC normal.   Echo was done today and reviewed, EF 35-40% with septal-lateral dyssynchrony, normal RV, IVC normal, s/p TAVR with mean gradient 13 mmHg and no paravalvular leakage.   He returns today for followup of CHF. He has an occasional dry cough. He can walk up stairs and on flat ground without significant dyspnea.  Lightheaded if he stands too fast.  No chest pain.  No orthopnea/PND. Weight has been fairly stable.   ECG (personally reviewed): NSR, LBBB 150 msec  Labs (5/23): K 4.2, creatinine 1.5, LDL 71 Labs (6/23): K 5.6, creatinine 1.5 Labs (9/23): K 4.6, creatinine 1.34 Labs (12/23): K 3.6, creatinine 1.22  Labs (3/24): LDL 60,  K 3.8, creatinine 1.27  PMH:  1. Barrett's esophagus 2. Chronic back pain 3. HTN 4. Type 2 diabetes 5. Contrast allergy 6. CKD stage 3 7. Aortic stenosis: Severe AS, now s/p TAVR in 5/21 with 29 mm Medtronic Evolut Pro.  8. CAD: 4/21 PCI to proximal LAD (pre-TAVR).  - LHC (6/23): 70% OM2, 50% ostial RCA 9. Carotid stenosis: Carotid dopplers (5/21) with 50-69% RICA stenosis.  10. COVID-19 5/23 11. Chronic systolic CHF: Cause uncertain.   - Echo (5/22): EF 50-55%, normal RV, normal bioprosthetic AoV s/p TAVR mean gradient 10 mmHg - Echo (5/23): EF 30-35%, normal RV, normal bioprosthetic AoV s/p TAVR mean gradient 7 mmHg - RHC (6/23): mean RA 2, PA 27/7, mean PCWP 5, CI 3.01 - Echo (12/23): EF 30% with global hypokinesis, normal RV, normal bioprosthetic aortic valve with mean gradient 10 and no AI (s/p TAVR), IVC normal - Echo (7/24): EF 35-40% with septal-lateral dyssynchrony, normal RV, IVC normal, s/p TAVR with mean gradient 13 mmHg and no paravalvular leakage.  12. LBBB  SH: Lives in [place], nonsmoker, no ETOH. Married.   Family History  Problem Relation Age of Onset   Diabetes Mother    Heart disease Father    Hypertension Father    ROS: All systems reviewed and negative except as per HPI.   Current Outpatient Medications  Medication Sig Dispense Refill   acetaminophen (TYLENOL) 500 MG tablet Take 1,000  mg by mouth every 6 (six) hours as needed for moderate pain (Back pain).     aspirin EC 81 MG tablet Take 81 mg by mouth daily.     atorvastatin (LIPITOR) 40 MG tablet Take 1 tablet (40 mg total) by mouth daily. 30 tablet 3   carvedilol (COREG) 3.125 MG tablet Take 1 tablet (3.125 mg total) by mouth 2 (two) times daily. 180 tablet 3   colchicine 0.6 MG tablet Take 2 tablets by mouth as needed. For gout attacks     empagliflozin (JARDIANCE) 25 MG TABS tablet Take 12.5 mg (1/2 tablet) daily. 45 tablet 3   Fluocinolone Acetonide 0.01 % OIL Place 1 drop in ear(s) daily as  needed (itching).     furosemide (LASIX) 40 MG tablet Take 0.5 tablets (20 mg total) by mouth daily. 45 tablet 3   MAGnesium-Oxide 400 (240 Mg) MG tablet Take 1 tablet (400 mg total) by mouth 2 (two) times daily. 60 tablet 11   metFORMIN (GLUCOPHAGE) 1000 MG tablet Take 1,000 mg by mouth 2 (two) times daily with a meal.     Multiple Vitamins-Minerals (PRESERVISION AREDS 2+MULTI VIT) CAPS Take 1 capsule by mouth daily.     pantoprazole (PROTONIX) 40 MG tablet Take 1 tablet (40 mg total) by mouth daily. 90 tablet 3   sacubitril-valsartan (ENTRESTO) 97-103 MG Take 1 tablet by mouth 2 (two) times daily. 180 tablet 3   spironolactone (ALDACTONE) 25 MG tablet Take 1 tablet (25 mg total) by mouth daily. 90 tablet 3   No current facility-administered medications for this encounter.   BP 114/80   Pulse (!) 46   Wt 84.9 kg (187 lb 3.2 oz)   SpO2 98%   BMI 29.32 kg/m  PHYSICAL EXAM: General: NAD Neck: No JVD, no thyromegaly or thyroid nodule.  Lungs: Clear to auscultation bilaterally with normal respiratory effort. CV: Nondisplaced PMI.  Heart regular S1/S2 with paradoxical S2 split, no S3/S4, no murmur.  No peripheral edema.  No carotid bruit.  Normal pedal pulses.  Abdomen: Soft, nontender, no hepatosplenomegaly, no distention.  Skin: Intact without lesions or rashes.  Neurologic: Alert and oriented x 3.  Psych: Normal affect. Extremities: No clubbing or cyanosis.  HEENT: Normal.   Assessment/Plan: 1. Chronic systolic CHF: Echo in 5/23 with EF 30-35%, normally-functioning bioprosthetic AoV and normal RV.  Most recent prior echo from 5/22 showed EF 50-55%.  Fall in EF was associated with CHF exacerbation and atypical chest pain.  He also has LBBB that is new since 3/22.  RHC/LHC was done in 6/23; filling pressures were optimized and CO was normal, moderate disease in OM2 did not explain fall in EF.  Repeat Echo 12/23 EF 30% with global hypokinesis, normal RV, normal bioprosthetic aortic valve  with mean gradient 10 and no AI (s/p TAVR), IVC normal.  Echo today showed EF 35-40% with septal-lateral dyssynchrony, normal RV, IVC normal, s/p TAVR with mean gradient 13 mmHg and no paravalvular leakage. Nonischemic cardiomyopathy, ?LBBB cardiomyopathy. NYHA class II, not volume overloaded on exam.  - Continue Coreg 3.125 mg bid. No dose titration given mild bradycardia  - Continue Entresto 97/103 bid. - Continue Farxiga 10 mg daily.  - Continue spironolactone 25 mg daily, BMET/BNP today.  - LBBB 150 msec noted. EF is borderline for CRT-P, I estimated 35-40% today, dyssynchrony present.  I would like him to have a cardiac MRI for better quantification of EF (if < 35%, would plan for CRT-P) and also to look for infiltrative  disease.  2. CAD: PCI to proximal LAD pre-TAVR in 4/21, not associated with ACS.  Cath in 6/23 showed 70% OM2 stenosis, medically managed. Stable w/o CP  - Continue ASA 81 mg  - Continue atorvastatin 40 daily. Good lipids in 3/24.  3. CKD: Stage 3.  Baseline Scr ~1.3 - on Farxiga  - BMET today.  4. Aortic stenosis: S/p TAVR with Medtronic Evolut valve, normal valve function on 7/24 echo.   - SBE prophylaxis w/ dental work   Followup in 3 months with APP.   Marca Ancona,  05/01/2023

## 2023-06-07 ENCOUNTER — Telehealth (HOSPITAL_COMMUNITY): Payer: Self-pay | Admitting: Cardiology

## 2023-06-07 NOTE — Telephone Encounter (Signed)
Updated community care referral faxed

## 2023-07-04 ENCOUNTER — Encounter (HOSPITAL_COMMUNITY): Payer: Self-pay

## 2023-07-04 ENCOUNTER — Ambulatory Visit (HOSPITAL_COMMUNITY)
Admission: RE | Admit: 2023-07-04 | Discharge: 2023-07-04 | Disposition: A | Discharge status: Home / Self Care | Payer: Medicare Other | Source: Ambulatory Visit | Attending: Cardiology | Admitting: Cardiology

## 2023-07-04 VITALS — BP 100/62 | HR 52 | Wt 177.2 lb

## 2023-07-04 DIAGNOSIS — I130 Hypertensive heart and chronic kidney disease with heart failure and stage 1 through stage 4 chronic kidney disease, or unspecified chronic kidney disease: Secondary | ICD-10-CM | POA: Insufficient documentation

## 2023-07-04 DIAGNOSIS — Z952 Presence of prosthetic heart valve: Secondary | ICD-10-CM | POA: Insufficient documentation

## 2023-07-04 DIAGNOSIS — Z951 Presence of aortocoronary bypass graft: Secondary | ICD-10-CM | POA: Diagnosis not present

## 2023-07-04 DIAGNOSIS — I2510 Atherosclerotic heart disease of native coronary artery without angina pectoris: Secondary | ICD-10-CM | POA: Diagnosis present

## 2023-07-04 DIAGNOSIS — R9431 Abnormal electrocardiogram [ECG] [EKG]: Secondary | ICD-10-CM | POA: Insufficient documentation

## 2023-07-04 DIAGNOSIS — I428 Other cardiomyopathies: Secondary | ICD-10-CM | POA: Diagnosis not present

## 2023-07-04 DIAGNOSIS — N183 Chronic kidney disease, stage 3 unspecified: Secondary | ICD-10-CM | POA: Insufficient documentation

## 2023-07-04 DIAGNOSIS — I5022 Chronic systolic (congestive) heart failure: Secondary | ICD-10-CM | POA: Diagnosis not present

## 2023-07-04 DIAGNOSIS — E782 Mixed hyperlipidemia: Secondary | ICD-10-CM | POA: Diagnosis not present

## 2023-07-04 DIAGNOSIS — I5020 Unspecified systolic (congestive) heart failure: Secondary | ICD-10-CM | POA: Diagnosis present

## 2023-07-04 LAB — BRAIN NATRIURETIC PEPTIDE: B Natriuretic Peptide: 307.4 pg/mL — ABNORMAL HIGH (ref 0.0–100.0)

## 2023-07-04 LAB — COMPREHENSIVE METABOLIC PANEL
ALT: 8 U/L (ref 0–44)
AST: 16 U/L (ref 15–41)
Albumin: 3.9 g/dL (ref 3.5–5.0)
Alkaline Phosphatase: 81 U/L (ref 38–126)
Anion gap: 19 — ABNORMAL HIGH (ref 5–15)
BUN: 20 mg/dL (ref 8–23)
CO2: 24 mmol/L (ref 22–32)
Calcium: 9.1 mg/dL (ref 8.9–10.3)
Chloride: 101 mmol/L (ref 98–111)
Creatinine, Ser: 1.2 mg/dL (ref 0.61–1.24)
GFR, Estimated: >60 mL/min (ref >60)
Glucose, Bld: 114 mg/dL — ABNORMAL HIGH (ref 70–99)
Potassium: 4.3 mmol/L (ref 3.5–5.1)
Sodium: 144 mmol/L (ref 135–145)
Total Bilirubin: 0.6 mg/dL (ref 0.3–1.2)
Total Protein: 7 g/dL (ref 6.5–8.1)

## 2023-07-04 LAB — LIPID PANEL
Cholesterol: 150 mg/dL (ref 0–200)
HDL: 46 mg/dL (ref >40)
LDL Cholesterol: 58 mg/dL (ref 0–99)
Total CHOL/HDL Ratio: 3.3 {ratio}
Triglycerides: 230 mg/dL — ABNORMAL HIGH (ref <150)
VLDL: 46 mg/dL — ABNORMAL HIGH (ref 0–40)

## 2023-07-04 NOTE — Patient Instructions (Signed)
Medication Changes:  No Changes In Medications at this time.   Lab Work:  Labs done today, your results will be available in MyChart, we will contact you for abnormal readings.  Follow-Up in: 3 MONTHS PLEASE CALL OUR OFFICE AROUND NOVEMBER TO GET SCHEDULED FOR YOUR APPOINTMENT. PHONE NUMBER IS [phone] OPTION 2   At the [facility], you and your health needs are our priority. We have a designated team specialized in the treatment of Heart Failure. This Care Team includes your primary Heart Failure Specialized Cardiologist (physician), Advanced Practice Providers (APPs- Physician Assistants and Nurse Practitioners), and Pharmacist who all work together to provide you with the care you need, when you need it.   You may see any of the following providers on your designated Care Team at your next follow up:  Dr. Arvilla Meres Dr. Marca Ancona Dr. Dorthula Nettles Dr. Theresia Bough Tonye Becket, NP Robbie Lis, [place] [facility] [place], [place] Brynda Peon, NP [place] Lee, NP Karle Plumber, PharmD   Please be sure to bring in all your medications bottles to every appointment.   Need to Contact us:  If you have any questions or concerns before your next appointment please send [place] a message through [place] or call our office at [phone].    TO LEAVE A MESSAGE FOR THE NURSE SELECT OPTION 2, PLEASE LEAVE A MESSAGE INCLUDING: YOUR NAME DATE OF BIRTH CALL BACK NUMBER REASON FOR CALL**this is important as we prioritize the call backs  YOU WILL RECEIVE A CALL BACK THE SAME DAY AS LONG AS YOU CALL BEFORE 4:00 PM

## 2023-07-04 NOTE — Progress Notes (Signed)
Advanced [facility] Clinic Progress Note  PCP: Hadley Pen, MD Cardiology: Dr. Dulce Sellar HF Cardiology: Dr. Shirlee Latch  82 y.o. with history of CAD, aortic stenosis s/p TAVR, and systolic CHF was self-referred to CHF clinic for evaluation of cardiomyopathy.  Patient was found to have severe AS in 2021.  Pre-TAVR evaluation showed severe proximal LAD stenosis treated by DES in 4/21.  He had TAVR in 5/21 with 29 mm Medtronic Evolut THV.  Echo in 5/22 showed EF 50-55%, normal TAVR valve.   In early 2023, he developed gradually worsening exertional dyspnea over the course 2 months.  He especially noted dyspnea walking up a hill or incline.  He has also had on and off "soreness" in his chest.  This is not clearly exertional.  He developed orthopnea.  He was admitted in 5/23 at [facility] with CHF.  He was incidentally found to have COVID-19.  Echo showed EF down to 30-35% with normal TAVR valve function.  He was diuresed and had a right thoracentesis.  He felt much better by the time of discharge.    RHC/LHC was done in 6/23; filling pressures were optimized and CO was normal, moderate disease in OM2 did not explain fall in EF.     Echo 12/23 showed EF 30% with global hypokinesis, normal RV, normal bioprosthetic aortic valve with mean gradient 10 and no AI (s/p TAVR), IVC normal.   Echo 7/24 EF 35-40% with septal-lateral dyssynchrony, normal RV, IVC normal, s/p TAVR with mean gradient 13 mmHg and no paravalvular leakage.   At last OV, he was ordered to complete cMRI to better quantify EF for qualification for CRT-P and to assess for infiltrative CM. This has not yet been completed, scheduled for 10/24.   He presents back today for 3 month f/u. Here w/ his daughter-in-law. Doing fairly well. Denies CP. No resting dyspnea. Able to go most ADLs w/o much difficulty. No dyspnea walking on flat surfaces but SOB ambulating stairs and inclines. He has lost about about 10 lb in the last several  months. No LEE.   Looking forward to the start of deer hunting season.    ECG (personally reviewed): SB 54 bpm, QRS 140 ms   Labs (5/23): K 4.2, creatinine 1.5, LDL 71 Labs (6/23): K 5.6, creatinine 1.5 Labs (9/23): K 4.6, creatinine 1.34 Labs (12/23): K 3.6, creatinine 1.22  Labs (3/24): LDL 60, K 3.8, creatinine 1.61 Labs (7/24): K 3.4, creatinine 1.16   PMH:  1. Barrett's esophagus 2. Chronic back pain 3. HTN 4. Type 2 diabetes 5. Contrast allergy 6. CKD stage 3 7. Aortic stenosis: Severe AS, now s/p TAVR in 5/21 with 29 mm Medtronic Evolut Pro.  8. CAD: 4/21 PCI to proximal LAD (pre-TAVR).  - LHC (6/23): 70% OM2, 50% ostial RCA 9. Carotid stenosis: Carotid dopplers (5/21) with 50-69% RICA stenosis.  10. COVID-19 5/23 11. Chronic systolic CHF: Cause uncertain.   - Echo (5/22): EF 50-55%, normal RV, normal bioprosthetic AoV s/p TAVR mean gradient 10 mmHg - Echo (5/23): EF 30-35%, normal RV, normal bioprosthetic AoV s/p TAVR mean gradient 7 mmHg - RHC (6/23): mean RA 2, PA 27/7, mean PCWP 5, CI 3.01 - Echo (12/23): EF 30% with global hypokinesis, normal RV, normal bioprosthetic aortic valve with mean gradient 10 and no AI (s/p TAVR), IVC normal - Echo (7/24): EF 35-40% with septal-lateral dyssynchrony, normal RV, IVC normal, s/p TAVR with mean gradient 13 mmHg and no paravalvular leakage.  12. LBBB  SH: Lives in [place], nonsmoker, no ETOH. Married.   Family History  Problem Relation Age of Onset   Diabetes Mother    Heart disease Father    Hypertension Father    ROS: All systems reviewed and negative except as per HPI.   Current Outpatient Medications  Medication Sig Dispense Refill   acetaminophen (TYLENOL) 500 MG tablet Take 1,000 mg by mouth every 6 (six) hours as needed for moderate pain (Back pain).     aspirin EC 81 MG tablet Take 81 mg by mouth daily.     atorvastatin (LIPITOR) 40 MG tablet Take 1 tablet (40 mg total) by mouth daily. 30 tablet 3   carvedilol  (COREG) 3.125 MG tablet Take 1 tablet (3.125 mg total) by mouth 2 (two) times daily. 180 tablet 3   colchicine 0.6 MG tablet Take 2 tablets by mouth as needed. For gout attacks     empagliflozin (JARDIANCE) 25 MG TABS tablet Take 12.5 mg (1/2 tablet) daily. 45 tablet 3   Fluocinolone Acetonide 0.01 % OIL Place 1 drop in ear(s) daily as needed (itching).     furosemide (LASIX) 40 MG tablet Take 0.5 tablets (20 mg total) by mouth daily. 45 tablet 3   MAGnesium-Oxide 400 (240 Mg) MG tablet Take 1 tablet (400 mg total) by mouth 2 (two) times daily. 60 tablet 11   metFORMIN (GLUCOPHAGE) 1000 MG tablet Take 1,000 mg by mouth 2 (two) times daily with a meal.     Multiple Vitamins-Minerals (PRESERVISION AREDS 2+MULTI VIT) CAPS Take 1 capsule by mouth daily.     pantoprazole (PROTONIX) 40 MG tablet Take 1 tablet (40 mg total) by mouth daily. 90 tablet 3   sacubitril-valsartan (ENTRESTO) 97-103 MG Take 1 tablet by mouth 2 (two) times daily. 180 tablet 3   spironolactone (ALDACTONE) 25 MG tablet Take 1 tablet (25 mg total) by mouth daily. 90 tablet 3   No current facility-administered medications for this encounter.   BP 100/62   Pulse (!) 52   Wt 80.4 kg (177 lb 3.2 oz)   SpO2 100%   BMI 27.75 kg/m  PHYSICAL EXAM: General:  Well appearing elderly. No respiratory difficulty HEENT: normal Neck: supple. no JVD. Carotids 2+ bilat; no bruits. No lymphadenopathy or thyromegaly appreciated. Cor: PMI nondisplaced. Regular rate & rhythm. No rubs, gallops or murmurs. Lungs: clear Abdomen: soft, nontender, nondistended. No hepatosplenomegaly. No bruits or masses. Good bowel sounds. Extremities: no cyanosis, clubbing, rash, edema Neuro: alert & oriented x 3, cranial nerves grossly intact. moves all 4 extremities w/o difficulty. Affect pleasant.  Assessment/Plan: 1. Chronic systolic CHF: Echo in 5/23 with EF 30-35%, normally-functioning bioprosthetic AoV and normal RV.  Most recent prior echo from 5/22  showed EF 50-55%.  Fall in EF was associated with CHF exacerbation and atypical chest pain.  He also has LBBB that is new since 3/22.  RHC/LHC was done in 6/23; filling pressures were optimized and CO was normal, moderate disease in OM2 did not explain fall in EF.  Repeat Echo 12/23 EF 30% with global hypokinesis, normal RV, normal bioprosthetic aortic valve with mean gradient 10 and no AI (s/p TAVR), IVC normal.  Echo 7/24 showed EF 35-40% with septal-lateral dyssynchrony, normal RV, IVC normal, s/p TAVR with mean gradient 13 mmHg and no paravalvular leakage. Nonischemic cardiomyopathy, ?LBBB cardiomyopathy. NYHA class II. Euvolemic on exam  - continue Lasix 20 mg daily  - Continue Coreg 3.125 mg bid. No dose titration given bradycardia  - Continue Entresto  97/103 bid. - Continue Farxiga 10 mg daily.  - Continue spironolactone 25 mg daily - Check BMP/BNP today  - LBBB on EKG. EF is borderline for CRT-P,  estimated 35-40% on recent echo w/ dyssynchrony present.  He has been ordered to get a cardiac MRI for better quantification of EF (if < 35%, would plan for CRT-P) and also to look for infiltrative disease. Study scheduled 10/24 2. CAD: PCI to proximal LAD pre-TAVR in 4/21, not associated with ACS.  Cath in 6/23 showed 70% OM2 stenosis, medically managed. Denies CP  - Continue ASA 81 mg  - Continue atorvastatin 40 daily.  LDL goal < 70. Check LP today  3. CKD: Stage 3.  Baseline Scr ~1.3 - on Farxiga  - Check BMP today  4. Aortic stenosis: S/p TAVR with Medtronic Evolut valve, normal valve function on 7/24 echo.   - aware of need for SBE prophylaxis w/ dental work     Followup in 3 months with Dr. Lillia Mountain, PA-C  07/04/2023

## 2023-07-08 ENCOUNTER — Emergency Department (HOSPITAL_COMMUNITY): Payer: No Typology Code available for payment source

## 2023-07-08 ENCOUNTER — Inpatient Hospital Stay (HOSPITAL_COMMUNITY)
Admission: EM | Admit: 2023-07-08 | Discharge: 2023-08-02 | DRG: 308 | Disposition: E | Payer: No Typology Code available for payment source | Attending: Pulmonary Disease | Admitting: Pulmonary Disease

## 2023-07-08 DIAGNOSIS — E1165 Type 2 diabetes mellitus with hyperglycemia: Secondary | ICD-10-CM | POA: Diagnosis present

## 2023-07-08 DIAGNOSIS — I9589 Other hypotension: Secondary | ICD-10-CM | POA: Diagnosis present

## 2023-07-08 DIAGNOSIS — I5023 Acute on chronic systolic (congestive) heart failure: Secondary | ICD-10-CM | POA: Diagnosis present

## 2023-07-08 DIAGNOSIS — R570 Cardiogenic shock: Secondary | ICD-10-CM | POA: Diagnosis present

## 2023-07-08 DIAGNOSIS — J690 Pneumonitis due to inhalation of food and vomit: Secondary | ICD-10-CM | POA: Diagnosis not present

## 2023-07-08 DIAGNOSIS — I255 Ischemic cardiomyopathy: Secondary | ICD-10-CM | POA: Diagnosis present

## 2023-07-08 DIAGNOSIS — E559 Vitamin D deficiency, unspecified: Secondary | ICD-10-CM | POA: Diagnosis present

## 2023-07-08 DIAGNOSIS — I469 Cardiac arrest, cause unspecified: Secondary | ICD-10-CM | POA: Diagnosis not present

## 2023-07-08 DIAGNOSIS — N170 Acute kidney failure with tubular necrosis: Secondary | ICD-10-CM | POA: Diagnosis not present

## 2023-07-08 DIAGNOSIS — I130 Hypertensive heart and chronic kidney disease with heart failure and stage 1 through stage 4 chronic kidney disease, or unspecified chronic kidney disease: Secondary | ICD-10-CM | POA: Diagnosis present

## 2023-07-08 DIAGNOSIS — Z9911 Dependence on respirator [ventilator] status: Secondary | ICD-10-CM | POA: Diagnosis not present

## 2023-07-08 DIAGNOSIS — G931 Anoxic brain damage, not elsewhere classified: Secondary | ICD-10-CM

## 2023-07-08 DIAGNOSIS — Z87891 Personal history of nicotine dependence: Secondary | ICD-10-CM

## 2023-07-08 DIAGNOSIS — Z833 Family history of diabetes mellitus: Secondary | ICD-10-CM

## 2023-07-08 DIAGNOSIS — E1122 Type 2 diabetes mellitus with diabetic chronic kidney disease: Secondary | ICD-10-CM | POA: Diagnosis present

## 2023-07-08 DIAGNOSIS — I472 Ventricular tachycardia, unspecified: Principal | ICD-10-CM | POA: Diagnosis present

## 2023-07-08 DIAGNOSIS — E782 Mixed hyperlipidemia: Secondary | ICD-10-CM | POA: Diagnosis present

## 2023-07-08 DIAGNOSIS — N1832 Chronic kidney disease, stage 3b: Secondary | ICD-10-CM | POA: Diagnosis present

## 2023-07-08 DIAGNOSIS — M109 Gout, unspecified: Secondary | ICD-10-CM | POA: Diagnosis present

## 2023-07-08 DIAGNOSIS — R579 Shock, unspecified: Secondary | ICD-10-CM | POA: Diagnosis not present

## 2023-07-08 DIAGNOSIS — K219 Gastro-esophageal reflux disease without esophagitis: Secondary | ICD-10-CM | POA: Diagnosis present

## 2023-07-08 DIAGNOSIS — I462 Cardiac arrest due to underlying cardiac condition: Secondary | ICD-10-CM | POA: Diagnosis present

## 2023-07-08 DIAGNOSIS — E876 Hypokalemia: Secondary | ICD-10-CM | POA: Diagnosis present

## 2023-07-08 DIAGNOSIS — Z7982 Long term (current) use of aspirin: Secondary | ICD-10-CM

## 2023-07-08 DIAGNOSIS — R008 Other abnormalities of heart beat: Secondary | ICD-10-CM | POA: Diagnosis not present

## 2023-07-08 DIAGNOSIS — I428 Other cardiomyopathies: Secondary | ICD-10-CM | POA: Diagnosis present

## 2023-07-08 DIAGNOSIS — D649 Anemia, unspecified: Secondary | ICD-10-CM | POA: Diagnosis present

## 2023-07-08 DIAGNOSIS — J9600 Acute respiratory failure, unspecified whether with hypoxia or hypercapnia: Secondary | ICD-10-CM | POA: Diagnosis not present

## 2023-07-08 DIAGNOSIS — I2510 Atherosclerotic heart disease of native coronary artery without angina pectoris: Secondary | ICD-10-CM | POA: Diagnosis present

## 2023-07-08 DIAGNOSIS — E872 Acidosis, unspecified: Secondary | ICD-10-CM | POA: Diagnosis present

## 2023-07-08 DIAGNOSIS — H9193 Unspecified hearing loss, bilateral: Secondary | ICD-10-CM | POA: Diagnosis present

## 2023-07-08 DIAGNOSIS — Z79899 Other long term (current) drug therapy: Secondary | ICD-10-CM | POA: Diagnosis not present

## 2023-07-08 DIAGNOSIS — Z7984 Long term (current) use of oral hypoglycemic drugs: Secondary | ICD-10-CM

## 2023-07-08 DIAGNOSIS — G40901 Epilepsy, unspecified, not intractable, with status epilepticus: Secondary | ICD-10-CM | POA: Diagnosis present

## 2023-07-08 DIAGNOSIS — J9601 Acute respiratory failure with hypoxia: Secondary | ICD-10-CM | POA: Diagnosis present

## 2023-07-08 DIAGNOSIS — Z66 Do not resuscitate: Secondary | ICD-10-CM | POA: Diagnosis present

## 2023-07-08 DIAGNOSIS — Z515 Encounter for palliative care: Secondary | ICD-10-CM

## 2023-07-08 DIAGNOSIS — I447 Left bundle-branch block, unspecified: Secondary | ICD-10-CM | POA: Diagnosis present

## 2023-07-08 DIAGNOSIS — Z8249 Family history of ischemic heart disease and other diseases of the circulatory system: Secondary | ICD-10-CM

## 2023-07-08 DIAGNOSIS — Z953 Presence of xenogenic heart valve: Secondary | ICD-10-CM

## 2023-07-08 DIAGNOSIS — I5022 Chronic systolic (congestive) heart failure: Secondary | ICD-10-CM

## 2023-07-08 DIAGNOSIS — N179 Acute kidney failure, unspecified: Secondary | ICD-10-CM | POA: Diagnosis not present

## 2023-07-08 DIAGNOSIS — M47812 Spondylosis without myelopathy or radiculopathy, cervical region: Secondary | ICD-10-CM | POA: Diagnosis present

## 2023-07-08 DIAGNOSIS — Z91041 Radiographic dye allergy status: Secondary | ICD-10-CM

## 2023-07-08 DIAGNOSIS — Z955 Presence of coronary angioplasty implant and graft: Secondary | ICD-10-CM

## 2023-07-08 DIAGNOSIS — Z981 Arthrodesis status: Secondary | ICD-10-CM

## 2023-07-08 DIAGNOSIS — R569 Unspecified convulsions: Secondary | ICD-10-CM | POA: Diagnosis not present

## 2023-07-08 LAB — CBC
HCT: 43.9 % (ref 39.0–52.0)
Hemoglobin: 13.6 g/dL (ref 13.0–17.0)
MCH: 29.7 pg (ref 26.0–34.0)
MCHC: 31 g/dL (ref 30.0–36.0)
MCV: 95.9 fL (ref 80.0–100.0)
Platelets: 291 10*3/uL (ref 150–400)
RBC: 4.58 MIL/uL (ref 4.22–5.81)
RDW: 12.7 % (ref 11.5–15.5)
WBC: 13.1 10*3/uL — ABNORMAL HIGH (ref 4.0–10.5)
nRBC: 0 % (ref 0.0–0.2)

## 2023-07-08 LAB — I-STAT VENOUS BLOOD GAS, ED
Acid-base deficit: 11 mmol/L — ABNORMAL HIGH (ref 0.0–2.0)
Bicarbonate: 16.8 mmol/L — ABNORMAL LOW (ref 20.0–28.0)
Calcium, Ion: 1.02 mmol/L — ABNORMAL LOW (ref 1.15–1.40)
HCT: 42 % (ref 39.0–52.0)
Hemoglobin: 14.3 g/dL (ref 13.0–17.0)
O2 Saturation: 90 %
Potassium: 3.2 mmol/L — ABNORMAL LOW (ref 3.5–5.1)
Sodium: 139 mmol/L (ref 135–145)
TCO2: 18 mmol/L — ABNORMAL LOW (ref 22–32)
pCO2, Ven: 43.4 mm[Hg] — ABNORMAL LOW (ref 44–60)
pH, Ven: 7.196 — CL (ref 7.25–7.43)
pO2, Ven: 72 mm[Hg] — ABNORMAL HIGH (ref 32–45)

## 2023-07-08 LAB — I-STAT ARTERIAL BLOOD GAS, ED
Acid-base deficit: 9 mmol/L — ABNORMAL HIGH (ref 0.0–2.0)
Bicarbonate: 18.1 mmol/L — ABNORMAL LOW (ref 20.0–28.0)
Calcium, Ion: 1.11 mmol/L — ABNORMAL LOW (ref 1.15–1.40)
HCT: 42 % (ref 39.0–52.0)
Hemoglobin: 14.3 g/dL (ref 13.0–17.0)
O2 Saturation: 100 %
Patient temperature: 33.9
Potassium: 3.9 mmol/L (ref 3.5–5.1)
Sodium: 139 mmol/L (ref 135–145)
TCO2: 19 mmol/L — ABNORMAL LOW (ref 22–32)
pCO2 arterial: 37.6 mm[Hg] (ref 32–48)
pH, Arterial: 7.273 — ABNORMAL LOW (ref 7.35–7.45)
pO2, Arterial: 479 mm[Hg] — ABNORMAL HIGH (ref 83–108)

## 2023-07-08 LAB — I-STAT CHEM 8, ED
BUN: 17 mg/dL (ref 8–23)
Calcium, Ion: 1.02 mmol/L — ABNORMAL LOW (ref 1.15–1.40)
Chloride: 105 mmol/L (ref 98–111)
Creatinine, Ser: 1.3 mg/dL — ABNORMAL HIGH (ref 0.61–1.24)
Glucose, Bld: 322 mg/dL — ABNORMAL HIGH (ref 70–99)
HCT: 42 % (ref 39.0–52.0)
Hemoglobin: 14.3 g/dL (ref 13.0–17.0)
Potassium: 3.2 mmol/L — ABNORMAL LOW (ref 3.5–5.1)
Sodium: 140 mmol/L (ref 135–145)
TCO2: 18 mmol/L — ABNORMAL LOW (ref 22–32)

## 2023-07-08 LAB — BASIC METABOLIC PANEL
Anion gap: 18 — ABNORMAL HIGH (ref 5–15)
BUN: 15 mg/dL (ref 8–23)
CO2: 17 mmol/L — ABNORMAL LOW (ref 22–32)
Calcium: 7.7 mg/dL — ABNORMAL LOW (ref 8.9–10.3)
Chloride: 103 mmol/L (ref 98–111)
Creatinine, Ser: 1.39 mg/dL — ABNORMAL HIGH (ref 0.61–1.24)
GFR, Estimated: 51 mL/min — ABNORMAL LOW (ref >60)
Glucose, Bld: 320 mg/dL — ABNORMAL HIGH (ref 70–99)
Potassium: 3.2 mmol/L — ABNORMAL LOW (ref 3.5–5.1)
Sodium: 138 mmol/L (ref 135–145)

## 2023-07-08 LAB — I-STAT CG4 LACTIC ACID, ED: Lactic Acid, Venous: 6.7 mmol/L (ref 0.5–1.9)

## 2023-07-08 LAB — TROPONIN I (HIGH SENSITIVITY): Troponin I (High Sensitivity): 562 ng/L (ref <18)

## 2023-07-08 MED ORDER — PANTOPRAZOLE SODIUM 40 MG IV SOLR
40.0000 mg | INTRAVENOUS | Status: DC
  Administered 2023-07-09 – 2023-07-12 (×5): 40 mg via INTRAVENOUS
  Filled 2023-07-08 (×5): qty 10

## 2023-07-08 MED ORDER — ACETAMINOPHEN 325 MG PO TABS: 650.0000 mg | ORAL_TABLET | ORAL | Status: DC | PRN

## 2023-07-08 MED ORDER — MIDAZOLAM HCL 2 MG/2ML IJ SOLN
1.0000 mg | INTRAMUSCULAR | Status: DC | PRN
  Administered 2023-07-08: 2 mg via INTRAVENOUS
  Filled 2023-07-08: qty 2

## 2023-07-08 MED ORDER — FENTANYL 2500MCG IN NS 250ML (10MCG/ML) PREMIX INFUSION
0.0000 ug/h | INTRAVENOUS | Status: DC
  Administered 2023-07-08: 150 ug/h via INTRAVENOUS
  Administered 2023-07-08: 35 ug/h via INTRAVENOUS
  Filled 2023-07-08: qty 250

## 2023-07-08 MED ORDER — HEPARIN SODIUM (PORCINE) 5000 UNIT/ML IJ SOLN: 5000.0000 [IU] | Freq: Three times a day (TID) | INTRAMUSCULAR | Status: DC

## 2023-07-08 MED ORDER — DOCUSATE SODIUM 50 MG/5ML PO LIQD: 100.0000 mg | Freq: Two times a day (BID) | ORAL | Status: DC | PRN

## 2023-07-08 MED ORDER — FAMOTIDINE 20 MG PO TABS: 20.0000 mg | ORAL_TABLET | ORAL | Status: DC

## 2023-07-08 MED ORDER — DOCUSATE SODIUM 50 MG/5ML PO LIQD
100.0000 mg | Freq: Two times a day (BID) | ORAL | Status: DC
  Administered 2023-07-09 – 2023-07-13 (×8): 100 mg
  Filled 2023-07-08 (×8): qty 10

## 2023-07-08 MED ORDER — ATORVASTATIN CALCIUM 40 MG PO TABS: 40.0000 mg | ORAL_TABLET | Freq: Every day | ORAL | Status: DC

## 2023-07-08 MED ORDER — POTASSIUM CHLORIDE 10 MEQ/100ML IV SOLN
10.0000 meq | INTRAVENOUS | Status: AC
  Administered 2023-07-09 (×4): 10 meq via INTRAVENOUS
  Filled 2023-07-08 (×4): qty 100

## 2023-07-08 MED ORDER — FENTANYL CITRATE PF 50 MCG/ML IJ SOSY
25.0000 ug | PREFILLED_SYRINGE | INTRAMUSCULAR | Status: DC | PRN
  Administered 2023-07-09: 100 ug via INTRAVENOUS

## 2023-07-08 MED ORDER — INSULIN ASPART 100 UNIT/ML IJ SOLN
0.0000 [IU] | INTRAMUSCULAR | Status: DC
  Administered 2023-07-09: 3 [IU] via SUBCUTANEOUS
  Administered 2023-07-09: 8 [IU] via SUBCUTANEOUS
  Administered 2023-07-09: 2 [IU] via SUBCUTANEOUS
  Administered 2023-07-09: 3 [IU] via SUBCUTANEOUS
  Administered 2023-07-10 – 2023-07-11 (×8): 2 [IU] via SUBCUTANEOUS
  Administered 2023-07-11: 3 [IU] via SUBCUTANEOUS
  Administered 2023-07-12: 2 [IU] via SUBCUTANEOUS
  Administered 2023-07-12 – 2023-07-13 (×6): 3 [IU] via SUBCUTANEOUS

## 2023-07-08 MED ORDER — FENTANYL CITRATE PF 50 MCG/ML IJ SOSY: 25.0000 ug | PREFILLED_SYRINGE | INTRAMUSCULAR | Status: DC | PRN

## 2023-07-08 MED ORDER — ASPIRIN 325 MG PO TABS: 325.0000 mg | ORAL_TABLET | Freq: Every day | ORAL | Status: DC

## 2023-07-08 MED ORDER — POLYETHYLENE GLYCOL 3350 17 G PO PACK: 17.0000 g | PACK | Freq: Every day | ORAL | Status: DC | PRN

## 2023-07-08 MED ORDER — MAGNESIUM SULFATE 2 GM/50ML IV SOLN
2.0000 g | Freq: Once | INTRAVENOUS | Status: AC
  Administered 2023-07-09: 2 g via INTRAVENOUS
  Filled 2023-07-08: qty 50

## 2023-07-08 MED ORDER — POLYETHYLENE GLYCOL 3350 17 G PO PACK
17.0000 g | PACK | Freq: Every day | ORAL | Status: DC
  Administered 2023-07-10 – 2023-07-13 (×4): 17 g
  Filled 2023-07-08 (×4): qty 1

## 2023-07-08 MED ORDER — POTASSIUM CHLORIDE 20 MEQ PO PACK
20.0000 meq | PACK | Freq: Once | ORAL | Status: DC
  Filled 2023-07-08: qty 1

## 2023-07-08 MED ORDER — INSULIN ASPART 100 UNIT/ML IJ SOLN: 0.0000 [IU] | INTRAMUSCULAR | Status: DC

## 2023-07-08 NOTE — H&P (Cosign Needed Addendum)
NAME:  Andre Jimenez, MRN:  [id number], DOB:  1941/09/21, LOS: 0 ADMISSION DATE:  07/03/2023, CONSULTATION DATE:  10/7 REFERRING MD:  Dr. Ledon Snare, CHIEF COMPLAINT:  VT arrest   History of Present Illness:  Patient is a 82 yo M w/ pertinent PMH systolic chf, Aortic stenosis s/p TAVR 01/2020, LBBB, CAD, htn, t2dm, ckd stage 3a presents to [facility] ED on 10/7 for VT arrest.  On 10/7 patient doing well during the day. Wife found him unresponsive in yard and called EMS and started CPR for about 12 minutes by wife until ems arrived. Initial rhythm vtach and was shocked. Patient had multiple round of cpr with intermittent ROSC about 4 times. Patient was shocked 2 times during cpr and intubated. Estimated total ROSC about 20 minutes. Unresponsive post rosc and transported to [facility] ED.   On arrival BP stable. EKG w/ LBBB and no ST elevation which is similar to previous EKG. Bedside echo w/ EF about 30% and no evidence of R heart strain. Patient intubated and sats stable on vent. CXR no significant findings. Remains unresponsive. CT head no acute abnormality. CT chest/abd/pelvis no acute findings. VBG ph 7.19 and co2 43. LA 6.7. K 3.2. Glucose 320. Trop 562. Cards consulted. PCCM consulted for icu admission.   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 rate 18, SpO2 100%.    Vent Mode: PRVC FiO2 (%):  [40 %-100 %] 40 % Set Rate:  [20 bmp-22 bmp] 20 bmp Vt Set:  [440 mL] 440 mL PEEP:  [5 cmH20] 5 cmH20 Plateau Pressure:  [14 cmH20] 14 cmH20  No intake or output data in the 24 hours ending 07/28/2023 2249 There were no vitals filed for this visit.  Examination: General:  critically ill appearing on mech vent HEENT: MM pink/moist; ETT in place Neuro: unresponsive; pupils 2mm sluggish b/l; no cough/gag CV: s1s2, brady 50s, no m/r/g PULM:  dim clear BS bilaterally; on mech vent PRVC GI: soft, bsx4 active  Extremities: warm/dry, no edema  Skin: no rashes or lesions appreciated   Resolved Hospital Problem list     Assessment & Plan:  Vtach arrest Hypokalemia Plan: -will admit to ICU w/ continuous telemetry monitoring -cards consulted; appreciate recs -bp currently stable; MAP goal >65 -trend troponin and lactate -consider heparin drip if troponin continues to rise -replete K and check mag; trend and replete electrolytes as needed -RR increased on vent given acidosis -echo -TTM normothermia protocol in place  Acute respiratory failure due to above Plan: -ETT  7 cm above carina; advance 3 cm -LTVV strategy with tidal volumes of 6-8 cc/kg ideal body weight -increased RR to 28 given metabolic acidosis -Wean PEEP/FiO2 for SpO2 >92% -VAP bundle in place -Daily SAT and SBT -PAD protocol in place -wean sedation for RASS goal 0 to -1 -Follow intermittent CXR and ABG PRN  Acute encephalopathy: post arrest; concern for anoxic injury -CT head negative for acute abnormality; multiple remote infarcts Plan: -limit sedating meds -EEG -if not improved in next 48-72 hours consider MRI  Acute on chronic systolic  chf Aortic stenosis s/p TAVR 01/2020 Hx of LBBB CAD Htn Plan: -cards consulted; appreciate recs -echo -hold home GDMT while normotensive -resume asa -consider resuming statin in am if LFTs stable  T2dm Plan: -ssi and cbg monitoring -a1c  AGMA LA CKD stage 3a Plan: -trend LA -Trend BMP / urinary output -Replace electrolytes as indicated -Avoid nephrotoxic agents, ensure adequate renal perfusion  Leukocytosis Plan: -likely reactive in setting of arrest -CXR no significant findings -check ua -trend wbc/fever curve   Best Practice (right click and "Reselect all SmartList Selections" daily)   Diet/type: NPO w/ meds via tube DVT prophylaxis: prophylactic heparin  GI prophylaxis: PPI Lines: N/A Foley:  Yes, and it is still needed Code Status:  full code Last date of multidisciplinary goals of care discussion [10/7 updated family at bedside]  Labs   CBC: Recent Labs  Lab 07/29/2023 2102 07/21/2023 2108 07/24/2023 2221  WBC 13.1*  --   --   HGB 13.6 14.3  14.3 14.3  HCT 43.9 42.0  42.0 42.0  MCV 95.9  --   --   PLT 291  --   --     Basic Metabolic Panel: Recent Labs  Lab 07/04/23 1146 07/09/2023 2102 07/27/2023 2108 07/20/2023 2221  NA 144 138 140  139 139  K 4.3 3.2* 3.2*  3.2* 3.9  CL 101 103 105  --   CO2 24 17*  --   --   GLUCOSE 114* 320* 322*  --   BUN 20 15 17   --   CREATININE 1.20 1.39* 1.30*  --   CALCIUM 9.1 7.7*  --   --    GFR: CrCl cannot be calculated (Unknown ideal weight.). Recent Labs  Lab 07/09/2023 2102 07/06/2023 2108  WBC 13.1*  --   LATICACIDVEN  --  6.7*    Liver Function Tests: Recent Labs  Lab 07/04/23 1146  AST 16  ALT 8  ALKPHOS 81  BILITOT 0.6  PROT 7.0  ALBUMIN 3.9   No results for input(s): "LIPASE", "AMYLASE" in the last 168 hours. No results for input(s): "AMMONIA" in the last 168 hours.  ABG    Component Value Date/Time   PHART 7.273 (L) 07/18/2023 2221   PCO2ART 37.6 07/24/2023 2221   PO2ART 479 (H)  07/18/2023 2221   HCO3 18.1 (L) 07/12/2023 2221   TCO2 19 (L) 07/03/2023 2221   ACIDBASEDEF 9.0 (H) 07/07/2023 2221   O2SAT 100 07/15/2023 2221     Coagulation Profile: No results for input(s): "INR", "PROTIME" in the last 168 hours.  Cardiac Enzymes: No results for input(s): "CKTOTAL", "CKMB", "CKMBINDEX", "TROPONINI" in the last 168 hours.  HbA1C: Hgb A1c MFr Bld  Date/Time Value Ref Range Status  02/05/2020 09:40 AM 6.7 (H) 4.8 - 5.6 % Final    Comment:    (NOTE) Pre diabetes:          5.7%-6.4% Diabetes:              >  6.4% Glycemic control for   <7.0% adults with diabetes     CBG: No results for input(s): "GLUCAP" in the last 168 hours.  Review of Systems:   Patient is encephalopathic and/or intubated; therefore, history has been obtained from chart review.    Past Medical History:  He,  has a past medical history of Acute on chronic diastolic heart failure (HCC) (10/31/8655), Arthritis, Bilateral hearing loss, Carotid artery disease (HCC), Carpal tunnel syndrome on left, Coronary artery disease, Essential hypertension, GERD (gastroesophageal reflux disease), Gout, Mixed hyperlipidemia, S/P TAVR (transcatheter aortic valve replacement) (02/09/2020), Severe aortic stenosis, Type II diabetes mellitus (HCC), and Vitamin D deficiency.   Surgical History:   Past Surgical History:  Procedure Laterality Date   CARPAL TUNNEL RELEASE Bilateral    CERVICAL FUSION     CORONARY PRESSURE/FFR STUDY N/A 01/28/2020   Procedure: INTRAVASCULAR PRESSURE WIRE/FFR STUDY;  Surgeon: Tonny Bollman, MD;  Location: [facility] INVASIVE CV LAB;  Service: Cardiovascular;  Laterality: N/A;   CORONARY STENT INTERVENTION N/A 01/28/2020   Procedure: CORONARY STENT INTERVENTION;  Surgeon: Tonny Bollman, MD;  Location: [facility] INVASIVE CV LAB;  Service: Cardiovascular;  Laterality: N/A;   ESOPHAGOGASTRODUODENOSCOPY (EGD) WITH ESOPHAGEAL DILATION     LUMBAR FUSION     RIGHT/LEFT HEART CATH AND CORONARY  ANGIOGRAPHY N/A 03/01/2022   Procedure: RIGHT/LEFT HEART CATH AND CORONARY ANGIOGRAPHY;  Surgeon: Laurey Morale, MD;  Location: [facility] INVASIVE CV LAB;  Service: Cardiovascular;  Laterality: N/A;   Rotar cuff repair Right    TEE WITHOUT CARDIOVERSION N/A 02/09/2020   Procedure: TRANSESOPHAGEAL ECHOCARDIOGRAM (TEE);  Surgeon: Tonny Bollman, MD;  Location: [facility] INVASIVE CV LAB;  Service: Open Heart Surgery;  Laterality: N/A;   TRANSCATHETER AORTIC VALVE REPLACEMENT, TRANSFEMORAL Left 02/09/2020   Procedure: TRANSCATHETER AORTIC VALVE REPLACEMENT, LEFT TRANSFEMORAL;  Surgeon: Tonny Bollman, MD;  Location: [facility] INVASIVE CV LAB;  Service: Open Heart Surgery;  Laterality: Left;     Social History:   reports that he has quit smoking. His smoking use included cigars. He quit smokeless tobacco use about 4 years ago. He reports that he does not currently use alcohol. He reports that he does not currently use drugs.   Family History:  His family history includes Diabetes in his mother; Heart disease in his father; Hypertension in his father.   Allergies Allergies  Allergen Reactions   Ivp Dye [Iodinated Contrast Media] Anaphylaxis    Had anaphylactic reaction after TAVR- potentially related to contrast dye   Protamine Anaphylaxis    Had potential anaphylactic shock after protamine administration after TAVR     Home Medications  Prior to Admission medications   Medication Sig Start Date End Date Taking? Authorizing Provider  acetaminophen (TYLENOL) 500 MG tablet Take 1,000 mg by mouth every 6 (six) hours as needed for moderate pain (Back pain).    [provider]  aspirin EC 81 MG tablet Take 81 mg by mouth daily.    [provider]  atorvastatin (LIPITOR) 40 MG tablet Take 1 tablet (40 mg total) by mouth daily. 01/28/20   Marjie Skiff E, PA-C  carvedilol (COREG) 3.125 MG tablet Take 1 tablet (3.125 mg total) by mouth 2 (two) times daily. 04/26/22   Laurey Morale, MD  colchicine  0.6 MG tablet Take 2 tablets by mouth as needed. For gout attacks 03/01/20   [provider]  empagliflozin (JARDIANCE) 25 MG TABS tablet Take 12.5 mg (1/2 tablet) daily. 04/05/22   Laurey Morale, MD  Fluocinolone Acetonide 0.01 %  OIL Place 1 drop in ear(s) daily as needed (itching).    [provider]  furosemide (LASIX) 40 MG tablet Take 0.5 tablets (20 mg total) by mouth daily. 04/25/22   Laurey Morale, MD  MAGnesium-Oxide 400 (240 Mg) MG tablet Take 1 tablet (400 mg total) by mouth 2 (two) times daily. 12/11/22   Laurey Morale, MD  metFORMIN (GLUCOPHAGE) 1000 MG tablet Take 1,000 mg by mouth 2 (two) times daily with a meal.    [provider]  Multiple Vitamins-Minerals (PRESERVISION AREDS 2+MULTI VIT) CAPS Take 1 capsule by mouth daily.    [provider]  pantoprazole (PROTONIX) 40 MG tablet Take 1 tablet (40 mg total) by mouth daily. 01/25/20   Tonny Bollman, MD  sacubitril-valsartan (ENTRESTO) 97-103 MG Take 1 tablet by mouth 2 (two) times daily. 06/21/22   Laurey Morale, MD  spironolactone (ALDACTONE) 25 MG tablet Take 1 tablet (25 mg total) by mouth daily. 09/03/22   Laurey Morale, MD     Critical care time: 50 minutes    JD Daryel [facility] Aug 05, 2023, 10:49 PM  Please see [email or web address] for pager details.  From 7A-7P if no response, please call [phone]. After hours, please call ELink [phone].

## 2023-07-08 NOTE — ED Triage Notes (Signed)
Pt was an unwitnessed post arrest, wife heard a thud in the other room in which she found her husband unresponsive. She tried to [employer] with a potential downtime of 5 minutes. Fire arrived first and initiated bls. Medic arrived and started acls  Medic timeline of events  Vtach Shock delivered X3cpr X1 epi X1 ldio Rearrest in transport Cpr X1 epi X1 lido Rearrest  Cpr X1 epi X1 lido Cardiovert x 2 at 200j Rearrest  X1 epi X1 lido   Bilateral tibial IO 25mm Lido drip running ag 2mg /min Ett in place 7 at the lips

## 2023-07-08 NOTE — ED Provider Notes (Cosign Needed Addendum)
[facility] DEPARTMENT AT [facility] Provider Note   CSN: [id number] Arrival date & time: 07/10/2023  2100     History  Chief Complaint  Patient presents with   Cardiac Arrest    Andre Jimenez is a 82 y.o. male.   Cardiac Arrest  82 year old male with past medical history of diastolic heart failure, aortic valve dysfunction status post TAVR, CAD, hypertension, hyperlipidemia, type 2 diabetes presenting as postarrest.  History is provided by family following initial resuscitation.  They state that patient was in his normal state of health today walking and ambulating, eating dinner with family.  This evening, wife found him down in the yard and attempted to give CPR.  She thinks this lasted approximately 5 minutes before flare and EMS services arrived.  EMS provides remainder of history.  Patient had numerous recurrent cardiac arrest, estimated to have arrested 4 times.  They were able to achieve ROSC with multiple rounds of epinephrine, lidocaine.  They also cardioverted the patient on 2 separate occasions.  Endotracheal tube was placed during these events without any type of paralysis or sedation.  Patient had no spontaneous movement with EMS since this occurred.     Home Medications Prior to Admission medications   Medication Sig Start Date End Date Taking? Authorizing Provider  acetaminophen (TYLENOL) 500 MG tablet Take 1,000 mg by mouth every 6 (six) hours as needed for moderate pain (Back pain).    [provider]  aspirin EC 81 MG tablet Take 81 mg by mouth daily.    [provider]  atorvastatin (LIPITOR) 40 MG tablet Take 1 tablet (40 mg total) by mouth daily. 01/28/20   Marjie Skiff E, PA-C  carvedilol (COREG) 3.125 MG tablet Take 1 tablet (3.125 mg total) by mouth 2 (two) times daily. 04/26/22   Laurey Morale, MD  colchicine 0.6 MG tablet Take 2 tablets by mouth as needed. For gout attacks 03/01/20   [provider]   empagliflozin (JARDIANCE) 25 MG TABS tablet Take 12.5 mg (1/2 tablet) daily. 04/05/22   Laurey Morale, MD  Fluocinolone Acetonide 0.01 % OIL Place 1 drop in ear(s) daily as needed (itching).    [provider]  furosemide (LASIX) 40 MG tablet Take 0.5 tablets (20 mg total) by mouth daily. 04/25/22   Laurey Morale, MD  MAGnesium-Oxide 400 (240 Mg) MG tablet Take 1 tablet (400 mg total) by mouth 2 (two) times daily. 12/11/22   Laurey Morale, MD  metFORMIN (GLUCOPHAGE) 1000 MG tablet Take 1,000 mg by mouth 2 (two) times daily with a meal.    [provider]  Multiple Vitamins-Minerals (PRESERVISION AREDS 2+MULTI VIT) CAPS Take 1 capsule by mouth daily.    [provider]  pantoprazole (PROTONIX) 40 MG tablet Take 1 tablet (40 mg total) by mouth daily. 01/25/20   Tonny Bollman, MD  sacubitril-valsartan (ENTRESTO) 97-103 MG Take 1 tablet by mouth 2 (two) times daily. 06/21/22   Laurey Morale, MD  spironolactone (ALDACTONE) 25 MG tablet Take 1 tablet (25 mg total) by mouth daily. 09/03/22   Laurey Morale, MD      Allergies    Ivp dye [iodinated contrast media] and Protamine    Review of Systems   Review of Systems  Physical Exam Updated Vital Signs BP 132/75   Pulse 74   Temp (!) 93 F (33.9 C)   Resp 18   SpO2 100%  Physical Exam Constitutional:  General: He is in acute distress.     Appearance: He is ill-appearing.  HENT:     Head: Normocephalic.     Mouth/Throat:     Mouth: Mucous membranes are moist.  Eyes:     Comments: Pupils 2 mm, nonreactive bilaterally  Cardiovascular:     Rate and Rhythm: Normal rate and regular rhythm.  Pulmonary:     Comments: Mechanically ventilated Abdominal:     General: Abdomen is flat.     Tenderness: There is no guarding or rebound.  Musculoskeletal:     Right lower leg: No edema.     Left lower leg: No edema.  Skin:    General: Skin is warm and dry.     Findings: No rash.  Neurological:      Comments: Nonresponsive.  No conscious movement.  Does have slight moving of the jaw.  Does not withdraw to pain.     ED Results / Procedures / Treatments   Labs (all labs ordered are listed, but only abnormal results are displayed) Labs Reviewed  BASIC METABOLIC PANEL - Abnormal; Notable for the following components:      Result Value   Potassium 3.2 (*)    CO2 17 (*)    Glucose, Bld 320 (*)    Creatinine, Ser 1.39 (*)    Calcium 7.7 (*)    GFR, Estimated 51 (*)    Anion gap 18 (*)    All other components within normal limits  CBC - Abnormal; Notable for the following components:   WBC 13.1 (*)    All other components within normal limits  I-STAT CHEM 8, ED - Abnormal; Notable for the following components:   Potassium 3.2 (*)    Creatinine, Ser 1.30 (*)    Glucose, Bld 322 (*)    Calcium, Ion 1.02 (*)    TCO2 18 (*)    All other components within normal limits  I-STAT CG4 LACTIC ACID, ED - Abnormal; Notable for the following components:   Lactic Acid, Venous 6.7 (*)    All other components within normal limits  I-STAT VENOUS BLOOD GAS, ED - Abnormal; Notable for the following components:   pH, Ven 7.196 (*)    pCO2, Ven 43.4 (*)    pO2, Ven 72 (*)    Bicarbonate 16.8 (*)    TCO2 18 (*)    Acid-base deficit 11.0 (*)    Potassium 3.2 (*)    Calcium, Ion 1.02 (*)    All other components within normal limits  TROPONIN I (HIGH SENSITIVITY) - Abnormal; Notable for the following components:   Troponin I (High Sensitivity) 562 (*)    All other components within normal limits  BLOOD GAS, ARTERIAL    EKG EKG Interpretation Date/Time:  Monday 2023-07-26 21:01:00 EDT Ventricular Rate:  73 PR Interval:  231 QRS Duration:  174 QT Interval:  443 QTC Calculation: 489 R Axis:   51  Text Interpretation: Sinus rhythm Prolonged PR interval Non-specific intra-ventricular conduction delay Abnormal ECG Confirmed by Gerhard Munch [id number]) on 2023/07/26 9:20:11  PM  Radiology DG Chest Portable 1 View  Result Date: 2023/07/26 CLINICAL DATA:  Respiratory failure EXAM: PORTABLE CHEST 1 VIEW COMPARISON:  02/08/2022 FINDINGS: Endotracheal tube seen 7.2 cm above the carina with its tip at the level of the clavicular heads. Biapical opacity related to hypertrophic changes of the first costochondral junctions. Lungs are clear. No pneumothorax or pleural effusion. Cardiac size is within normal limits. Transcatheter aortic valve replacement  has been performed. Nasogastric tube extends into the upper abdomen beyond the margin of the examination. Pulmonary vascularity is normal. No acute bone abnormality. IMPRESSION: 1. Endotracheal tube 7.2 cm above the carina. 2. No focal pulmonary infiltrate. Electronically Signed   By: Helyn Numbers M.D.   On: 2023/07/28 22:00    Procedures Procedures    Medications Ordered in ED Medications  midazolam (VERSED) injection 1-2 mg (2 mg Intravenous Given 07/28/2023 2129)  fentaNYL (SUBLIMAZE) injection 25 mcg (has no administration in time range)  fentaNYL (SUBLIMAZE) injection 25-100 mcg (has no administration in time range)  fentaNYL in NS (56mcg/ml) infusion-PREMIX (150 mcg/hr Intravenous New Bag/Given July 28, 2023 2211)    ED Course/ Medical Decision Making/ A&P                                 Medical Decision Making Amount and/or Complexity of Data Reviewed Labs: ordered. Radiology: ordered.  Risk Prescription drug management. Decision regarding hospitalization.   Andre Jimenez is a 82 y.o. male with PMHX of diastolic heart failure, aortic valve dysfunction status post TAVR, CAD, hypertension, hyperlipidemia, type 2 diabetes  who presents s/p cardiac arrest in the field. Initial rhythm ventricular tachycardia. EMS administered multiple shocks, multiple rounds of epi, multiple rounds of lidocaine.  Please see nursing documentation for full EMS run sheet.  Approximately code time between the four different  arrests estimated to be 20 minutes.  On arrival ETT placement confirmed with equal chest rise, bilateral breath sounds, and adequate SpO2 and end tidal capnography.  Adequate IV access rapidly obtained on arrival.  Manual blood pressure obtained, which demonstrated 123/78.  Defibrillator pads placed with defibrillator ready at bedside. After initial stabilization, broad work-up initiated.   Concern for possible ACS, PE, pericardial effusion/tamponade, head bleed/CVA, intra-abdominal pathology, electrolyte derangement, drug-induced, among others as the precipitating event.    Initial EKG showed sinus rhythm with likely left bundle branch block on my independent review.  Does not meet Sgarbossa criteria.  He had a similar EKG morphology from an EKG on 07/04/2023.  Bedside ultrasound showed EF 30%, no evidence of overt right heart strain, no significant pericardial effusion or tamponade physiology on my interpretation.   Initial venous blood gas shows a pH of 7.19 with a CO2 of 43.  Lactic was 6.7.  Potassium was 3.2.     Broad laboratory work-up obtained as well as CT head, CT chest abdomen pelvis (all had to be completed without contrast due to anaphylactic reaction to contrast).  In brief, the CT scans did not show any obvious fracture, infection, other changes that would account for his arrest today.  During my course of care, patient retain a normal heart rate and blood pressure without augmentation.  He received fentanyl and Versed for sedation given concern about dropping blood pressures show to be started on propofol.  Norepinephrine has been at bedside since initial presentation, but has not yet been needed.   Patient discussed with ICU team for admission. Family updated by myself.  They confirmed code status as FULL.  Prior to admission to the ICU, family member was able to obtain a ring camera video of today's events.  On the video, I am able to see the patient initially go unresponsive.  It  was approximately 12 minutes later when EMS services arrived.  This does equate to a much longer downtime than the estimated 5 minutes without CPR.  Final Clinical Impression(s) / ED Diagnoses Final diagnoses:  Cardiac arrest [facility])    Rx / DC Orders ED Discharge Orders     None         Lyman Speller, MD 07/03/2023 2247    Lyman Speller, MD 08/01/2023 2308    Gerhard Munch, MD 07/09/23 2130

## 2023-07-09 ENCOUNTER — Inpatient Hospital Stay (HOSPITAL_COMMUNITY): Payer: No Typology Code available for payment source

## 2023-07-09 ENCOUNTER — Other Ambulatory Visit: Payer: Self-pay

## 2023-07-09 DIAGNOSIS — I5022 Chronic systolic (congestive) heart failure: Secondary | ICD-10-CM | POA: Diagnosis not present

## 2023-07-09 DIAGNOSIS — R579 Shock, unspecified: Secondary | ICD-10-CM

## 2023-07-09 DIAGNOSIS — R008 Other abnormalities of heart beat: Secondary | ICD-10-CM

## 2023-07-09 DIAGNOSIS — J9601 Acute respiratory failure with hypoxia: Secondary | ICD-10-CM

## 2023-07-09 DIAGNOSIS — E872 Acidosis, unspecified: Secondary | ICD-10-CM

## 2023-07-09 DIAGNOSIS — N179 Acute kidney failure, unspecified: Secondary | ICD-10-CM

## 2023-07-09 DIAGNOSIS — I469 Cardiac arrest, cause unspecified: Secondary | ICD-10-CM | POA: Diagnosis not present

## 2023-07-09 DIAGNOSIS — Z9911 Dependence on respirator [ventilator] status: Secondary | ICD-10-CM | POA: Diagnosis not present

## 2023-07-09 LAB — BASIC METABOLIC PANEL
Anion gap: 15 (ref 5–15)
Anion gap: 13 (ref 5–15)
Anion gap: 16 — ABNORMAL HIGH (ref 5–15)
BUN: 19 mg/dL (ref 8–23)
BUN: 20 mg/dL (ref 8–23)
BUN: 24 mg/dL — ABNORMAL HIGH (ref 8–23)
CO2: 17 mmol/L — ABNORMAL LOW (ref 22–32)
CO2: 19 mmol/L — ABNORMAL LOW (ref 22–32)
CO2: 17 mmol/L — ABNORMAL LOW (ref 22–32)
Calcium: 7.8 mg/dL — ABNORMAL LOW (ref 8.9–10.3)
Calcium: 8 mg/dL — ABNORMAL LOW (ref 8.9–10.3)
Calcium: 7.8 mg/dL — ABNORMAL LOW (ref 8.9–10.3)
Chloride: 105 mmol/L (ref 98–111)
Chloride: 107 mmol/L (ref 98–111)
Chloride: 106 mmol/L (ref 98–111)
Creatinine, Ser: 1.43 mg/dL — ABNORMAL HIGH (ref 0.61–1.24)
Creatinine, Ser: 1.4 mg/dL — ABNORMAL HIGH (ref 0.61–1.24)
Creatinine, Ser: 1.57 mg/dL — ABNORMAL HIGH (ref 0.61–1.24)
GFR, Estimated: 49 mL/min — ABNORMAL LOW (ref >60)
GFR, Estimated: 50 mL/min — ABNORMAL LOW (ref >60)
GFR, Estimated: 44 mL/min — ABNORMAL LOW (ref >60)
Glucose, Bld: 261 mg/dL — ABNORMAL HIGH (ref 70–99)
Glucose, Bld: 206 mg/dL — ABNORMAL HIGH (ref 70–99)
Glucose, Bld: 158 mg/dL — ABNORMAL HIGH (ref 70–99)
Potassium: 3.7 mmol/L (ref 3.5–5.1)
Potassium: 3.7 mmol/L (ref 3.5–5.1)
Potassium: 3.8 mmol/L (ref 3.5–5.1)
Sodium: 137 mmol/L (ref 135–145)
Sodium: 139 mmol/L (ref 135–145)
Sodium: 139 mmol/L (ref 135–145)

## 2023-07-09 LAB — ECHOCARDIOGRAM COMPLETE
AR max vel: 3.24 cm2
AV Area VTI: 3.08 cm2
AV Area mean vel: 3.2 cm2
AV Mean grad: 5 mm[Hg]
AV Peak grad: 9.4 mm[Hg]
Ao pk vel: 1.54 m/s
Area-P 1/2: 2.99 cm2
Calc EF: 29.4 %
Height: 70 in
S' Lateral: 5.4 cm
Single Plane A2C EF: 32.7 %
Single Plane A4C EF: 24.4 %
Weight: 2920.65 [oz_av]

## 2023-07-09 LAB — GLUCOSE, CAPILLARY
Glucose-Capillary: 134 mg/dL — ABNORMAL HIGH (ref 70–99)
Glucose-Capillary: 138 mg/dL — ABNORMAL HIGH (ref 70–99)
Glucose-Capillary: 145 mg/dL — ABNORMAL HIGH (ref 70–99)
Glucose-Capillary: 157 mg/dL — ABNORMAL HIGH (ref 70–99)
Glucose-Capillary: 161 mg/dL — ABNORMAL HIGH (ref 70–99)
Glucose-Capillary: 254 mg/dL — ABNORMAL HIGH (ref 70–99)

## 2023-07-09 LAB — CBC
HCT: 38.3 % — ABNORMAL LOW (ref 39.0–52.0)
HCT: 39.2 % (ref 39.0–52.0)
Hemoglobin: 12.3 g/dL — ABNORMAL LOW (ref 13.0–17.0)
Hemoglobin: 12.7 g/dL — ABNORMAL LOW (ref 13.0–17.0)
MCH: 30.4 pg (ref 26.0–34.0)
MCH: 29.8 pg (ref 26.0–34.0)
MCHC: 32.1 g/dL (ref 30.0–36.0)
MCHC: 32.4 g/dL (ref 30.0–36.0)
MCV: 94.8 fL (ref 80.0–100.0)
MCV: 92 fL (ref 80.0–100.0)
Platelets: 279 10*3/uL (ref 150–400)
Platelets: 292 10*3/uL (ref 150–400)
RBC: 4.04 MIL/uL — ABNORMAL LOW (ref 4.22–5.81)
RBC: 4.26 MIL/uL (ref 4.22–5.81)
RDW: 12.8 % (ref 11.5–15.5)
RDW: 12.8 % (ref 11.5–15.5)
WBC: 15.2 10*3/uL — ABNORMAL HIGH (ref 4.0–10.5)
WBC: 18.2 10*3/uL — ABNORMAL HIGH (ref 4.0–10.5)
nRBC: 0 % (ref 0.0–0.2)
nRBC: 0 % (ref 0.0–0.2)

## 2023-07-09 LAB — HEPATIC FUNCTION PANEL
ALT: 67 U/L — ABNORMAL HIGH (ref 0–44)
AST: 64 U/L — ABNORMAL HIGH (ref 15–41)
Albumin: 3.4 g/dL — ABNORMAL LOW (ref 3.5–5.0)
Alkaline Phosphatase: 79 U/L (ref 38–126)
Bilirubin, Direct: 0.1 mg/dL (ref 0.0–0.2)
Indirect Bilirubin: 0.8 mg/dL (ref 0.3–0.9)
Total Bilirubin: 0.9 mg/dL (ref 0.3–1.2)
Total Protein: 6.4 g/dL — ABNORMAL LOW (ref 6.5–8.1)

## 2023-07-09 LAB — COOXEMETRY PANEL
Carboxyhemoglobin: 1.8 % — ABNORMAL HIGH (ref 0.5–1.5)
Methemoglobin: 1.4 % (ref 0.0–1.5)
O2 Saturation: 87.3 %
Total hemoglobin: 11.2 g/dL — ABNORMAL LOW (ref 12.0–16.0)

## 2023-07-09 LAB — HEPARIN LEVEL (UNFRACTIONATED)
Heparin Unfractionated: 0.7 [IU]/mL (ref 0.30–0.70)
Heparin Unfractionated: 0.61 [IU]/mL (ref 0.30–0.70)

## 2023-07-09 LAB — HEMOGLOBIN A1C
Hgb A1c MFr Bld: 7 % — ABNORMAL HIGH (ref 4.8–5.6)
Mean Plasma Glucose: 154.2 mg/dL

## 2023-07-09 LAB — MAGNESIUM
Magnesium: 1.3 mg/dL — ABNORMAL LOW (ref 1.7–2.4)
Magnesium: 2.2 mg/dL (ref 1.7–2.4)

## 2023-07-09 LAB — MRSA NEXT GEN BY PCR, NASAL: MRSA by PCR Next Gen: NOT DETECTED

## 2023-07-09 LAB — PHOSPHORUS: Phosphorus: 2.9 mg/dL (ref 2.5–4.6)

## 2023-07-09 LAB — LACTIC ACID, PLASMA
Lactic Acid, Venous: 3.8 mmol/L (ref 0.5–1.9)
Lactic Acid, Venous: 2.9 mmol/L (ref 0.5–1.9)

## 2023-07-09 LAB — BRAIN NATRIURETIC PEPTIDE: B Natriuretic Peptide: 626.8 pg/mL — ABNORMAL HIGH (ref 0.0–100.0)

## 2023-07-09 LAB — TROPONIN I (HIGH SENSITIVITY): Troponin I (High Sensitivity): 1271 ng/L (ref <18)

## 2023-07-09 MED ORDER — VALPROATE SODIUM 100 MG/ML IV SOLN
1800.0000 mg | Freq: Once | INTRAVENOUS | Status: AC
  Administered 2023-07-09: 1800 mg via INTRAVENOUS
  Filled 2023-07-09: qty 18

## 2023-07-09 MED ORDER — MIDAZOLAM BOLUS VIA INFUSION: 0.2000 mg/kg | INTRAVENOUS | Status: DC | PRN

## 2023-07-09 MED ORDER — LEVETIRACETAM IN NACL 1500 MG/100ML IV SOLN
1500.0000 mg | Freq: Once | INTRAVENOUS | Status: DC
  Filled 2023-07-09: qty 100

## 2023-07-09 MED ORDER — PERFLUTREN LIPID MICROSPHERE
1.0000 mL | INTRAVENOUS | Status: AC | PRN
  Administered 2023-07-09: 2 mL via INTRAVENOUS

## 2023-07-09 MED ORDER — MAGNESIUM SULFATE 4 GM/100ML IV SOLN
4.0000 g | Freq: Once | INTRAVENOUS | Status: AC
  Administered 2023-07-09: 4 g via INTRAVENOUS
  Filled 2023-07-09: qty 100

## 2023-07-09 MED ORDER — VALPROATE SODIUM 100 MG/ML IV SOLN
250.0000 mg | Freq: Four times a day (QID) | INTRAVENOUS | Status: DC
  Administered 2023-07-09 – 2023-07-13 (×16): 250 mg via INTRAVENOUS
  Filled 2023-07-09 (×3): qty 2.5
  Filled 2023-07-09: qty 250
  Filled 2023-07-09 (×2): qty 2.5
  Filled 2023-07-09: qty 250
  Filled 2023-07-09 (×14): qty 2.5
  Filled 2023-07-09 (×2): qty 250
  Filled 2023-07-09: qty 2.5

## 2023-07-09 MED ORDER — PROPOFOL BOLUS VIA INFUSION
50.0000 mg | Freq: Once | INTRAVENOUS | Status: AC
  Administered 2023-07-09: 50 mg via INTRAVENOUS
  Filled 2023-07-09: qty 50

## 2023-07-09 MED ORDER — FUROSEMIDE 10 MG/ML IJ SOLN
40.0000 mg | Freq: Once | INTRAMUSCULAR | Status: AC
  Administered 2023-07-09: 40 mg via INTRAVENOUS
  Filled 2023-07-09: qty 4

## 2023-07-09 MED ORDER — NOREPINEPHRINE 4 MG/250ML-% IV SOLN
2.0000 ug/min | INTRAVENOUS | Status: DC
  Administered 2023-07-09: 10 ug/min via INTRAVENOUS
  Administered 2023-07-09: 9 ug/min via INTRAVENOUS
  Administered 2023-07-09: 2 ug/min via INTRAVENOUS
  Filled 2023-07-09 (×2): qty 250

## 2023-07-09 MED ORDER — MIDAZOLAM BOLUS VIA INFUSION
4.0000 mg | Freq: Once | INTRAVENOUS | Status: AC
  Administered 2023-07-09: 4 mg via INTRAVENOUS

## 2023-07-09 MED ORDER — MIDAZOLAM BOLUS VIA INFUSION: 8.0000 mg | INTRAVENOUS | Status: DC | PRN

## 2023-07-09 MED ORDER — PROPOFOL 1000 MG/100ML IV EMUL
0.0000 ug/kg/min | INTRAVENOUS | Status: DC
  Administered 2023-07-09: 40 ug/kg/min via INTRAVENOUS
  Administered 2023-07-09: 5 ug/kg/min via INTRAVENOUS
  Administered 2023-07-09 – 2023-07-10 (×5): 40 ug/kg/min via INTRAVENOUS
  Administered 2023-07-10: 20 ug/kg/min via INTRAVENOUS
  Administered 2023-07-10: 40 ug/kg/min via INTRAVENOUS
  Administered 2023-07-11: 20 ug/kg/min via INTRAVENOUS
  Administered 2023-07-12: 25 ug/kg/min via INTRAVENOUS
  Administered 2023-07-12: 5 ug/kg/min via INTRAVENOUS
  Administered 2023-07-13 (×2): 30 ug/kg/min via INTRAVENOUS
  Administered 2023-07-13: 40 ug/kg/min via INTRAVENOUS
  Filled 2023-07-09 (×16): qty 100

## 2023-07-09 MED ORDER — SODIUM CHLORIDE 0.9 % IV SOLN: 250.0000 mL | INTRAVENOUS | Status: DC

## 2023-07-09 MED ORDER — SODIUM CHLORIDE 0.9 % IV SOLN
750.0000 mg | Freq: Two times a day (BID) | INTRAVENOUS | Status: DC
  Administered 2023-07-09 – 2023-07-13 (×8): 750 mg via INTRAVENOUS
  Filled 2023-07-09 (×10): qty 7.5

## 2023-07-09 MED ORDER — MIDAZOLAM-SODIUM CHLORIDE 100-0.9 MG/100ML-% IV SOLN
10.0000 mg/h | INTRAVENOUS | Status: DC
  Administered 2023-07-09: 20 mg/h via INTRAVENOUS
  Administered 2023-07-09: 10 mg/h via INTRAVENOUS
  Administered 2023-07-09 – 2023-07-10 (×5): 20 mg/h via INTRAVENOUS
  Filled 2023-07-09 (×7): qty 100

## 2023-07-09 MED ORDER — ASPIRIN 81 MG PO CHEW
81.0000 mg | CHEWABLE_TABLET | Freq: Every day | ORAL | Status: DC
  Administered 2023-07-09 – 2023-07-13 (×5): 81 mg
  Filled 2023-07-09 (×5): qty 1

## 2023-07-09 MED ORDER — MIDAZOLAM HCL 2 MG/2ML IJ SOLN
4.0000 mg | Freq: Once | INTRAMUSCULAR | Status: AC
  Administered 2023-07-09: 4 mg via INTRAVENOUS
  Filled 2023-07-09: qty 4

## 2023-07-09 MED ORDER — MIDAZOLAM HCL 2 MG/2ML IJ SOLN
2.0000 mg | INTRAMUSCULAR | Status: DC | PRN
  Administered 2023-07-09 – 2023-07-12 (×6): 4 mg via INTRAVENOUS
  Administered 2023-07-12 (×3): 2 mg via INTRAVENOUS
  Administered 2023-07-12: 4 mg via INTRAVENOUS
  Administered 2023-07-13: 2 mg via INTRAVENOUS
  Administered 2023-07-13: 4 mg via INTRAVENOUS
  Filled 2023-07-09 (×2): qty 2
  Filled 2023-07-09 (×4): qty 4
  Filled 2023-07-09: qty 2
  Filled 2023-07-09 (×5): qty 4
  Filled 2023-07-09: qty 2
  Filled 2023-07-09 (×3): qty 4

## 2023-07-09 MED ORDER — ORAL CARE MOUTH RINSE: 15.0000 mL | OROMUCOSAL | Status: DC | PRN

## 2023-07-09 MED ORDER — NOREPINEPHRINE 4 MG/250ML-% IV SOLN
0.0000 ug/min | INTRAVENOUS | Status: DC
  Administered 2023-07-10: 16 ug/min via INTRAVENOUS
  Administered 2023-07-10: 15 ug/min via INTRAVENOUS
  Administered 2023-07-10: 8 ug/min via INTRAVENOUS
  Administered 2023-07-10: 15 ug/min via INTRAVENOUS
  Administered 2023-07-11: 6 ug/min via INTRAVENOUS
  Administered 2023-07-11: 3 ug/min via INTRAVENOUS
  Filled 2023-07-09 (×7): qty 250

## 2023-07-09 MED ORDER — SODIUM CHLORIDE 0.9 % IV SOLN
4500.0000 mg | Freq: Once | INTRAVENOUS | Status: AC
  Administered 2023-07-09: 4500 mg via INTRAVENOUS
  Filled 2023-07-09: qty 45

## 2023-07-09 MED ORDER — HEPARIN (PORCINE) 25000 UT/250ML-% IV SOLN
1100.0000 [IU]/h | INTRAVENOUS | Status: DC
  Administered 2023-07-09 – 2023-07-10 (×2): 1000 [IU]/h via INTRAVENOUS
  Administered 2023-07-10: 1100 [IU]/h via INTRAVENOUS
  Filled 2023-07-09 (×3): qty 250

## 2023-07-09 MED ORDER — FENTANYL 2500MCG IN NS 250ML (10MCG/ML) PREMIX INFUSION
25.0000 ug/h | INTRAVENOUS | Status: DC
  Administered 2023-07-09 – 2023-07-11 (×3): 125 ug/h via INTRAVENOUS
  Filled 2023-07-09 (×3): qty 250

## 2023-07-09 MED ORDER — SODIUM CHLORIDE 0.9% FLUSH
10.0000 mL | Freq: Two times a day (BID) | INTRAVENOUS | Status: DC
  Administered 2023-07-09 – 2023-07-11 (×5): 10 mL
  Administered 2023-07-12: 20 mL
  Administered 2023-07-12 – 2023-07-13 (×2): 10 mL

## 2023-07-09 MED ORDER — HEPARIN BOLUS VIA INFUSION
4000.0000 [IU] | Freq: Once | INTRAVENOUS | Status: AC
  Administered 2023-07-09: 4000 [IU] via INTRAVENOUS
  Filled 2023-07-09: qty 4000

## 2023-07-09 MED ORDER — ORAL CARE MOUTH RINSE
15.0000 mL | OROMUCOSAL | Status: DC
  Administered 2023-07-09 – 2023-07-13 (×50): 15 mL via OROMUCOSAL

## 2023-07-09 MED ORDER — SODIUM CHLORIDE 0.9% FLUSH: 10.0000 mL | INTRAVENOUS | Status: DC | PRN

## 2023-07-09 MED ORDER — CHLORHEXIDINE GLUCONATE CLOTH 2 % EX PADS
6.0000 | MEDICATED_PAD | Freq: Every day | CUTANEOUS | Status: DC
  Administered 2023-07-09 – 2023-07-13 (×5): 6 via TOPICAL

## 2023-07-09 NOTE — Progress Notes (Addendum)
LTM EEG hooked up and running - no initial skin breakdown -  Pt has broken skin at O1 placement location, lead placed as close as possible to placement without interfering with broken skin. [facility] monitoring.

## 2023-07-09 NOTE — Progress Notes (Signed)
PHARMACY - ANTICOAGULATION CONSULT NOTE  Pharmacy Consult for heparin Indication: chest pain/ACS  Allergies  Allergen Reactions   Ivp Dye [Iodinated Contrast Media] Anaphylaxis    Had anaphylactic reaction after TAVR- potentially related to contrast dye   Protamine Anaphylaxis    Had potential anaphylactic shock after protamine administration after TAVR    Patient Measurements: Height: 5\' 10"  (177.8 cm) Weight: 82.8 kg (182 lb 8.7 oz) IBW/kg (Calculated) : 73   Vital Signs: Temp: 96.8 F (36 C) (10/08 2100) Temp Source: Bladder (10/08 2000) BP: 88/49 (10/08 2100) Pulse Rate: 63 (10/08 2100)  Labs: Recent Labs    07/06/2023 2102 08/01/2023 2108 07/16/2023 2221 07/09/23 0039 07/09/23 0041 07/09/23 0257 07/09/23 0752 07/09/23 1210 07/09/23 2122  HGB 13.6   < > 14.3  --  12.3* 12.7*  --   --   --   HCT 43.9   < > 42.0  --  38.3* 39.2  --   --   --   PLT 291  --   --   --  279 292  --   --   --   HEPARINUNFRC  --   --   --   --   --   --   --  0.70 0.61  CREATININE 1.39*   < >  --   --  1.43* 1.40* 1.57*  --   --   TROPONINIHS 562*  --   --  1,271*  --   --   --   --   --    < > = values in this interval not displayed.    Estimated Creatinine Clearance: 38.1 mL/min (A) (by C-G formula based on SCr of 1.57 mg/dL (H)).   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      aspirin EC 81 MG tablet Take 81 mg by mouth daily.      atorvastatin (LIPITOR) 40 MG tablet Take 1 tablet (40 mg total) by mouth daily. 30 tablet 3    carvedilol (COREG) 3.125 MG tablet Take 1 tablet (3.125 mg total) by mouth 2 (two) times daily. 180 tablet 3    colchicine 0.6 MG tablet Take 2 tablets by mouth as needed. For gout attacks      empagliflozin (JARDIANCE) 25 MG TABS tablet Take 12.5 mg (1/2 tablet) daily. 45 tablet 3    Fluocinolone Acetonide 0.01 % OIL Place 1 drop in ear(s) daily as needed (itching).      furosemide (LASIX) 40 MG tablet Take 0.5 tablets (20 mg total) by mouth daily. 45 tablet 3  MAGnesium-Oxide 400 (240 Mg) MG tablet Take 1 tablet (400 mg total) by mouth 2 (two) times daily. 60 tablet 11    metFORMIN (GLUCOPHAGE) 1000 MG tablet Take 1,000 mg by mouth 2 (two) times daily with a meal.      Multiple Vitamins-Minerals (PRESERVISION AREDS 2+MULTI VIT) CAPS Take 1 capsule by mouth daily.      pantoprazole (PROTONIX) 40 MG tablet Take 1 tablet (40 mg total) by mouth daily. 90 tablet 3    sacubitril-valsartan (ENTRESTO) 97-103 MG Take 1 tablet by mouth 2 (two) times daily. 180 tablet 3    spironolactone (ALDACTONE) 25 MG tablet Take 1 tablet (25 mg total) by mouth daily. 90 tablet 3    Scheduled:   aspirin  81 mg Per Tube Daily   Chlorhexidine Gluconate Cloth  6 each Topical Daily   docusate  100 mg Per Tube BID   insulin aspart  0-15 Units Subcutaneous Q4H   mouth rinse  15 mL Mouth Rinse Q2H   pantoprazole (PROTONIX) IV  40 mg Intravenous Q24H   polyethylene glycol  17 g Per Tube Daily   potassium chloride  20 mEq Per Tube Once   sodium chloride flush  10-40 mL Intracatheter Q12H    Assessment: 82 yo male s/p cardiac arrest with VDRF on heparin for r/o ACS.  He is not on an oral anticoagulant at home.   -heparin level= 0.61 on 1000 units/hr -hg= 12.7 -plt= 292 -no s/sx of bleeding, per RN report  Goal of Therapy:  Heparin level 0.3-0.7 units/ml Monitor platelets by anticoagulation protocol: Yes   Plan:  -Continue heparin infusion at 1000 units/hr -Monitor daily CBC, heparin level, and for s/sx of bleeding -F/u cardiology recommendations   Wilburn Cornelia, PharmD, BCPS Clinical Pharmacist 07/09/2023 10:01 PM   Please refer to [facility] for pharmacy phone number

## 2023-07-09 NOTE — Progress Notes (Signed)
Peripherally Inserted Central Catheter Placement  The IV Nurse has discussed with the patient and/or persons authorized to consent for the patient, the purpose of this procedure and the potential benefits and risks involved with this procedure.  The benefits include less needle sticks, lab draws from the catheter, and the patient may be discharged home with the catheter. Risks include, but not limited to, infection, bleeding, blood clot (thrombus formation), and puncture of an artery; nerve damage and irregular heartbeat and possibility to perform a PICC exchange if needed/ordered by physician.  Alternatives to this procedure were also discussed.  Bard Power PICC patient education guide, fact sheet on infection prevention and patient information card has been provided to patient /or left at bedside.    PICC Placement Documentation  PICC Triple Lumen 07/09/23 Right Basilic 39 cm 0 cm (Active)  Indication for Insertion or Continuance of Line Vasoactive infusions 07/09/23 1444  Exposed Catheter (cm) 0 cm 07/09/23 1444  Site Assessment Clean, Dry, Intact 07/09/23 1444  Lumen #1 Status Flushed;Blood return noted;Saline locked 07/09/23 1444  Lumen #2 Status Flushed;Blood return noted;Saline locked 07/09/23 1444  Lumen #3 Status Flushed;Blood return noted;Saline locked 07/09/23 1444  Dressing Type Transparent 07/09/23 1444  Dressing Status Antimicrobial disc in place 07/09/23 1444  Line Care Connections checked and tightened 07/09/23 1444  Line Adjustment (NICU/IV Team Only) No 07/09/23 1444  Dressing Intervention New dressing 07/09/23 1444  Dressing Change Due 07/16/23 07/09/23 1444       Carlen Rebuck Ramos 07/09/2023, 2:46 PM

## 2023-07-09 NOTE — Progress Notes (Signed)
Pt transported from ED Trauma A to 2H12 without complications.

## 2023-07-09 NOTE — Progress Notes (Addendum)
eLink Physician-Brief Progress Note Patient Name: Andre Jimenez DOB: November 20, 1940 MRN: [id number]   Date of Service  07/09/2023  HPI/Events of Note  83 year old with ischemic cardiomyopathy with LBBB for home cardiac resynchronization was being contemplated by CHF service, wife attempted CPR for 12 minutes a total about 20 minutes, intubated in the field.  Unresponsive on arrival to the ED requiring mechanical ventilation.  Appears tachypneic and bradycardic on the ventilator with 100% SpO2 on 40% FiO2.  Results consistent with adequate ventilation and oxygenation.  Metabolic acidosis with anion gap present elevated lactic acid.  Hyperglycemia present.  Leukocytosis.   No intracranial abnormalities.  Degenerative changes of the cervical spine.  Endotracheal tube 7 cm above the carina.  Severe canal stenosis.  eICU Interventions  Daily spontaneous awakening and breathing trials.  Continue supportive care.  Ongoing goals of care conversation  DVT prophylaxis with heparin GI prophylaxis with pantoprazole 2020 requiring immediate   0313 -renew continuous fentanyl infusion.    Intervention Category Evaluation Type: New Patient Evaluation  Vikas Wegmann 07/09/2023, 1:05 AM

## 2023-07-09 NOTE — Progress Notes (Signed)
PHARMACY - ANTICOAGULATION CONSULT NOTE  Pharmacy Consult for Heparin Indication: chest pain/ACS  Allergies  Allergen Reactions   Ivp Dye [Iodinated Contrast Media] Anaphylaxis    Had anaphylactic reaction after TAVR- potentially related to contrast dye   Protamine Anaphylaxis    Had potential anaphylactic shock after protamine administration after TAVR    Patient Measurements: Height: 5\' 10"  (177.8 cm) Weight: 82.8 kg (182 lb 8.7 oz) IBW/kg (Calculated) : 73  Vital Signs: Temp: 98.1 F (36.7 C) (10/08 0330) BP: 114/69 (10/08 0330) Pulse Rate: 53 (10/08 0330)  Labs: Recent Labs    07/07/2023 2102 07/12/2023 2108 07/11/2023 2221 07/09/23 0039 07/09/23 0041 07/09/23 0257  HGB 13.6 14.3  14.3 14.3  --  12.3* 12.7*  HCT 43.9 42.0  42.0 42.0  --  38.3* 39.2  PLT 291  --   --   --  279 292  CREATININE 1.39* 1.30*  --   --  1.43* 1.40*  TROPONINIHS 562*  --   --  1,271*  --   --     Estimated Creatinine Clearance: 42.7 mL/min (A) (by C-G formula based on SCr of 1.4 mg/dL (H)).   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 male admitted s/p cardiac arrest, elevated troponin, for heparin Goal of Therapy:  Heparin level 0.3-0.7 units/ml Monitor platelets by anticoagulation protocol: Yes   Plan:  Heparin 4000 units IV bolus, then start heparin 1000 units/hr Check heparin level in 8 hours.   Hokulani Rogel, Gary Fleet 07/09/2023,4:30 AM

## 2023-07-09 NOTE — Consult Note (Addendum)
without pericholecystic inflammatory change. Liver unremarkable. No intra or extrahepatic biliary ductal dilation. Pancreas: Unremarkable Spleen: Unremarkable Adrenals/Urinary Tract: Adrenal glands are unremarkable. Kidneys are normal, without renal calculi, focal lesion, or hydronephrosis. Bladder is unremarkable. Stomach/Bowel: Stomach is within normal limits. Appendix absent. No evidence of bowel wall thickening, distention, or inflammatory changes. Vascular/Lymphatic: Extensive aortoiliac atherosclerotic calcification. No aortic aneurysm. No pathologic adenopathy within the abdomen and pelvis. Reproductive: Marked prostatic hypertrophy. Other: No abdominal wall hernia. Musculoskeletal: L4-5 lumbar fusion with instrumentation and posterior compression of L4 has been performed. No acute bone abnormality. No lytic or blastic bone lesion. IMPRESSION: 1. No acute intracranial  injury. No calvarial fracture. 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 3. No acute fracture or listhesis of the cervical spine. 4. 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 5. 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 Superimposed bilateral neuroforaminal narrowing. 6. No acute intrathoracic or intra-abdominal injury. 7. Extensive multi-vessel coronary artery calcification. 8. Cholelithiasis. 9. Marked prostatic hypertrophy. Aortic Atherosclerosis (ICD10-I70.0). Electronically Signed   By: Helyn Numbers M.D.   On: 07/22/2023 22:21   CT Cervical Spine Wo Contrast  Result Date: 07/09/2023 CLINICAL DATA:  unwitnessed arrest/fall; unwitnessed arrest; unwitnessed fall, cardiac arrest EXAM: CT HEAD WITHOUT CONTRAST CT CERVICAL SPINE WITHOUT CONTRAST CT CHEST, ABDOMEN AND PELVIS WITHOUT CONTRAST TECHNIQUE: Contiguous axial images were obtained from the base of the skull through the vertex without intravenous contrast. Multidetector CT imaging of the cervical spine was performed without intravenous contrast. Multiplanar CT image reconstructions were also generated. Multidetector CT imaging of the chest, abdomen and pelvis was performed following the standard protocol without IV contrast. RADIATION DOSE REDUCTION: This exam was performed according to the departmental dose-optimization program which includes automated exposure control, adjustment of the mA and/or kV according to patient size and/or use of iterative reconstruction technique. COMPARISON:  10/16/2022 FINDINGS: CT HEAD FINDINGS Brain: Normal anatomic configuration of the brain. 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 Tiny remote lacunar infarcts are noted within the left centrum semiovale. Small remote infarct within the left  cerebellar hemisphere. No acute intracranial hemorrhage or infarct. No abnormal mass effect or midline shift. No abnormal intra or extra-axial mass lesion. Ventricular size is normal. Cerebellum is otherwise unremarkable. Vascular: No hyperdense vessel or unexpected calcification. Skull: Normal. Negative for fracture or focal lesion. Sinuses/Orbits: The orbits are unremarkable. There is extensive mucosal thickening of the left maxillary sinus with layering fluid in keeping with possible changes of acute sinusitis. Mucosal thickening and layering fluid with central calcification is noted within the left sphenoid sinus in keeping with changes of chronic sinusitis in this location. Remaining paranasal sinuses are clear. Other: Mastoid air cells and middle ear cavities are clear. CT CERVICAL FINDINGS Alignment: Normal. Skull base and vertebrae: Craniocervical alignment is normal. The atlantodental interval is not widened. Anterior cervical discectomy and fusion with instrumentation of C5-C7 has been performed with solid incorporation of interbody bone graft and ankylosis of the right facet joints at these levels. There are bulky bridging anterior disc osteophytes noted at C2-C4. Ankylosis of the left facet joints of C2-3. No acute fracture of the cervical spine. Soft tissues and spinal canal: Similar prior examination, there is thickening of the anterior longitudinal ligament superiorly, particularly at C1-2 resulting in high-grade central canal stenosis and minimal remodeling of the thecal sac. 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 These findings appears stable since prior examination. No canal hematoma. No prevertebral soft tissue swelling or paraspinal fluid collection identified. Disc levels: Disc space narrowing at C7-T1 in keeping with changes of moderate  degenerative disc disease. 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 Osseous spurring results in multilevel neuroforaminal narrowing, most severe on the left at C3-4 and on the right at C4-5. Other: None CT CHEST FINDINGS Cardiovascular: Extensive multi-vessel coronary artery calcification. Left anterior descending coronary artery stenting has been performed. 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 Mild cardiomegaly. No pericardial effusion. Central pulmonary arteries are of normal caliber. Moderate atherosclerotic calcification within the thoracic aorta. No aortic aneurysm. Mediastinum/Nodes: No enlarged mediastinal, hilar, or axillary lymph nodes. Thyroid gland, trachea, and esophagus demonstrate no significant findings. Lungs/Pleura: Lungs are clear. No pleural effusion or pneumothorax. Musculoskeletal: No acute bone abnormality. Remote right rib fractures noted. Osseous structures are age-appropriate. No lytic or blastic bone lesion. CT ABDOMEN AND PELVIS FINDINGS Hepatobiliary: Cholelithiasis without pericholecystic inflammatory change. Liver unremarkable. No intra or extrahepatic biliary ductal dilation. Pancreas: Unremarkable Spleen: Unremarkable Adrenals/Urinary Tract: Adrenal glands are unremarkable. Kidneys are normal, without renal calculi, focal lesion, or hydronephrosis. Bladder is unremarkable. Stomach/Bowel: Stomach is within normal limits. Appendix absent. No evidence of bowel wall thickening, distention, or inflammatory changes. Vascular/Lymphatic: Extensive aortoiliac atherosclerotic calcification. No aortic aneurysm. No pathologic adenopathy within the abdomen and pelvis. Reproductive: Marked prostatic hypertrophy. Other: No abdominal wall hernia. Musculoskeletal: L4-5 lumbar fusion with instrumentation and posterior compression of L4 has been performed. No acute bone abnormality. No lytic or  blastic bone lesion. IMPRESSION: 1. No acute intracranial injury. No calvarial fracture. 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 3. No acute fracture or listhesis of the cervical spine. 4. 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 5. 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 Superimposed bilateral neuroforaminal narrowing. 6. No acute intrathoracic or intra-abdominal injury. 7. Extensive multi-vessel coronary artery calcification. 8. Cholelithiasis. 9. Marked prostatic hypertrophy. Aortic Atherosclerosis (ICD10-I70.0). Electronically Signed   By: Helyn Numbers M.D.   On: 07/21/2023 22:21   CT CHEST ABDOMEN PELVIS WO CONTRAST  Result Date: 07/09/2023 CLINICAL DATA:  unwitnessed arrest/fall; unwitnessed arrest; unwitnessed fall, cardiac arrest EXAM: CT HEAD WITHOUT CONTRAST CT CERVICAL SPINE WITHOUT CONTRAST CT CHEST, ABDOMEN AND PELVIS WITHOUT CONTRAST TECHNIQUE: Contiguous axial images were obtained from the base of the skull through the vertex without intravenous contrast. Multidetector CT imaging of the cervical spine was performed without intravenous contrast. Multiplanar CT image reconstructions were also generated. Multidetector CT imaging of the chest, abdomen and pelvis was performed following the standard protocol without IV contrast. RADIATION DOSE REDUCTION: This exam was performed according to the departmental dose-optimization program which includes automated exposure control, adjustment of the mA and/or kV according to patient size and/or use of iterative reconstruction technique. COMPARISON:  10/16/2022 FINDINGS: CT HEAD FINDINGS Brain: Normal anatomic configuration of the brain. 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 Tiny remote lacunar infarcts are noted within the  left centrum semiovale. Small remote infarct within the left cerebellar hemisphere. No acute intracranial hemorrhage or infarct. No abnormal mass effect or midline  Marked prostatic hypertrophy. Aortic Atherosclerosis (ICD10-I70.0). Electronically Signed   By: Helyn Numbers M.D.   On: 07/03/2023 22:21   DG Chest Portable 1 View  Result Date: 07/14/2023 CLINICAL DATA:  Respiratory failure EXAM: PORTABLE CHEST 1 VIEW COMPARISON:  02/08/2022 FINDINGS: Endotracheal tube seen 7.2 cm above the carina with its tip at the level of the clavicular heads. Biapical opacity related to hypertrophic changes of the first costochondral junctions. Lungs are clear. No pneumothorax or pleural effusion. Cardiac size is within normal limits. 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 Nasogastric tube extends into the upper abdomen beyond the margin of the examination. Pulmonary vascularity is normal. No acute bone abnormality. IMPRESSION: 1. Endotracheal tube 7.2 cm above the carina. 2. No focal pulmonary infiltrate. Electronically Signed   By: Helyn Numbers M.D.   On: 07/16/2023 22:00     Medications:     Current Medications:  aspirin  325 mg Per Tube Daily   Chlorhexidine Gluconate Cloth  6 each Topical Daily   docusate  100 mg Per Tube BID   insulin aspart  0-15 Units Subcutaneous Q4H   mouth rinse  15 mL Mouth Rinse Q2H   pantoprazole (PROTONIX) IV  40 mg Intravenous Q24H    polyethylene glycol  17 g Per Tube Daily   potassium chloride  20 mEq Per Tube Once    Infusions:  sodium chloride     fentaNYL infusion INTRAVENOUS 125 mcg/hr (07/09/23 1000)   heparin 1,000 Units/hr (07/09/23 1000)   levETIRAcetam     midazolam 20 mg/hr (07/09/23 1000)   norepinephrine (LEVOPHED) Adult infusion 7 mcg/min (07/09/23 1000)   propofol (DIPRIVAN) infusion 40 mcg/kg/min (07/09/23 1021)   valproate sodium     valproate sodium        Patient Profile   82 y.o. male with history of CAD s/p LAD PCI in 2021 (nonobstructive dz on repeat LHC in 2023), aortic stenosis s/p TAVR, chronic systolic HF last known EF 35-40% and chronic LBBB admitted for out of hospital VT arrest.   Assessment/Plan   1. Post VT Arrest  - downtime ~20 min prior to ROSC, likely precipitated by electrolyte abnormalities, initial Mg 1.3, K 3.2. Now repleated, repeat Mg 2.0, K 3.8 - brief NSVT overnight (4 beats) but no sustained arrhthymias - not on amio gtt given SB, upper 40s-low 50s   - keep K > 4.0 and Mg > 2.0  - concern regarding anoxic brain injury. If any meaningful recovery, can consider relook LHC  - Recent plans were for possible CRT-P. With recent VT arrest would ideally opt for CRT-D but age may prohibit.   2. Acute on Chronic Systolic Heart Failure, Suspect Low-Output State - Echo in 5/23 with EF 30-35%, normally-functioning bioprosthetic AoV and normal RV.  Most recent prior echo from 5/22 showed EF 50-55%.  Fall in EF was associated with CHF exacerbation and atypical chest pain.  He also has LBBB that is new since 3/22.  RHC/LHC was done in 6/23; filling pressures were optimized and CO was normal, moderate disease in OM2 did not explain fall in EF.  Repeat Echo 12/23 EF 30% with global hypokinesis, normal RV, normal bioprosthetic aortic valve with mean gradient 10 and no AI (s/p TAVR), IVC normal.  Echo 7/24 showed EF 35-40% with septal-lateral dyssynchrony, normal RV, IVC normal, s/p TAVR  with mean gradient 13 mmHg and no paravalvular leakage. Nonischemic cardiomyopathy, ?LBBB cardiomyopathy.  - Now post VT arrest. BPs soft, 90s systolic + SB and  Marked prostatic hypertrophy. Aortic Atherosclerosis (ICD10-I70.0). Electronically Signed   By: Helyn Numbers M.D.   On: 07/03/2023 22:21   DG Chest Portable 1 View  Result Date: 07/14/2023 CLINICAL DATA:  Respiratory failure EXAM: PORTABLE CHEST 1 VIEW COMPARISON:  02/08/2022 FINDINGS: Endotracheal tube seen 7.2 cm above the carina with its tip at the level of the clavicular heads. Biapical opacity related to hypertrophic changes of the first costochondral junctions. Lungs are clear. No pneumothorax or pleural effusion. Cardiac size is within normal limits. 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 Nasogastric tube extends into the upper abdomen beyond the margin of the examination. Pulmonary vascularity is normal. No acute bone abnormality. IMPRESSION: 1. Endotracheal tube 7.2 cm above the carina. 2. No focal pulmonary infiltrate. Electronically Signed   By: Helyn Numbers M.D.   On: 07/16/2023 22:00     Medications:     Current Medications:  aspirin  325 mg Per Tube Daily   Chlorhexidine Gluconate Cloth  6 each Topical Daily   docusate  100 mg Per Tube BID   insulin aspart  0-15 Units Subcutaneous Q4H   mouth rinse  15 mL Mouth Rinse Q2H   pantoprazole (PROTONIX) IV  40 mg Intravenous Q24H    polyethylene glycol  17 g Per Tube Daily   potassium chloride  20 mEq Per Tube Once    Infusions:  sodium chloride     fentaNYL infusion INTRAVENOUS 125 mcg/hr (07/09/23 1000)   heparin 1,000 Units/hr (07/09/23 1000)   levETIRAcetam     midazolam 20 mg/hr (07/09/23 1000)   norepinephrine (LEVOPHED) Adult infusion 7 mcg/min (07/09/23 1000)   propofol (DIPRIVAN) infusion 40 mcg/kg/min (07/09/23 1021)   valproate sodium     valproate sodium        Patient Profile   82 y.o. male with history of CAD s/p LAD PCI in 2021 (nonobstructive dz on repeat LHC in 2023), aortic stenosis s/p TAVR, chronic systolic HF last known EF 35-40% and chronic LBBB admitted for out of hospital VT arrest.   Assessment/Plan   1. Post VT Arrest  - downtime ~20 min prior to ROSC, likely precipitated by electrolyte abnormalities, initial Mg 1.3, K 3.2. Now repleated, repeat Mg 2.0, K 3.8 - brief NSVT overnight (4 beats) but no sustained arrhthymias - not on amio gtt given SB, upper 40s-low 50s   - keep K > 4.0 and Mg > 2.0  - concern regarding anoxic brain injury. If any meaningful recovery, can consider relook LHC  - Recent plans were for possible CRT-P. With recent VT arrest would ideally opt for CRT-D but age may prohibit.   2. Acute on Chronic Systolic Heart Failure, Suspect Low-Output State - Echo in 5/23 with EF 30-35%, normally-functioning bioprosthetic AoV and normal RV.  Most recent prior echo from 5/22 showed EF 50-55%.  Fall in EF was associated with CHF exacerbation and atypical chest pain.  He also has LBBB that is new since 3/22.  RHC/LHC was done in 6/23; filling pressures were optimized and CO was normal, moderate disease in OM2 did not explain fall in EF.  Repeat Echo 12/23 EF 30% with global hypokinesis, normal RV, normal bioprosthetic aortic valve with mean gradient 10 and no AI (s/p TAVR), IVC normal.  Echo 7/24 showed EF 35-40% with septal-lateral dyssynchrony, normal RV, IVC normal, s/p TAVR  with mean gradient 13 mmHg and no paravalvular leakage. Nonischemic cardiomyopathy, ?LBBB cardiomyopathy.  - Now post VT arrest. BPs soft, 90s systolic + SB and  without pericholecystic inflammatory change. Liver unremarkable. No intra or extrahepatic biliary ductal dilation. Pancreas: Unremarkable Spleen: Unremarkable Adrenals/Urinary Tract: Adrenal glands are unremarkable. Kidneys are normal, without renal calculi, focal lesion, or hydronephrosis. Bladder is unremarkable. Stomach/Bowel: Stomach is within normal limits. Appendix absent. No evidence of bowel wall thickening, distention, or inflammatory changes. Vascular/Lymphatic: Extensive aortoiliac atherosclerotic calcification. No aortic aneurysm. No pathologic adenopathy within the abdomen and pelvis. Reproductive: Marked prostatic hypertrophy. Other: No abdominal wall hernia. Musculoskeletal: L4-5 lumbar fusion with instrumentation and posterior compression of L4 has been performed. No acute bone abnormality. No lytic or blastic bone lesion. IMPRESSION: 1. No acute intracranial  injury. No calvarial fracture. 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 3. No acute fracture or listhesis of the cervical spine. 4. 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 5. 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 Superimposed bilateral neuroforaminal narrowing. 6. No acute intrathoracic or intra-abdominal injury. 7. Extensive multi-vessel coronary artery calcification. 8. Cholelithiasis. 9. Marked prostatic hypertrophy. Aortic Atherosclerosis (ICD10-I70.0). Electronically Signed   By: Helyn Numbers M.D.   On: 07/22/2023 22:21   CT Cervical Spine Wo Contrast  Result Date: 07/09/2023 CLINICAL DATA:  unwitnessed arrest/fall; unwitnessed arrest; unwitnessed fall, cardiac arrest EXAM: CT HEAD WITHOUT CONTRAST CT CERVICAL SPINE WITHOUT CONTRAST CT CHEST, ABDOMEN AND PELVIS WITHOUT CONTRAST TECHNIQUE: Contiguous axial images were obtained from the base of the skull through the vertex without intravenous contrast. Multidetector CT imaging of the cervical spine was performed without intravenous contrast. Multiplanar CT image reconstructions were also generated. Multidetector CT imaging of the chest, abdomen and pelvis was performed following the standard protocol without IV contrast. RADIATION DOSE REDUCTION: This exam was performed according to the departmental dose-optimization program which includes automated exposure control, adjustment of the mA and/or kV according to patient size and/or use of iterative reconstruction technique. COMPARISON:  10/16/2022 FINDINGS: CT HEAD FINDINGS Brain: Normal anatomic configuration of the brain. 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 Tiny remote lacunar infarcts are noted within the left centrum semiovale. Small remote infarct within the left  cerebellar hemisphere. No acute intracranial hemorrhage or infarct. No abnormal mass effect or midline shift. No abnormal intra or extra-axial mass lesion. Ventricular size is normal. Cerebellum is otherwise unremarkable. Vascular: No hyperdense vessel or unexpected calcification. Skull: Normal. Negative for fracture or focal lesion. Sinuses/Orbits: The orbits are unremarkable. There is extensive mucosal thickening of the left maxillary sinus with layering fluid in keeping with possible changes of acute sinusitis. Mucosal thickening and layering fluid with central calcification is noted within the left sphenoid sinus in keeping with changes of chronic sinusitis in this location. Remaining paranasal sinuses are clear. Other: Mastoid air cells and middle ear cavities are clear. CT CERVICAL FINDINGS Alignment: Normal. Skull base and vertebrae: Craniocervical alignment is normal. The atlantodental interval is not widened. Anterior cervical discectomy and fusion with instrumentation of C5-C7 has been performed with solid incorporation of interbody bone graft and ankylosis of the right facet joints at these levels. There are bulky bridging anterior disc osteophytes noted at C2-C4. Ankylosis of the left facet joints of C2-3. No acute fracture of the cervical spine. Soft tissues and spinal canal: Similar prior examination, there is thickening of the anterior longitudinal ligament superiorly, particularly at C1-2 resulting in high-grade central canal stenosis and minimal remodeling of the thecal sac. This, in combination with  without pericholecystic inflammatory change. Liver unremarkable. No intra or extrahepatic biliary ductal dilation. Pancreas: Unremarkable Spleen: Unremarkable Adrenals/Urinary Tract: Adrenal glands are unremarkable. Kidneys are normal, without renal calculi, focal lesion, or hydronephrosis. Bladder is unremarkable. Stomach/Bowel: Stomach is within normal limits. Appendix absent. No evidence of bowel wall thickening, distention, or inflammatory changes. Vascular/Lymphatic: Extensive aortoiliac atherosclerotic calcification. No aortic aneurysm. No pathologic adenopathy within the abdomen and pelvis. Reproductive: Marked prostatic hypertrophy. Other: No abdominal wall hernia. Musculoskeletal: L4-5 lumbar fusion with instrumentation and posterior compression of L4 has been performed. No acute bone abnormality. No lytic or blastic bone lesion. IMPRESSION: 1. No acute intracranial  injury. No calvarial fracture. 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 3. No acute fracture or listhesis of the cervical spine. 4. 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 5. 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 Superimposed bilateral neuroforaminal narrowing. 6. No acute intrathoracic or intra-abdominal injury. 7. Extensive multi-vessel coronary artery calcification. 8. Cholelithiasis. 9. Marked prostatic hypertrophy. Aortic Atherosclerosis (ICD10-I70.0). Electronically Signed   By: Helyn Numbers M.D.   On: 07/22/2023 22:21   CT Cervical Spine Wo Contrast  Result Date: 07/09/2023 CLINICAL DATA:  unwitnessed arrest/fall; unwitnessed arrest; unwitnessed fall, cardiac arrest EXAM: CT HEAD WITHOUT CONTRAST CT CERVICAL SPINE WITHOUT CONTRAST CT CHEST, ABDOMEN AND PELVIS WITHOUT CONTRAST TECHNIQUE: Contiguous axial images were obtained from the base of the skull through the vertex without intravenous contrast. Multidetector CT imaging of the cervical spine was performed without intravenous contrast. Multiplanar CT image reconstructions were also generated. Multidetector CT imaging of the chest, abdomen and pelvis was performed following the standard protocol without IV contrast. RADIATION DOSE REDUCTION: This exam was performed according to the departmental dose-optimization program which includes automated exposure control, adjustment of the mA and/or kV according to patient size and/or use of iterative reconstruction technique. COMPARISON:  10/16/2022 FINDINGS: CT HEAD FINDINGS Brain: Normal anatomic configuration of the brain. 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 Tiny remote lacunar infarcts are noted within the left centrum semiovale. Small remote infarct within the left  cerebellar hemisphere. No acute intracranial hemorrhage or infarct. No abnormal mass effect or midline shift. No abnormal intra or extra-axial mass lesion. Ventricular size is normal. Cerebellum is otherwise unremarkable. Vascular: No hyperdense vessel or unexpected calcification. Skull: Normal. Negative for fracture or focal lesion. Sinuses/Orbits: The orbits are unremarkable. There is extensive mucosal thickening of the left maxillary sinus with layering fluid in keeping with possible changes of acute sinusitis. Mucosal thickening and layering fluid with central calcification is noted within the left sphenoid sinus in keeping with changes of chronic sinusitis in this location. Remaining paranasal sinuses are clear. Other: Mastoid air cells and middle ear cavities are clear. CT CERVICAL FINDINGS Alignment: Normal. Skull base and vertebrae: Craniocervical alignment is normal. The atlantodental interval is not widened. Anterior cervical discectomy and fusion with instrumentation of C5-C7 has been performed with solid incorporation of interbody bone graft and ankylosis of the right facet joints at these levels. There are bulky bridging anterior disc osteophytes noted at C2-C4. Ankylosis of the left facet joints of C2-3. No acute fracture of the cervical spine. Soft tissues and spinal canal: Similar prior examination, there is thickening of the anterior longitudinal ligament superiorly, particularly at C1-2 resulting in high-grade central canal stenosis and minimal remodeling of the thecal sac. 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 These findings appears stable since prior examination. No canal hematoma. No prevertebral soft tissue swelling or paraspinal fluid collection identified. Disc levels: Disc space narrowing at C7-T1 in keeping with changes of moderate  degenerative disc disease. 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 Osseous spurring results in multilevel neuroforaminal narrowing, most severe on the left at C3-4 and on the right at C4-5. Other: None CT CHEST FINDINGS Cardiovascular: Extensive multi-vessel coronary artery calcification. Left anterior descending coronary artery stenting has been performed. 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 Mild cardiomegaly. No pericardial effusion. Central pulmonary arteries are of normal caliber. Moderate atherosclerotic calcification within the thoracic aorta. No aortic aneurysm. Mediastinum/Nodes: No enlarged mediastinal, hilar, or axillary lymph nodes. Thyroid gland, trachea, and esophagus demonstrate no significant findings. Lungs/Pleura: Lungs are clear. No pleural effusion or pneumothorax. Musculoskeletal: No acute bone abnormality. Remote right rib fractures noted. Osseous structures are age-appropriate. No lytic or blastic bone lesion. CT ABDOMEN AND PELVIS FINDINGS Hepatobiliary: Cholelithiasis without pericholecystic inflammatory change. Liver unremarkable. No intra or extrahepatic biliary ductal dilation. Pancreas: Unremarkable Spleen: Unremarkable Adrenals/Urinary Tract: Adrenal glands are unremarkable. Kidneys are normal, without renal calculi, focal lesion, or hydronephrosis. Bladder is unremarkable. Stomach/Bowel: Stomach is within normal limits. Appendix absent. No evidence of bowel wall thickening, distention, or inflammatory changes. Vascular/Lymphatic: Extensive aortoiliac atherosclerotic calcification. No aortic aneurysm. No pathologic adenopathy within the abdomen and pelvis. Reproductive: Marked prostatic hypertrophy. Other: No abdominal wall hernia. Musculoskeletal: L4-5 lumbar fusion with instrumentation and posterior compression of L4 has been performed. No acute bone abnormality. No lytic or  blastic bone lesion. IMPRESSION: 1. No acute intracranial injury. No calvarial fracture. 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 3. No acute fracture or listhesis of the cervical spine. 4. 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 5. 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 Superimposed bilateral neuroforaminal narrowing. 6. No acute intrathoracic or intra-abdominal injury. 7. Extensive multi-vessel coronary artery calcification. 8. Cholelithiasis. 9. Marked prostatic hypertrophy. Aortic Atherosclerosis (ICD10-I70.0). Electronically Signed   By: Helyn Numbers M.D.   On: 07/21/2023 22:21   CT CHEST ABDOMEN PELVIS WO CONTRAST  Result Date: 07/09/2023 CLINICAL DATA:  unwitnessed arrest/fall; unwitnessed arrest; unwitnessed fall, cardiac arrest EXAM: CT HEAD WITHOUT CONTRAST CT CERVICAL SPINE WITHOUT CONTRAST CT CHEST, ABDOMEN AND PELVIS WITHOUT CONTRAST TECHNIQUE: Contiguous axial images were obtained from the base of the skull through the vertex without intravenous contrast. Multidetector CT imaging of the cervical spine was performed without intravenous contrast. Multiplanar CT image reconstructions were also generated. Multidetector CT imaging of the chest, abdomen and pelvis was performed following the standard protocol without IV contrast. RADIATION DOSE REDUCTION: This exam was performed according to the departmental dose-optimization program which includes automated exposure control, adjustment of the mA and/or kV according to patient size and/or use of iterative reconstruction technique. COMPARISON:  10/16/2022 FINDINGS: CT HEAD FINDINGS Brain: Normal anatomic configuration of the brain. 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 Tiny remote lacunar infarcts are noted within the  left centrum semiovale. Small remote infarct within the left cerebellar hemisphere. No acute intracranial hemorrhage or infarct. 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 These findings appears stable since prior examination. No canal hematoma. No prevertebral soft tissue swelling or paraspinal fluid collection identified. Disc levels: Disc space narrowing at C7-T1 in keeping with changes of moderate  degenerative disc disease. 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 Osseous spurring results in multilevel neuroforaminal narrowing, most severe on the left at C3-4 and on the right at C4-5. Other: None CT CHEST FINDINGS Cardiovascular: Extensive multi-vessel coronary artery calcification. Left anterior descending coronary artery stenting has been performed. 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 Mild cardiomegaly. No pericardial effusion. Central pulmonary arteries are of normal caliber. Moderate atherosclerotic calcification within the thoracic aorta. No aortic aneurysm. Mediastinum/Nodes: No enlarged mediastinal, hilar, or axillary lymph nodes. Thyroid gland, trachea, and esophagus demonstrate no significant findings. Lungs/Pleura: Lungs are clear. No pleural effusion or pneumothorax. Musculoskeletal: No acute bone abnormality. Remote right rib fractures noted. Osseous structures are age-appropriate. No lytic or blastic bone lesion. CT ABDOMEN AND PELVIS FINDINGS Hepatobiliary: Cholelithiasis without pericholecystic inflammatory change. Liver unremarkable. No intra or extrahepatic biliary ductal dilation. Pancreas: Unremarkable Spleen: Unremarkable Adrenals/Urinary Tract: Adrenal glands are unremarkable. Kidneys are normal, without renal calculi, focal lesion, or hydronephrosis. Bladder is unremarkable. Stomach/Bowel: Stomach is within normal limits. Appendix absent. No evidence of bowel wall thickening, distention, or inflammatory changes. Vascular/Lymphatic: Extensive aortoiliac atherosclerotic calcification. No aortic aneurysm. No pathologic adenopathy within the abdomen and pelvis. Reproductive: Marked prostatic hypertrophy. Other: No abdominal wall hernia. Musculoskeletal: L4-5 lumbar fusion with instrumentation and posterior compression of L4 has been performed. No acute bone abnormality. No lytic or  blastic bone lesion. IMPRESSION: 1. No acute intracranial injury. No calvarial fracture. 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 3. No acute fracture or listhesis of the cervical spine. 4. 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 5. 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 Superimposed bilateral neuroforaminal narrowing. 6. No acute intrathoracic or intra-abdominal injury. 7. Extensive multi-vessel coronary artery calcification. 8. Cholelithiasis. 9. Marked prostatic hypertrophy. Aortic Atherosclerosis (ICD10-I70.0). Electronically Signed   By: Helyn Numbers M.D.   On: 07/21/2023 22:21   CT CHEST ABDOMEN PELVIS WO CONTRAST  Result Date: 07/09/2023 CLINICAL DATA:  unwitnessed arrest/fall; unwitnessed arrest; unwitnessed fall, cardiac arrest EXAM: CT HEAD WITHOUT CONTRAST CT CERVICAL SPINE WITHOUT CONTRAST CT CHEST, ABDOMEN AND PELVIS WITHOUT CONTRAST TECHNIQUE: Contiguous axial images were obtained from the base of the skull through the vertex without intravenous contrast. Multidetector CT imaging of the cervical spine was performed without intravenous contrast. Multiplanar CT image reconstructions were also generated. Multidetector CT imaging of the chest, abdomen and pelvis was performed following the standard protocol without IV contrast. RADIATION DOSE REDUCTION: This exam was performed according to the departmental dose-optimization program which includes automated exposure control, adjustment of the mA and/or kV according to patient size and/or use of iterative reconstruction technique. COMPARISON:  10/16/2022 FINDINGS: CT HEAD FINDINGS Brain: Normal anatomic configuration of the brain. 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 Tiny remote lacunar infarcts are noted within the  left centrum semiovale. Small remote infarct within the left cerebellar hemisphere. No acute intracranial hemorrhage or infarct. 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 These findings appears stable since prior examination. No canal hematoma. No prevertebral soft tissue swelling or paraspinal fluid collection identified. Disc levels: Disc space narrowing at C7-T1 in keeping with changes of moderate  degenerative disc disease. 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 Osseous spurring results in multilevel neuroforaminal narrowing, most severe on the left at C3-4 and on the right at C4-5. Other: None CT CHEST FINDINGS Cardiovascular: Extensive multi-vessel coronary artery calcification. Left anterior descending coronary artery stenting has been performed. 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 Mild cardiomegaly. No pericardial effusion. Central pulmonary arteries are of normal caliber. Moderate atherosclerotic calcification within the thoracic aorta. No aortic aneurysm. Mediastinum/Nodes: No enlarged mediastinal, hilar, or axillary lymph nodes. Thyroid gland, trachea, and esophagus demonstrate no significant findings. Lungs/Pleura: Lungs are clear. No pleural effusion or pneumothorax. Musculoskeletal: No acute bone abnormality. Remote right rib fractures noted. Osseous structures are age-appropriate. No lytic or blastic bone lesion. CT ABDOMEN AND PELVIS FINDINGS Hepatobiliary: Cholelithiasis without pericholecystic inflammatory change. Liver unremarkable. No intra or extrahepatic biliary ductal dilation. Pancreas: Unremarkable Spleen: Unremarkable Adrenals/Urinary Tract: Adrenal glands are unremarkable. Kidneys are normal, without renal calculi, focal lesion, or hydronephrosis. Bladder is unremarkable. Stomach/Bowel: Stomach is within normal limits. Appendix absent. No evidence of bowel wall thickening, distention, or inflammatory changes. Vascular/Lymphatic: Extensive aortoiliac atherosclerotic calcification. No aortic aneurysm. No pathologic adenopathy within the abdomen and pelvis. Reproductive: Marked prostatic hypertrophy. Other: No abdominal wall hernia. Musculoskeletal: L4-5 lumbar fusion with instrumentation and posterior compression of L4 has been performed. No acute bone abnormality. No lytic or  blastic bone lesion. IMPRESSION: 1. No acute intracranial injury. No calvarial fracture. 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 3. No acute fracture or listhesis of the cervical spine. 4. 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 5. 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 Superimposed bilateral neuroforaminal narrowing. 6. No acute intrathoracic or intra-abdominal injury. 7. Extensive multi-vessel coronary artery calcification. 8. Cholelithiasis. 9. Marked prostatic hypertrophy. Aortic Atherosclerosis (ICD10-I70.0). Electronically Signed   By: Helyn Numbers M.D.   On: 07/21/2023 22:21   CT CHEST ABDOMEN PELVIS WO CONTRAST  Result Date: 07/09/2023 CLINICAL DATA:  unwitnessed arrest/fall; unwitnessed arrest; unwitnessed fall, cardiac arrest EXAM: CT HEAD WITHOUT CONTRAST CT CERVICAL SPINE WITHOUT CONTRAST CT CHEST, ABDOMEN AND PELVIS WITHOUT CONTRAST TECHNIQUE: Contiguous axial images were obtained from the base of the skull through the vertex without intravenous contrast. Multidetector CT imaging of the cervical spine was performed without intravenous contrast. Multiplanar CT image reconstructions were also generated. Multidetector CT imaging of the chest, abdomen and pelvis was performed following the standard protocol without IV contrast. RADIATION DOSE REDUCTION: This exam was performed according to the departmental dose-optimization program which includes automated exposure control, adjustment of the mA and/or kV according to patient size and/or use of iterative reconstruction technique. COMPARISON:  10/16/2022 FINDINGS: CT HEAD FINDINGS Brain: Normal anatomic configuration of the brain. 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 Tiny remote lacunar infarcts are noted within the  left centrum semiovale. Small remote infarct within the left cerebellar hemisphere. No acute intracranial hemorrhage or infarct. 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 These findings appears stable since prior examination. No canal hematoma. No prevertebral soft tissue swelling or paraspinal fluid collection identified. Disc levels: Disc space narrowing at C7-T1 in keeping with changes of moderate  degenerative disc disease. 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 Osseous spurring results in multilevel neuroforaminal narrowing, most severe on the left at C3-4 and on the right at C4-5. Other: None CT CHEST FINDINGS Cardiovascular: Extensive multi-vessel coronary artery calcification. Left anterior descending coronary artery stenting has been performed. 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 Mild cardiomegaly. No pericardial effusion. Central pulmonary arteries are of normal caliber. Moderate atherosclerotic calcification within the thoracic aorta. No aortic aneurysm. Mediastinum/Nodes: No enlarged mediastinal, hilar, or axillary lymph nodes. Thyroid gland, trachea, and esophagus demonstrate no significant findings. Lungs/Pleura: Lungs are clear. No pleural effusion or pneumothorax. Musculoskeletal: No acute bone abnormality. Remote right rib fractures noted. Osseous structures are age-appropriate. No lytic or blastic bone lesion. CT ABDOMEN AND PELVIS FINDINGS Hepatobiliary: Cholelithiasis without pericholecystic inflammatory change. Liver unremarkable. No intra or extrahepatic biliary ductal dilation. Pancreas: Unremarkable Spleen: Unremarkable Adrenals/Urinary Tract: Adrenal glands are unremarkable. Kidneys are normal, without renal calculi, focal lesion, or hydronephrosis. Bladder is unremarkable. Stomach/Bowel: Stomach is within normal limits. Appendix absent. No evidence of bowel wall thickening, distention, or inflammatory changes. Vascular/Lymphatic: Extensive aortoiliac atherosclerotic calcification. No aortic aneurysm. No pathologic adenopathy within the abdomen and pelvis. Reproductive: Marked prostatic hypertrophy. Other: No abdominal wall hernia. Musculoskeletal: L4-5 lumbar fusion with instrumentation and posterior compression of L4 has been performed. No acute bone abnormality. No lytic or  blastic bone lesion. IMPRESSION: 1. No acute intracranial injury. No calvarial fracture. 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 3. No acute fracture or listhesis of the cervical spine. 4. 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 5. 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 Superimposed bilateral neuroforaminal narrowing. 6. No acute intrathoracic or intra-abdominal injury. 7. Extensive multi-vessel coronary artery calcification. 8. Cholelithiasis. 9. Marked prostatic hypertrophy. Aortic Atherosclerosis (ICD10-I70.0). Electronically Signed   By: Helyn Numbers M.D.   On: 07/21/2023 22:21   CT CHEST ABDOMEN PELVIS WO CONTRAST  Result Date: 07/09/2023 CLINICAL DATA:  unwitnessed arrest/fall; unwitnessed arrest; unwitnessed fall, cardiac arrest EXAM: CT HEAD WITHOUT CONTRAST CT CERVICAL SPINE WITHOUT CONTRAST CT CHEST, ABDOMEN AND PELVIS WITHOUT CONTRAST TECHNIQUE: Contiguous axial images were obtained from the base of the skull through the vertex without intravenous contrast. Multidetector CT imaging of the cervical spine was performed without intravenous contrast. Multiplanar CT image reconstructions were also generated. Multidetector CT imaging of the chest, abdomen and pelvis was performed following the standard protocol without IV contrast. RADIATION DOSE REDUCTION: This exam was performed according to the departmental dose-optimization program which includes automated exposure control, adjustment of the mA and/or kV according to patient size and/or use of iterative reconstruction technique. COMPARISON:  10/16/2022 FINDINGS: CT HEAD FINDINGS Brain: Normal anatomic configuration of the brain. 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 Tiny remote lacunar infarcts are noted within the  left centrum semiovale. Small remote infarct within the left cerebellar hemisphere. No acute intracranial hemorrhage or infarct. No abnormal mass effect or midline  without pericholecystic inflammatory change. Liver unremarkable. No intra or extrahepatic biliary ductal dilation. Pancreas: Unremarkable Spleen: Unremarkable Adrenals/Urinary Tract: Adrenal glands are unremarkable. Kidneys are normal, without renal calculi, focal lesion, or hydronephrosis. Bladder is unremarkable. Stomach/Bowel: Stomach is within normal limits. Appendix absent. No evidence of bowel wall thickening, distention, or inflammatory changes. Vascular/Lymphatic: Extensive aortoiliac atherosclerotic calcification. No aortic aneurysm. No pathologic adenopathy within the abdomen and pelvis. Reproductive: Marked prostatic hypertrophy. Other: No abdominal wall hernia. Musculoskeletal: L4-5 lumbar fusion with instrumentation and posterior compression of L4 has been performed. No acute bone abnormality. No lytic or blastic bone lesion. IMPRESSION: 1. No acute intracranial  injury. No calvarial fracture. 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 3. No acute fracture or listhesis of the cervical spine. 4. 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 5. 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 Superimposed bilateral neuroforaminal narrowing. 6. No acute intrathoracic or intra-abdominal injury. 7. Extensive multi-vessel coronary artery calcification. 8. Cholelithiasis. 9. Marked prostatic hypertrophy. Aortic Atherosclerosis (ICD10-I70.0). Electronically Signed   By: Helyn Numbers M.D.   On: 07/22/2023 22:21   CT Cervical Spine Wo Contrast  Result Date: 07/09/2023 CLINICAL DATA:  unwitnessed arrest/fall; unwitnessed arrest; unwitnessed fall, cardiac arrest EXAM: CT HEAD WITHOUT CONTRAST CT CERVICAL SPINE WITHOUT CONTRAST CT CHEST, ABDOMEN AND PELVIS WITHOUT CONTRAST TECHNIQUE: Contiguous axial images were obtained from the base of the skull through the vertex without intravenous contrast. Multidetector CT imaging of the cervical spine was performed without intravenous contrast. Multiplanar CT image reconstructions were also generated. Multidetector CT imaging of the chest, abdomen and pelvis was performed following the standard protocol without IV contrast. RADIATION DOSE REDUCTION: This exam was performed according to the departmental dose-optimization program which includes automated exposure control, adjustment of the mA and/or kV according to patient size and/or use of iterative reconstruction technique. COMPARISON:  10/16/2022 FINDINGS: CT HEAD FINDINGS Brain: Normal anatomic configuration of the brain. 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 Tiny remote lacunar infarcts are noted within the left centrum semiovale. Small remote infarct within the left  cerebellar hemisphere. No acute intracranial hemorrhage or infarct. No abnormal mass effect or midline shift. No abnormal intra or extra-axial mass lesion. Ventricular size is normal. Cerebellum is otherwise unremarkable. Vascular: No hyperdense vessel or unexpected calcification. Skull: Normal. Negative for fracture or focal lesion. Sinuses/Orbits: The orbits are unremarkable. There is extensive mucosal thickening of the left maxillary sinus with layering fluid in keeping with possible changes of acute sinusitis. Mucosal thickening and layering fluid with central calcification is noted within the left sphenoid sinus in keeping with changes of chronic sinusitis in this location. Remaining paranasal sinuses are clear. Other: Mastoid air cells and middle ear cavities are clear. CT CERVICAL FINDINGS Alignment: Normal. Skull base and vertebrae: Craniocervical alignment is normal. The atlantodental interval is not widened. Anterior cervical discectomy and fusion with instrumentation of C5-C7 has been performed with solid incorporation of interbody bone graft and ankylosis of the right facet joints at these levels. There are bulky bridging anterior disc osteophytes noted at C2-C4. Ankylosis of the left facet joints of C2-3. No acute fracture of the cervical spine. Soft tissues and spinal canal: Similar prior examination, there is thickening of the anterior longitudinal ligament superiorly, particularly at C1-2 resulting in high-grade central canal stenosis and minimal remodeling of the thecal sac. This, in combination with  without pericholecystic inflammatory change. Liver unremarkable. No intra or extrahepatic biliary ductal dilation. Pancreas: Unremarkable Spleen: Unremarkable Adrenals/Urinary Tract: Adrenal glands are unremarkable. Kidneys are normal, without renal calculi, focal lesion, or hydronephrosis. Bladder is unremarkable. Stomach/Bowel: Stomach is within normal limits. Appendix absent. No evidence of bowel wall thickening, distention, or inflammatory changes. Vascular/Lymphatic: Extensive aortoiliac atherosclerotic calcification. No aortic aneurysm. No pathologic adenopathy within the abdomen and pelvis. Reproductive: Marked prostatic hypertrophy. Other: No abdominal wall hernia. Musculoskeletal: L4-5 lumbar fusion with instrumentation and posterior compression of L4 has been performed. No acute bone abnormality. No lytic or blastic bone lesion. IMPRESSION: 1. No acute intracranial  injury. No calvarial fracture. 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 3. No acute fracture or listhesis of the cervical spine. 4. 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 5. 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 Superimposed bilateral neuroforaminal narrowing. 6. No acute intrathoracic or intra-abdominal injury. 7. Extensive multi-vessel coronary artery calcification. 8. Cholelithiasis. 9. Marked prostatic hypertrophy. Aortic Atherosclerosis (ICD10-I70.0). Electronically Signed   By: Helyn Numbers M.D.   On: 07/22/2023 22:21   CT Cervical Spine Wo Contrast  Result Date: 07/09/2023 CLINICAL DATA:  unwitnessed arrest/fall; unwitnessed arrest; unwitnessed fall, cardiac arrest EXAM: CT HEAD WITHOUT CONTRAST CT CERVICAL SPINE WITHOUT CONTRAST CT CHEST, ABDOMEN AND PELVIS WITHOUT CONTRAST TECHNIQUE: Contiguous axial images were obtained from the base of the skull through the vertex without intravenous contrast. Multidetector CT imaging of the cervical spine was performed without intravenous contrast. Multiplanar CT image reconstructions were also generated. Multidetector CT imaging of the chest, abdomen and pelvis was performed following the standard protocol without IV contrast. RADIATION DOSE REDUCTION: This exam was performed according to the departmental dose-optimization program which includes automated exposure control, adjustment of the mA and/or kV according to patient size and/or use of iterative reconstruction technique. COMPARISON:  10/16/2022 FINDINGS: CT HEAD FINDINGS Brain: Normal anatomic configuration of the brain. 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 Tiny remote lacunar infarcts are noted within the left centrum semiovale. Small remote infarct within the left  cerebellar hemisphere. No acute intracranial hemorrhage or infarct. No abnormal mass effect or midline shift. No abnormal intra or extra-axial mass lesion. Ventricular size is normal. Cerebellum is otherwise unremarkable. Vascular: No hyperdense vessel or unexpected calcification. Skull: Normal. Negative for fracture or focal lesion. Sinuses/Orbits: The orbits are unremarkable. There is extensive mucosal thickening of the left maxillary sinus with layering fluid in keeping with possible changes of acute sinusitis. Mucosal thickening and layering fluid with central calcification is noted within the left sphenoid sinus in keeping with changes of chronic sinusitis in this location. Remaining paranasal sinuses are clear. Other: Mastoid air cells and middle ear cavities are clear. CT CERVICAL FINDINGS Alignment: Normal. Skull base and vertebrae: Craniocervical alignment is normal. The atlantodental interval is not widened. Anterior cervical discectomy and fusion with instrumentation of C5-C7 has been performed with solid incorporation of interbody bone graft and ankylosis of the right facet joints at these levels. There are bulky bridging anterior disc osteophytes noted at C2-C4. Ankylosis of the left facet joints of C2-3. No acute fracture of the cervical spine. Soft tissues and spinal canal: Similar prior examination, there is thickening of the anterior longitudinal ligament superiorly, particularly at C1-2 resulting in high-grade central canal stenosis and minimal remodeling of the thecal sac. This, in combination with

## 2023-07-09 NOTE — Progress Notes (Signed)
NAME:  Andre Jimenez, MRN:  [id number], DOB:  Sep 28, 1941, LOS: 1 ADMISSION DATE:  08/02/23, CONSULTATION DATE:  08-02-23 REFERRING MD:  Dr. Ledon Snare, CHIEF COMPLAINT:  VT arrest   History of Present Illness:  Patient is a 82 yo M w/ pertinent PMH systolic chf, Aortic stenosis s/p TAVR 01/2020, LBBB, CAD, htn, t2dm, ckd stage 3a presents to [facility] ED on 2023/08/02 for VT arrest.  On Aug 02, 2023 patient doing well during the day. Wife found him unresponsive in yard and called EMS and started CPR for about 12 minutes by wife until ems arrived. Initial rhythm vtach and was shocked. Patient had multiple round of cpr with intermittent ROSC about 4 times. Patient was shocked 2 times during cpr and intubated. Estimated total ROSC about 20 minutes. Unresponsive post rosc and transported to [facility] ED.   On arrival BP stable. EKG w/ LBBB and no ST elevation which is similar to previous EKG. Bedside echo w/ EF about 30% and no evidence of R heart strain. Patient intubated and sats stable on vent. CXR no significant findings. Remains unresponsive. CT head no acute abnormality. CT chest/abd/pelvis no acute findings. VBG ph 7.19 and co2 43. LA 6.7. K 3.2. Glucose 320. Trop 562. Cards consulted. PCCM consulted for icu admission.   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 Now requiring vasopressors.   Objective   Blood pressure (!) 95/55, pulse (!) 46, temperature (!) 96.1 F (35.6 C), resp. rate (!) 22, height 5\' 10"  (1.778 m), weight 82.8 kg, SpO2 99%.    Vent Mode: PRVC FiO2 (%):  [40 %-100 %] 40 % Set Rate:  [20 bmp-22 bmp] 22 bmp Vt Set:  [440 mL] 440 mL PEEP:  [5 cmH20] 5 cmH20 Plateau Pressure:  [14 cmH20-15 cmH20] 15 cmH20   Intake/Output Summary (Last 24 hours) at 07/09/2023 1520 Last data filed at 07/09/2023 1300 Gross per 24 hour  Intake 1616.17 ml  Output 1490 ml  Net 126.17 ml   Filed Weights   07/09/23 0100  Weight: 82.8 kg    Examination: General: Critically ill-appearing man lying in bed no acute distress HEENT: [place]/AT, eyes anicteric, endotracheal tube in place Neuro: RASS -5, pupils pinpoint, intact doll's eyes.  No corneals.  No cough or gag.  No withdrawal from pain.  No epileptiform discharges on EEG while I was in the room. CV: S1-S2, regular rate and rhythm, hypotensive PULM: No endotracheal secretions, CTAB. GI: Soft, nontender, nondistended, hypoactive bowel sounds Extremities: No significant edema, no cyanosis Skin: Warm, dry, no diffuse rashes   Bicarb 17  BUN 24 Creatinine 1.57 Troponin 1271 BNP 627 Lactic acid 2.9, downtrending Echo-LVEF 25 to 30%, global hypokinesis.  Moderately enlarged RV, low normal function.  Dilated atria.  Trivial MR.  No mitral stenosis.  Replaced aortic valve, no regurgitation or stenosis.  Resolved Hospital Problem list     Assessment & Plan:  VT arrest; suspect this is related to his chronic cardiomyopathy.  No reports of acute exacerbation of symptoms or worsening abdominal distention per family. Hypokalemia Acute on chronic HFrEF Aortic stenosis s/p TAVR 01/2020 Hx of  LBBB CAD -Telemonitoring - Amiodarone on hold due to bradycardia - Monitor electrolytes and replete as needed - Hold beta-blocker and GDMT while hypotensive -Appreciate cardiology's management. -trend troponin and lactate> low suspicion for ACS without regional wall motion abnormalities on echocardiogram -Continue heparin -TTM normothermia protocol in place; aggressively avoid fevers -con't aspirin; not on statin PTA  Lactic acidosis due to cardiac arrest - Trend -Maintain adequate perfusion  Acute respiratory failure with hypoxia postcardiac arrest  -Low tidal volume ventilation - VAP prevention protocol - PAD protocol for sedation; not weaning sedation until seizures are controlled. - Daily SAT and SBT as appropriate, not a candidate yet. - Titrate FiO2 as needed to maintain SpO2 greater than 90%. - Hold antibiotics without evidence of aspiration on chest x-ray, monitor carefully for increase in sputum production or fevers.  Acute kidney injury likely due to hypoperfusion from cardiac arrest on CKD 3a - Maintain adequate perfusion, goal MAP greater than 65 - Strict I's/O - Renally dose meds and avoid nephrotoxic meds  Acute encephalopathy: post arrest; concern for anoxic injury Myoclonic seizures - Appreciate neurology's management-loaded with valproate, started on propofol and Versed infusions.  Should not be weaned based on RASS score. -Continuous EEG per neurology.    HTN -Hold PTA Entresto, spironolactone, carvedilol while hypotensive.  Type 2 DM -Hold PTA metformin and Jardiance -Sliding scale insulin as needed - Goal blood glucose 140-180  Leukocytosis, most likely reactive -Trend, monitor off antibiotics, low threshold to start them due to concern for potential aspiration during cardiac arrest, although no radiographic evidence.  Patient's wife and son were updated at bedside.  They understand that he may have suffered a significant brain injury and having  seizures is an ominous sign.  Continue aggressive care at this point.  Discussed need for arterial line and central line to continue managing him aggressively.  They agree.   Best Practice (right click and "Reselect all SmartList Selections" daily)   Diet/type: NPO w/ meds via tube DVT prophylaxis: systemic heparin GI prophylaxis: PPI Lines: Central line and Arterial Line Foley:  Yes, and it is still needed Code Status:  full code Last date of multidisciplinary goals of care discussion [10/7 updated family at bedside]  Labs   CBC: Recent Labs  Lab 07/06/2023 2102 07/18/2023 2108 07/05/2023 2221 07/09/23 0041 07/09/23 0257  WBC 13.1*  --   --  15.2* 18.2*  HGB 13.6 14.3  14.3 14.3 12.3* 12.7*  HCT 43.9 42.0  42.0 42.0 38.3* 39.2  MCV 95.9  --   --  94.8 92.0  PLT 291  --   --  279 292    Basic Metabolic Panel: Recent Labs  Lab 07/04/23 1146 07/30/2023 2102 07/05/2023 2108 07/15/2023 2221 07/09/23 0041 07/09/23 0257 07/09/23 0752  NA 144 138 140  139 139 137 139 139  K 4.3 3.2* 3.2*  3.2* 3.9 3.7 3.7 3.8  CL 101 103 105  --  105 107 106  CO2 24 17*  --   --  17* 19* 17*  GLUCOSE 114* 320* 322*  --  261* 206* 158*  BUN 20 15 17   --  19 20 24*  CREATININE 1.20 1.39* 1.30*  --  1.43* 1.40* 1.57*  CALCIUM 9.1 7.7*  --   --  7.8* 8.0* 7.8*  MG  --   --   --   --  1.3* 2.2  --   PHOS  --   --   --   --   --  2.9  --    GFR: Estimated Creatinine Clearance: 38.1 mL/min (A) (by C-G formula based on SCr of 1.57 mg/dL (H)). Recent Labs  Lab 07/23/2023 2102 07/16/2023 2108 07/09/23 0039 07/09/23 0041 07/09/23 0257  WBC 13.1*  --   --  15.2* 18.2*  LATICACIDVEN  --  6.7* 3.8*  --  2.9*    Liver Function Tests: Recent Labs  Lab 07/04/23 1146 07/09/23 0039  AST 16 64*  ALT 8 67*  ALKPHOS 81 79  BILITOT 0.6 0.9  PROT 7.0 6.4*  ALBUMIN 3.9 3.4*   No results for input(s): "LIPASE", "AMYLASE" in the last 168 hours. No results for input(s): "AMMONIA" in the last 168  hours.  ABG    Component Value Date/Time   PHART 7.273 (L) 07/30/2023 2221   PCO2ART 37.6 07/25/2023 2221   PO2ART 479 (H) 07/12/2023 2221   HCO3 18.1 (L) 07/26/2023 2221   TCO2 19 (L) 07/30/2023 2221   ACIDBASEDEF 9.0 (H) 07/30/2023 2221   O2SAT 100 07/18/2023 2221      Critical care time:     This patient is critically ill with multiple organ system failure which requires frequent high complexity decision making, assessment, support, evaluation, and titration of therapies. This was completed through the application of advanced monitoring technologies and extensive interpretation of multiple databases. During this encounter critical care time was devoted to patient care services described in this note for 37 minutes.   Steffanie Dunn, DO 07/09/23 6:31 PM [facility]  For contact information, see Amion. If no response to pager, please call PCCM consult pager. After hours, 7PM- 7AM, please call Elink.

## 2023-07-09 NOTE — Progress Notes (Signed)
Arterial Catheter Insertion Procedure Note  Andre Jimenez  [id number]  1941-04-17  Date:07/09/23  Time:11:56 AM    Provider Performing: Lajean Manes    Procedure: Insertion of Arterial Line (24401) without US guidance  Indication(s) Blood pressure monitoring and/or need for frequent ABGs  Consent Unable to obtain consent due to inability to find a medical decision maker for patient.  All reasonable efforts were made.  Another independent medical provider, Dr. Chestine Spore , confirmed the benefits of this procedure outweigh the risks.  Anesthesia None   Time Out Verified patient identification, verified procedure, site/side was marked, verified correct patient position, special equipment/implants available, medications/allergies/relevant history reviewed, required imaging and test results available.   Sterile Technique Maximal sterile technique including full sterile barrier drape, hand hygiene, sterile gown, sterile gloves, mask, hair covering, sterile ultrasound probe cover (if used).   Procedure Description Area of catheter insertion was cleaned with chlorhexidine and draped in sterile fashion. Without real-time ultrasound guidance an arterial catheter was placed into the right radial artery.  Appropriate arterial tracings confirmed on monitor.     Complications/Tolerance None; patient tolerated the procedure well.   EBL Minimal   Specimen(s) None

## 2023-07-09 NOTE — Progress Notes (Signed)
eLink Physician-Brief Progress Note Patient Name: Andre Jimenez DOB: 1941-05-12 MRN: [id number]   Date of Service  07/09/2023  HPI/Events of Note  Patient is a 82 yo M w/ pertinent PMH systolic chf, Aortic stenosis s/p TAVR 01/2020, LBBB, CAD, htn, t2dm, ckd stage 3a presents to [facility] ED on 10/7 for VT arrest.   PICC line in place.  Currently on peripherally dosed norepinephrine at 10 mcg.  eICU Interventions  Switched to central dosing norepinephrine     Intervention Category Intermediate Interventions: Hypotension - evaluation and management  [name] 07/09/2023, 9:22 PM

## 2023-07-09 NOTE — TOC Initial Note (Signed)
Transition of Care [facility]) - Initial/Assessment Note    Patient Details  Name: Andre Jimenez MRN: [id number] Date of Birth: 15-Nov-1940  Transition of Care [facility]) CM/SW Contact:    Nicanor Bake Phone Number: [phone] 07/09/2023, 2:54 PM  Clinical Narrative:   [facility] CSW attempted to meet ith pt at bedside. Pt was in a sterile procedure.  CSW will meet with pt at a later time.   TOC will continue following.                      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   Acute on chronic diastolic heart failure (HCC) 02/12/2020   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   Severe aortic stenosis    Carotid artery disease (HCC)    Coronary artery disease involving native coronary artery of native heart with angina pectoris (HCC)    Essential hypertension    Type II diabetes mellitus (HCC)    Mixed hyperlipidemia    Gout    Vitamin D deficiency    Carpal tunnel syndrome - left    GERD (gastroesophageal reflux disease)    Bilateral hearing loss    PCP:  Hadley Pen, MD Pharmacy:   [facility] [address], [place] - [address] [place] [address] Phone: [phone] Fax: [phone]  [facility] - [place], [place] - [address] [place] [address] Phone: [phone] Fax:  [phone]     Social Determinants of Health (SDOH) Social History: SDOH Screenings   Food Insecurity: No Food Insecurity (08/10/2021)   Received from [facility]  Transportation Needs: Unknown (08/09/2021)   Received from [facility], [facility]  Financial Resource Strain: Low Risk  (08/09/2021)   Received from [facility], [facility]  Physical Activity: Unknown (08/09/2021)   Received from [facility], [facility]  Social Connections: Unknown (02/08/2022)   Received from [facility], [facility]  Tobacco Use: Medium Risk (07/04/2023)   SDOH Interventions:     Readmission Risk Interventions     No data to display

## 2023-07-09 NOTE — Progress Notes (Signed)
EEG complete - results pending 

## 2023-07-09 NOTE — Progress Notes (Signed)
Pt transported from ED trauma A to CT and back without complications.

## 2023-07-09 NOTE — Consult Note (Signed)
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 male with past medical history significant for coronary artery disease s/p stent, TAVR, severe aortic stenosis, left bundle branch block, hypertension, hyperlipidemia, type 2 diabetes, CKD stage IIIa, bilateral hearing loss, C-spine fusion  He was in his usual state of health today found unresponsive by family in the yard after he was heard to fall.  CPR for 12 minutes by wife per 911 instructions until EMS arrival with initial rhythm of V. tach and multiple rounds of CPR with intermittent ROSC 4 times, estimated total downtime 20 minutes.  Postintubation developed myoclonic jerking activity for which EEG was obtained which revealed myoclonic seizures.  Neurology is consulted for the same   ROS:  Unable to obtain due to altered mental status.   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   29 mm Medtronic Evolut Pro transcatheter heart valve placed via percutaneous left transfemoral approach    Severe aortic stenosis    Type II diabetes mellitus (HCC)    Type II   Vitamin D deficiency    Past Surgical History:  Procedure Laterality Date   CARPAL TUNNEL RELEASE Bilateral    CERVICAL FUSION     CORONARY PRESSURE/FFR STUDY N/A 01/28/2020   Procedure: INTRAVASCULAR PRESSURE WIRE/FFR STUDY;  Surgeon: Tonny Bollman, MD;  Location: [facility] INVASIVE CV LAB;  Service: Cardiovascular;   Laterality: N/A;   CORONARY STENT INTERVENTION N/A 01/28/2020   Procedure: CORONARY STENT INTERVENTION;  Surgeon: Tonny Bollman, MD;  Location: [facility] INVASIVE CV LAB;  Service: Cardiovascular;  Laterality: N/A;   ESOPHAGOGASTRODUODENOSCOPY (EGD) WITH ESOPHAGEAL DILATION     LUMBAR FUSION     RIGHT/LEFT HEART CATH AND CORONARY ANGIOGRAPHY N/A 03/01/2022   Procedure: RIGHT/LEFT HEART CATH AND CORONARY ANGIOGRAPHY;  Surgeon: Laurey Morale, MD;  Location: [facility] INVASIVE CV LAB;  Service: Cardiovascular;  Laterality: N/A;   Rotar cuff repair Right    TEE WITHOUT CARDIOVERSION N/A 02/09/2020   Procedure: TRANSESOPHAGEAL ECHOCARDIOGRAM (TEE);  Surgeon: Tonny Bollman, MD;  Location: [facility] INVASIVE CV LAB;  Service: Open Heart Surgery;  Laterality: N/A;   TRANSCATHETER AORTIC VALVE REPLACEMENT, TRANSFEMORAL Left 02/09/2020   Procedure: TRANSCATHETER AORTIC VALVE REPLACEMENT, LEFT TRANSFEMORAL;  Surgeon: Tonny Bollman, MD;  Location: [facility] INVASIVE CV LAB;  Service: Open Heart Surgery;  Laterality: Left;   Current Outpatient Medications  Medication Instructions   acetaminophen (TYLENOL) 1,000 mg, Oral, Every 6 hours PRN   aspirin EC 81 mg, Oral, Daily   atorvastatin (LIPITOR) 40 mg, Oral, Daily   carvedilol (COREG) 3.125 mg, Oral, 2 times daily   colchicine 0.6 MG tablet 2 tablets, Oral, As needed, For gout attacks   empagliflozin (JARDIANCE) 25 MG TABS tablet Take 12.5 mg (1/2 tablet) daily.   Fluocinolone Acetonide 0.01 % OIL 1 drop, OTIC (EAR), Daily PRN   furosemide (LASIX) 20 mg, Oral, Daily   magnesium oxide (MAGNESIUM-OXIDE) 400  mg, Oral, 2 times daily   metFORMIN (GLUCOPHAGE) 1,000 mg, Oral, 2 times daily with meals   Multiple Vitamins-Minerals (PRESERVISION AREDS 2+MULTI VIT) CAPS 1 capsule, Oral, Daily   pantoprazole (PROTONIX) 40 mg, Oral, Daily   sacubitril-valsartan (ENTRESTO) 97-103 MG 1 tablet, Oral, 2 times daily   spironolactone (ALDACTONE) 25 mg, Oral, Daily     Family History   Problem Relation Age of Onset   Diabetes Mother    Heart disease Father    Hypertension Father      Social History:  reports that he has quit smoking. His smoking use included cigars. He quit smokeless tobacco use about 4 years ago. He reports that he does not currently use alcohol. He reports that he does not currently use drugs.   Exam: Current vital signs: BP 114/69   Pulse (!) 53   Temp 98.1 F (36.7 C)   Resp 18   Ht 5\' 10"  (1.778 m)   Wt 82.8 kg   SpO2 100%   BMI 26.19 kg/m  Vital signs in last 24 hours: Temp:  [93 F (33.9 C)-98.5 F (36.9 C)] 98.1 F (36.7 C) (10/08 0330) Pulse Rate:  [50-74] 53 (10/08 0330) Resp:  [16-31] 18 (10/08 0330) BP: (95-132)/(57-94) 114/69 (10/08 0330) SpO2:  [99 %-100 %] 100 % (10/08 0330) FiO2 (%):  [40 %-100 %] 40 % (10/08 0010) Weight:  [82.8 kg] 82.8 kg (10/08 0100)   Physical Exam  Constitutional: Appears ill Psych: Minimally interactive Eyes: Scleral edema is absent   HENT: ET tube in place MSK: no major joint deformities.  Cardiovascular: Normal rate and regular rhythm on the monitor.  Respiratory: Double stacking breaths at times on the ventilator, breathing over the ventilator set rate GI: Soft.  No distension. There is no tenderness.  Skin: Warm dry and intact visible skin.  There is no significant edema  Neuro: Mental Status: Does not open eyes spontaneously, to voice or noxious stimulation Does not follow any commands Cranial Nerves: II: No blink to threat. Pupils are irregular (right teardrop shaped, left slightly irregular), both reactive to light approximately 2 mm to 1 mm III,IV, VI/VIII: EOMI absent VOR V/VII: Facial sensation is symmetric to eyelash brush VIII: No response to voice X/XI: Absent cough/gag XII: Unable to assess tongue protrusion secondary to patient's mental status  Motor/Sensory: Tone is normal. Bulk is normal.  No movement to noxious stim in any of the extremities   I have reviewed  labs in epic and the results pertinent to this consultation are:  Basic Metabolic Panel: Recent Labs  Lab 07/04/23 1146 07-12-2023 2102 07-12-23 2108 07-12-2023 2221 07/09/23 0041 07/09/23 0257  NA 144 138 140  139 139 137 139  K 4.3 3.2* 3.2*  3.2* 3.9 3.7 3.7  CL 101 103 105  --  105 107  CO2 24 17*  --   --  17* 19*  GLUCOSE 114* 320* 322*  --  261* 206*  BUN 20 15 17   --  19 20  CREATININE 1.20 1.39* 1.30*  --  1.43* 1.40*  CALCIUM 9.1 7.7*  --   --  7.8* 8.0*  MG  --   --   --   --  1.3* 2.2  PHOS  --   --   --   --   --  2.9    CBC: Recent Labs  Lab 07/12/2023 2102 2023-07-12 2108 2023/07/12 2221 07/09/23 0041 07/09/23 0257  WBC 13.1*  --   --  15.2*  18.2*  HGB 13.6 14.3  14.3 14.3 12.3* 12.7*  HCT 43.9 42.0  42.0 42.0 38.3* 39.2  MCV 95.9  --   --  94.8 92.0  PLT 291  --   --  279 292    Coagulation Studies: No results for input(s): "LABPROT", "INR" in the last 72 hours.    I have reviewed the images obtained:   Head CT personally reviewed, agree with radiology:   1. No acute intracranial injury. No calvarial fracture. 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 "Focal encephalomalacia within the right parieto-occipital cortex in keeping with remote infarct. Tiny remote lacunar infarcts are noted within the left centrum semiovale. Small remote infarct within the left cerebellar hemisphere."  CT CERVICAL SPINE WITHOUT CONTRAST CT CHEST, ABDOMEN AND PELVIS WITHOUT CONTRAST 3. No acute fracture or listhesis of the cervical spine. 4. 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 5. 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 Superimposed bilateral neuroforaminal narrowing. 6. No acute intrathoracic or intra-abdominal injury. 7. Extensive multi-vessel coronary artery calcification. 8.  Cholelithiasis. 9. Marked prostatic hypertrophy.   EEG  ABNORMALITY - Myoclonic seizure, generalized - Background suppression, generalized IMPRESSION: Patient was noted to have myoclonic seizures every 10 seconds to 1 minute.  Additionally there was evidence of profound diffuse encephalopathy.  In the setting of cardiac arrest, this EEG pattern is suggestive of anoxic/hypoxic brain injury.  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  However discussed with wife, no formal prognostication is possible at this time; need at least 3 days postevent for prognosis.  In the interim we will try to aggressively control seizures to optimize neurological recovery.  Did discuss with wife that his age and comorbidities as well as estimated downtime are very concerning factors.   Recommendations: -Keppra 4.5 g loading dose followed by 750 mg twice daily based on current creatinine clearance -Agree with propofol started by primary team -Midazolam started at 10 mg/h (rounded up from dose of 0.1 mg/kg/hr);  8 mg bolus ordered as needed seizure activity (could use 16 mg, but given   hypotension at this time and good response to 4 mg dose ordered by primary team we will start at a slightly lower dose) -Long term EEG monitoring -Neurology will follow along, appreciate excellent management of comorbidities per primary team   Brooke Dare MD-PhD [facility] [phone] Available 7 AM to 7 PM, outside these hours please contact Neurologist on call listed on AMION   Total critical care time: 35 minutes   Critical care time was exclusive of separately billable procedures and treating other patients.   Critical care was necessary to treat or prevent imminent or life-threatening deterioration.   Critical care was time spent personally by me on the  following activities: development of treatment plan with patient and/or surrogate as well as nursing, discussions with consultants/primary team, evaluation of patient's response to treatment, examination of patient, obtaining history from patient or surrogate, ordering and performing treatments and interventions, ordering and review of laboratory studies, ordering and review of radiographic studies, and re-evaluation of patient's condition as needed, as documented above.

## 2023-07-09 NOTE — Progress Notes (Addendum)
EEG showing myoclonic seizure every 10 seconds to 1 minutes suggestive of anoxic brain injury. Neuro consulted. Will load w/ keppra and give 4 mg versed. Will start on propofol drip. cEEG ordered.  Also trop up from 562 to 1,271. Will start on heparin drip. Cont to trend trop.  JD Anselm Lis [facility] 07/09/2023, 4:11 AM  Please see [email or web address] for pager details.  From 7A-7P if no response, please call [phone]. After hours, please call ELink [phone].

## 2023-07-09 NOTE — Progress Notes (Signed)
Heart Failure Navigator Progress Note  Assessed for Heart & Vascular TOC clinic readiness.  Patient does not meet criteria due to Advanced Heart Failure Team patient of Dr. McLean.   Navigator will sign off at this time.   Mahreen Schewe, BSN, RN Heart Failure Nurse Navigator Secure Chat Only   

## 2023-07-09 NOTE — Progress Notes (Signed)
PHARMACY - ANTICOAGULATION CONSULT NOTE  Pharmacy Consult for heparin Indication: chest pain/ACS  Allergies  Allergen Reactions   Ivp Dye [Iodinated Contrast Media] Anaphylaxis    Had anaphylactic reaction after TAVR- potentially related to contrast dye   Protamine Anaphylaxis    Had potential anaphylactic shock after protamine administration after TAVR    Patient Measurements: Height: 5\' 10"  (177.8 cm) Weight: 82.8 kg (182 lb 8.7 oz) IBW/kg (Calculated) : 73   Vital Signs: Temp: 96.1 F (35.6 C) (10/08 1400) Temp Source: Axillary (10/08 1400) BP: 95/55 (10/08 1300) Pulse Rate: 49 (10/08 1400)  Labs: Recent Labs    07/27/2023 2102 07/30/2023 2108 07/21/2023 2221 07/09/23 0039 07/09/23 0041 07/09/23 0257 07/09/23 0752 07/09/23 1210  HGB 13.6   < > 14.3  --  12.3* 12.7*  --   --   HCT 43.9   < > 42.0  --  38.3* 39.2  --   --   PLT 291  --   --   --  279 292  --   --   HEPARINUNFRC  --   --   --   --   --   --   --  0.70  CREATININE 1.39*   < >  --   --  1.43* 1.40* 1.57*  --   TROPONINIHS 562*  --   --  1,271*  --   --   --   --    < > = values in this interval not displayed.    Estimated Creatinine Clearance: 38.1 mL/min (A) (by C-G formula based on SCr of 1.57 mg/dL (H)).   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      aspirin EC 81 MG tablet Take 81 mg by mouth daily.      atorvastatin (LIPITOR) 40 MG tablet Take 1 tablet (40 mg total) by mouth daily. 30 tablet 3    carvedilol (COREG) 3.125 MG tablet Take 1 tablet (3.125 mg total) by mouth 2 (two) times daily. 180 tablet 3    colchicine 0.6 MG tablet Take 2 tablets by mouth as needed. For gout attacks      empagliflozin (JARDIANCE) 25 MG TABS tablet Take 12.5 mg (1/2 tablet) daily. 45 tablet 3    Fluocinolone Acetonide 0.01 % OIL Place 1 drop in ear(s) daily as needed (itching).      furosemide (LASIX) 40 MG tablet Take 0.5 tablets (20 mg total) by mouth daily. 45 tablet 3    MAGnesium-Oxide 400 (240 Mg) MG tablet Take 1 tablet (400 mg total) by mouth 2 (two) times  daily. 60 tablet 11    metFORMIN (GLUCOPHAGE) 1000 MG tablet Take 1,000 mg by mouth 2 (two) times daily with a meal.      Multiple Vitamins-Minerals (PRESERVISION AREDS 2+MULTI VIT) CAPS Take 1 capsule by mouth daily.      pantoprazole (PROTONIX) 40 MG tablet Take 1 tablet (40 mg total) by mouth daily. 90 tablet 3    sacubitril-valsartan (ENTRESTO) 97-103 MG Take 1 tablet by mouth 2 (two) times daily. 180 tablet 3    spironolactone (ALDACTONE) 25 MG tablet Take 1 tablet (25 mg total) by mouth daily. 90 tablet 3    Scheduled:   aspirin  325 mg Per Tube Daily   Chlorhexidine Gluconate Cloth  6 each Topical Daily   docusate  100 mg Per Tube BID   insulin aspart  0-15 Units Subcutaneous Q4H   mouth rinse  15 mL Mouth Rinse Q2H   pantoprazole (PROTONIX) IV  40 mg Intravenous Q24H   polyethylene glycol  17 g Per Tube Daily   potassium chloride  20 mEq Per Tube Once   sodium chloride flush  10-40 mL Intracatheter Q12H    Assessment: 82 yo male s/p cardiac arrest with VDRF on heparin for r/o ACS. He is not on an oral anticoagulant at home.    -heparin level= 0.7 on 1000 units/hr -hg= 12.7  Goal of Therapy:  Heparin level 0.3-0.7 units/ml Monitor platelets by anticoagulation protocol: Yes   Plan:  -Continue heparin at 1000 units/hr -Confirm heparin level tonight  Harland German, PharmD Clinical Pharmacist **Pharmacist phone directory can now be found on [email or web address] ([email or web address]).  Listed under [facility].

## 2023-07-09 NOTE — Progress Notes (Signed)
Neurology Progress Note  Brief HPI: 82 year old patient with history of CAD status post stenting, aortic stenosis, TAVR, left bundle branch block, hypertension, hyperlipidemia, diabetes, CKD stage IIIa, hearing loss and C-spine fusion who presented yesterday after being found unresponsive and pulseless.  CPR was performed first by family and then EMS, and patient was found to be in V. tach.  ROSC was achieved, and patient had a total downtime of about 20 minutes.  He developed myoclonic jerking after intubation, and EEG demonstrates frequent myoclonic seizures.  He was started on Keppra and Versed, and Versed was increased this morning and propofol was added as well as Depakote given continued myoclonic seizures.  Subjective: Patient is seen in his room with several family members at the bedside.  He is unresponsive to voice or noxious stimuli, and myoclonic seizures continue.  Exam: Vitals:   07/09/23 1100 07/09/23 1132  BP: (!) 86/53 (!) 95/53  Pulse: (!) 51 (!) 51  Resp: (!) 22 (!) 21  Temp: 98.4 F (36.9 C) 98.1 F (36.7 C)  SpO2: 98% 99%   Gen: In bed, NAD Resp: non-labored breathing, no acute distress Abd: soft, nt  Neuro (intubated on Versed): Patient is unresponsive to voice or noxious stimuli.  He is unable to follow commands.  Pupils are sluggish briefly reactive, right pupil is to drop shaped and irregular.  Oculocephalic reflex and corneal reflexes are absent, cough is weak, gag reflex is absent although he does grimace to deep suctioning.  He does not respond to noxious stimuli in any extremity.  Pertinent Labs:    Latest Ref Rng & Units 07/09/2023    2:57 AM 07/09/2023   12:41 AM 10-Jul-2023   10:21 PM  CBC  WBC 4.0 - 10.5 K/uL 18.2  15.2    Hemoglobin 13.0 - 17.0 g/dL 34.7  42.5  95.6   Hematocrit 39.0 - 52.0 % 39.2  38.3  42.0   Platelets 150 - 400 K/uL 292  279         Latest Ref Rng & Units 07/09/2023    7:52 AM 07/09/2023    2:57 AM 07/09/2023   12:41 AM  BMP   Glucose 70 - 99 mg/dL 387  564  332   BUN 8 - 23 mg/dL 24  20  19    Creatinine 0.61 - 1.24 mg/dL 9.51  8.84  1.66   Sodium 135 - 145 mmol/L 139  139  137   Potassium 3.5 - 5.1 mmol/L 3.8  3.7  3.7   Chloride 98 - 111 mmol/L 106  107  105   CO2 22 - 32 mmol/L 17  19  17    Calcium 8.9 - 10.3 mg/dL 7.8  8.0  7.8   Lactate 2.9  Imaging Reviewed:  CT head: No acute abnormality  EEG 10/8: Generalized myoclonic seizures every 10 seconds to 1 minute and background suppression  Assessment: 82 year old patient with history of CAD, aortic stenosis status post TAVR, hypertension, hyperlipidemia, CKD, hearing loss and C-spine fusion presents after sudden cardiac arrest at home with total downtime of about 20 minutes.  He was found to have myoclonic seizures on EEG post intubation.  He was loaded on Keppra and continuous Versed was started.  Seizures were continuing this morning on evaluation, so Versed was increased, propofol and Depakote were added.  Seizures appear to have stopped at this time.  Will need to get seizures under control and then wean sedation before attempting to perform an exam for prognostication.  Patient  will need brain MRI between 3 and 5 days after arrest.  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, however, prognostication cannot be performed at this time due to seizure activity and presence of multiple sedating medications  Recommendations: -Continue Keppra 750 mg twice daily -Continue Versed 20 mg/h -Load with Depakote 20mg /Kg -Start Depakote 250 mg IV every 6 -Depakote level tomorrow morning -Propofol 50 mg bolus followed by propofol 40 mcg/kg/min -Continue long-term EEG -Patient will need brain MRI 3 to 5 days after cardiac arrest  Cortney E Ernestina Columbia , MSN, AGACNP-BC [facility] See Amion for schedule and pager information 07/09/2023 12:28 PM  NEUROHOSPITALIST ADDENDUM Performed a face to face  diagnostic evaluation.   I have reviewed the contents of history and physical exam as documented by PA/ARNP/Resident and agree with above documentation.  I have discussed and formulated the above plan as documented. Edits to the note have been made as needed.  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 plan today is to escalate AEDs and sedation to abort myoclonic status. In between myoclonus, background appears suppressed.  Myoclonic status epilepticus in patients with cardiac arrest is generally associated with poor prognosis and severe brain injury.  This patient is critically ill and at significant risk of neurological worsening, death and care requires constant monitoring of vital signs, hemodynamics,respiratory and cardiac monitoring, neurological assessment, discussion with family, other specialists and medical decision making of high complexity. I spent 60 minutes of neurocritical care time  in the care of  this patient. This was time spent independent of any time provided by nurse practitioner or PA.  Erick Blinks [facility] 07/09/2023  2:02 PM   Erick Blinks, MD [facility] [phone]   If 7pm to 7am, please call on call as listed on AMION.

## 2023-07-09 NOTE — Procedures (Addendum)
Patient Name: BRYCETON HANTZ  MRN: [id number]  Epilepsy Attending: Charlsie Quest  Referring Physician/Provider: Lidia Collum, PA-C  Date: 07/09/2023 Duration: 25.34 mins  Patient history: 82yo M s/p cardiac arrest getting eeg to evaluate for seizure  Level of alertness: comatose  AEDs during EEG study: None  Technical aspects: This EEG study was done with scalp electrodes positioned according to the 10-20 International system of electrode placement. Electrical activity was reviewed with band pass filter of 1-70Hz , sensitivity of 7 uV/mm, display speed of 27mm/sec with a 60Hz  notched filter applied as appropriate. EEG data were recorded continuously and digitally stored.  Video monitoring was available and reviewed as appropriate.  Description: Patient was noted to have episodes of brief sudden whole body jerking every 10 seconds to 1 minute. Concomitant EEG showed generalized polyspikes consistent with myoclonic seizures. In between seizures EEG showed generalized background suppression. Hyperventilation and photic stimulation were not performed.     ABNORMALITY - Myoclonic seizure, generalized - Background suppression, generalized  IMPRESSION: Patient was noted to have myoclonic seizures every 10 seconds to 1 minute.  Additionally there was evidence of profound diffuse encephalopathy.  In the setting of cardiac arrest, this EEG pattern is suggestive of anoxic/hypoxic brain injury.  Ordering provider was notified.  Eirik Schueler Annabelle Harman

## 2023-07-09 NOTE — Progress Notes (Signed)
LTM maint complete - no skin breakdown seen. [facility] monitored, Event button test confirmed by [facility].

## 2023-07-09 NOTE — Progress Notes (Signed)
Levophed started through right forearm PIV( ultrasound guided) at 0452. IV watched placed.

## 2023-07-10 ENCOUNTER — Inpatient Hospital Stay (HOSPITAL_COMMUNITY): Payer: No Typology Code available for payment source

## 2023-07-10 DIAGNOSIS — I5022 Chronic systolic (congestive) heart failure: Secondary | ICD-10-CM | POA: Diagnosis not present

## 2023-07-10 DIAGNOSIS — I469 Cardiac arrest, cause unspecified: Secondary | ICD-10-CM | POA: Diagnosis not present

## 2023-07-10 DIAGNOSIS — N179 Acute kidney failure, unspecified: Secondary | ICD-10-CM | POA: Diagnosis not present

## 2023-07-10 DIAGNOSIS — R569 Unspecified convulsions: Secondary | ICD-10-CM

## 2023-07-10 DIAGNOSIS — J9601 Acute respiratory failure with hypoxia: Secondary | ICD-10-CM | POA: Diagnosis not present

## 2023-07-10 DIAGNOSIS — J690 Pneumonitis due to inhalation of food and vomit: Secondary | ICD-10-CM

## 2023-07-10 DIAGNOSIS — Z9911 Dependence on respirator [ventilator] status: Secondary | ICD-10-CM | POA: Diagnosis not present

## 2023-07-10 LAB — CBC
HCT: 39.4 % (ref 39.0–52.0)
Hemoglobin: 12.8 g/dL — ABNORMAL LOW (ref 13.0–17.0)
MCH: 30.6 pg (ref 26.0–34.0)
MCHC: 32.5 g/dL (ref 30.0–36.0)
MCV: 94.3 fL (ref 80.0–100.0)
Platelets: 285 10*3/uL (ref 150–400)
RBC: 4.18 MIL/uL — ABNORMAL LOW (ref 4.22–5.81)
RDW: 13.1 % (ref 11.5–15.5)
WBC: 13.5 10*3/uL — ABNORMAL HIGH (ref 4.0–10.5)
nRBC: 0 % (ref 0.0–0.2)

## 2023-07-10 LAB — COOXEMETRY PANEL
Carboxyhemoglobin: 1.3 % (ref 0.5–1.5)
Carboxyhemoglobin: 0.8 % (ref 0.5–1.5)
Carboxyhemoglobin: 1.6 % — ABNORMAL HIGH (ref 0.5–1.5)
Methemoglobin: 0.7 % (ref 0.0–1.5)
Methemoglobin: 0.7 % (ref 0.0–1.5)
Methemoglobin: <0.7 % (ref 0.0–1.5)
O2 Saturation: 91 %
O2 Saturation: 90.4 %
O2 Saturation: 89.6 %
Total hemoglobin: 12.1 g/dL (ref 12.0–16.0)
Total hemoglobin: 11.6 g/dL — ABNORMAL LOW (ref 12.0–16.0)
Total hemoglobin: 12.5 g/dL (ref 12.0–16.0)

## 2023-07-10 LAB — POCT I-STAT 7, (LYTES, BLD GAS, ICA,H+H)
Acid-base deficit: 4 mmol/L — ABNORMAL HIGH (ref 0.0–2.0)
Bicarbonate: 22.9 mmol/L (ref 20.0–28.0)
Calcium, Ion: 1.12 mmol/L — ABNORMAL LOW (ref 1.15–1.40)
HCT: 37 % — ABNORMAL LOW (ref 39.0–52.0)
Hemoglobin: 12.6 g/dL — ABNORMAL LOW (ref 13.0–17.0)
O2 Saturation: 99 %
Patient temperature: 37.1
Potassium: 3.5 mmol/L (ref 3.5–5.1)
Sodium: 145 mmol/L (ref 135–145)
TCO2: 24 mmol/L (ref 22–32)
pCO2 arterial: 50.7 mm[Hg] — ABNORMAL HIGH (ref 32–48)
pH, Arterial: 7.263 — ABNORMAL LOW (ref 7.35–7.45)
pO2, Arterial: 151 mm[Hg] — ABNORMAL HIGH (ref 83–108)

## 2023-07-10 LAB — TRIGLYCERIDES: Triglycerides: 123 mg/dL (ref <150)

## 2023-07-10 LAB — PHOSPHORUS: Phosphorus: 3 mg/dL (ref 2.5–4.6)

## 2023-07-10 LAB — COMPREHENSIVE METABOLIC PANEL
ALT: 44 U/L (ref 0–44)
AST: 33 U/L (ref 15–41)
Albumin: 3 g/dL — ABNORMAL LOW (ref 3.5–5.0)
Alkaline Phosphatase: 70 U/L (ref 38–126)
Anion gap: 14 (ref 5–15)
BUN: 24 mg/dL — ABNORMAL HIGH (ref 8–23)
CO2: 20 mmol/L — ABNORMAL LOW (ref 22–32)
Calcium: 7.9 mg/dL — ABNORMAL LOW (ref 8.9–10.3)
Chloride: 110 mmol/L (ref 98–111)
Creatinine, Ser: 1.88 mg/dL — ABNORMAL HIGH (ref 0.61–1.24)
GFR, Estimated: 35 mL/min — ABNORMAL LOW (ref >60)
Glucose, Bld: 141 mg/dL — ABNORMAL HIGH (ref 70–99)
Potassium: 3.4 mmol/L — ABNORMAL LOW (ref 3.5–5.1)
Sodium: 144 mmol/L (ref 135–145)
Total Bilirubin: 0.8 mg/dL (ref 0.3–1.2)
Total Protein: 6.3 g/dL — ABNORMAL LOW (ref 6.5–8.1)

## 2023-07-10 LAB — LACTIC ACID, PLASMA: Lactic Acid, Venous: 2.5 mmol/L (ref 0.5–1.9)

## 2023-07-10 LAB — GLUCOSE, CAPILLARY
Glucose-Capillary: 141 mg/dL — ABNORMAL HIGH (ref 70–99)
Glucose-Capillary: 145 mg/dL — ABNORMAL HIGH (ref 70–99)
Glucose-Capillary: 124 mg/dL — ABNORMAL HIGH (ref 70–99)
Glucose-Capillary: 104 mg/dL — ABNORMAL HIGH (ref 70–99)
Glucose-Capillary: 99 mg/dL (ref 70–99)
Glucose-Capillary: 120 mg/dL — ABNORMAL HIGH (ref 70–99)

## 2023-07-10 LAB — MAGNESIUM
Magnesium: 2.1 mg/dL (ref 1.7–2.4)
Magnesium: 1.6 mg/dL — ABNORMAL LOW (ref 1.7–2.4)

## 2023-07-10 LAB — PROCALCITONIN: Procalcitonin: 0.33 ng/mL

## 2023-07-10 LAB — HEPARIN LEVEL (UNFRACTIONATED): Heparin Unfractionated: 0.45 [IU]/mL (ref 0.30–0.70)

## 2023-07-10 LAB — VALPROIC ACID LEVEL: Valproic Acid Lvl: 46 ug/mL — ABNORMAL LOW (ref 50.0–100.0)

## 2023-07-10 MED ORDER — VANCOMYCIN HCL 1750 MG/350ML IV SOLN
1750.0000 mg | Freq: Once | INTRAVENOUS | Status: AC
  Administered 2023-07-10: 1750 mg via INTRAVENOUS
  Filled 2023-07-10: qty 350

## 2023-07-10 MED ORDER — AMIODARONE HCL 200 MG PO TABS
200.0000 mg | ORAL_TABLET | Freq: Two times a day (BID) | ORAL | Status: DC
  Administered 2023-07-10 – 2023-07-13 (×7): 200 mg
  Filled 2023-07-10 (×7): qty 1

## 2023-07-10 MED ORDER — SODIUM CHLORIDE 0.9 % IV SOLN
2.0000 g | Freq: Once | INTRAVENOUS | Status: AC
  Administered 2023-07-10: 2 g via INTRAVENOUS
  Filled 2023-07-10: qty 12.5

## 2023-07-10 MED ORDER — CALCIUM GLUCONATE-NACL 1-0.675 GM/50ML-% IV SOLN
INTRAVENOUS | Status: AC
  Filled 2023-07-10: qty 50

## 2023-07-10 MED ORDER — PROSOURCE TF20 ENFIT COMPATIBL EN LIQD
60.0000 mL | Freq: Every day | ENTERAL | Status: DC
  Administered 2023-07-10 – 2023-07-12 (×3): 60 mL
  Filled 2023-07-10 (×3): qty 60

## 2023-07-10 MED ORDER — ATORVASTATIN CALCIUM 40 MG PO TABS: 40.0000 mg | ORAL_TABLET | Freq: Every day | ORAL | Status: DC

## 2023-07-10 MED ORDER — SODIUM CHLORIDE 0.9 % IV SOLN
2.0000 g | Freq: Two times a day (BID) | INTRAVENOUS | Status: DC
  Administered 2023-07-11 – 2023-07-13 (×5): 2 g via INTRAVENOUS
  Filled 2023-07-10 (×5): qty 12.5

## 2023-07-10 MED ORDER — POTASSIUM CHLORIDE 20 MEQ PO PACK
40.0000 meq | PACK | Freq: Once | ORAL | Status: AC
  Administered 2023-07-10: 40 meq
  Filled 2023-07-10: qty 2

## 2023-07-10 MED ORDER — CALCIUM GLUCONATE-NACL 1-0.675 GM/50ML-% IV SOLN
1.0000 g | Freq: Once | INTRAVENOUS | Status: AC
  Administered 2023-07-10: 1000 mg via INTRAVENOUS

## 2023-07-10 MED ORDER — VITAL AF 1.2 CAL PO LIQD
1000.0000 mL | ORAL | Status: DC
  Administered 2023-07-10 – 2023-07-11 (×2): 1000 mL

## 2023-07-10 MED ORDER — VASOPRESSIN 20 UNITS/100 ML INFUSION FOR SHOCK
0.0000 [IU]/min | INTRAVENOUS | Status: DC
  Administered 2023-07-10 – 2023-07-11 (×3): 0.03 [IU]/min via INTRAVENOUS
  Filled 2023-07-10 (×3): qty 100

## 2023-07-10 MED ORDER — VANCOMYCIN HCL 1750 MG/350ML IV SOLN: 1750.0000 mg | INTRAVENOUS | Status: DC

## 2023-07-10 MED ORDER — ATORVASTATIN CALCIUM 40 MG PO TABS
40.0000 mg | ORAL_TABLET | Freq: Every day | ORAL | Status: DC
  Administered 2023-07-10 – 2023-07-13 (×4): 40 mg
  Filled 2023-07-10 (×4): qty 1

## 2023-07-10 NOTE — TOC Initial Note (Signed)
Transition of Care [facility]) - Initial/Assessment Note    Patient Details  Name: Andre Jimenez MRN: [id number] Date of Birth: 04-14-1941  Transition [facility] Care [facility]) CM/SW Contact:    Elliot Cousin, RN Phone Number: [phone] 07/10/2023, 1:24 PM  Clinical Narrative: HF TOC CM spoke to wife, son, and DIL at bedside. Pt was independent PTA. Will continue to follow for dc needs.  [facility] centralization line referral [id number], notification ID # [id number].                 Expected Discharge Plan: IP Rehab Facility Barriers to Discharge: Continued Medical Work up   Patient Goals and CMS Choice Patient states their goals for this hospitalization and ongoing recovery are:: wants patient to recover          Expected Discharge Plan and Services   Discharge Planning Services: CM Consult   Living arrangements for the past 2 months: Single Family Home                                      Prior Living Arrangements/Services Living arrangements for the past 2 months: Single Family Home Lives with:: Spouse Patient language and need for interpreter reviewed:: Yes Do you feel safe going back to the place where you live?: Yes          Current home services: DME (rolling walker, cane)    Activities of Daily Living      Permission Sought/Granted Permission sought to share information with : Case Manager, Family Supports, PCP Permission granted to share information with : Yes, Verbal Permission Granted  Share Information with NAME: Andre Jimenez     Permission granted to share info w Relationship: wife  Permission granted to share info w Contact Information: [phone]  Emotional Assessment   Attitude/Demeanor/Rapport: Intubated (Following Commands or Not Following Commands)          Admission diagnosis:  Cardiac arrest [facility]) [I46.9] Patient Active Problem List   Diagnosis Date Noted   Cardiac arrest (HCC) 07/20/2023   Acute respiratory failure  (HCC) 07/04/2023   Carpal tunnel syndrome on left    Arthritis    Anaphylaxis 02/12/2020   Acute on chronic diastolic heart failure (HCC) 02/12/2020   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   Severe aortic stenosis    Carotid artery disease (HCC)    Coronary artery disease involving native coronary artery of native heart with angina pectoris (HCC)    Essential hypertension    Type II diabetes mellitus (HCC)    Mixed hyperlipidemia    Gout    Vitamin D deficiency    Carpal tunnel syndrome - left    GERD (gastroesophageal reflux disease)    Bilateral hearing loss    PCP:  Hadley Pen, MD Pharmacy:   [facility] [address], [place] - [address] [place] [address] Phone: [phone] Fax: [phone]  [facility] - [place], [place] - [address] [place] [address] Phone: [phone] Fax: [phone]     Social Determinants of Health (SDOH) Social History: SDOH Screenings   Food Insecurity: No Food Insecurity (08/10/2021)   Received from [facility]  Transportation Needs: Unknown (08/09/2021)   Received from [facility], [facility]  Financial Resource Strain: Low Risk  (08/09/2021)   Received from [facility]  Physical Activity: Unknown (08/09/2021)   Received from [facility], [facility]  Social Connections: Unknown (02/08/2022)   Received from [facility], [facility]  Tobacco Use: Medium Risk (07/04/2023)   SDOH Interventions:     Readmission Risk Interventions     No data to display

## 2023-07-10 NOTE — Progress Notes (Signed)
Neurology Progress Note  Brief HPI: 82 year old patient with history of CAD status post stenting, aortic stenosis, TAVR, left bundle branch block, hypertension, hyperlipidemia, diabetes, CKD stage IIIa, hearing loss and C-spine fusion who presented yesterday after being found unresponsive and pulseless.  CPR was performed first by family and then EMS, and patient was found to be in V. tach.  ROSC was achieved, and patient had a total downtime of about 20 minutes.  He developed myoclonic jerking after intubation, and EEG demonstrates frequent myoclonic seizures.  He was started on Keppra and Versed, and Versed was increased this morning and propofol was added as well as Depakote given continued myoclonic seizures.  Subjective: Patient is seen in his room with wife and critical care team at the bedside.    Exam: Vitals:   07/10/23 0600 07/10/23 0700  BP: (!) 125/52 (!) 122/54  Pulse: (!) 52 (!) 51  Resp: (!) 22 (!) 22  Temp: 97.7 F (36.5 C) 98.1 F (36.7 C)  SpO2: 99% 99%   Gen: In bed, NAD Resp: non-labored breathing, no acute distress Abd: soft, nt  Neuro (intubated on Propofol, Fentanyl, Versed): Patient is unresponsive to voice or noxious stimuli.  He is unable to follow commands.  Pupils are sluggish briefly reactive, right pupil is to drop shaped and irregular.  Oculocephalic reflex and corneal reflexes are absent, cough is weak, gag reflex is absent although he does grimace to deep suctioning.  He does not respond to noxious stimuli in any extremity.  Pertinent Labs:    Latest Ref Rng & Units 07/10/2023    3:10 AM 07/09/2023    2:57 AM 07/09/2023   12:41 AM  CBC  WBC 4.0 - 10.5 K/uL 13.5  18.2  15.2   Hemoglobin 13.0 - 17.0 g/dL 78.2  95.6  21.3   Hematocrit 39.0 - 52.0 % 39.4  39.2  38.3   Platelets 150 - 400 K/uL 285  292  279        Latest Ref Rng & Units 07/10/2023    3:10 AM 07/09/2023    7:52 AM 07/09/2023    2:57 AM  BMP  Glucose 70 - 99 mg/dL 086  578  469   BUN 8 -  23 mg/dL 24  24  20    Creatinine 0.61 - 1.24 mg/dL 6.29  5.28  4.13   Sodium 135 - 145 mmol/L 144  139  139   Potassium 3.5 - 5.1 mmol/L 3.4  3.8  3.7   Chloride 98 - 111 mmol/L 110  106  107   CO2 22 - 32 mmol/L 20  17  19    Calcium 8.9 - 10.3 mg/dL 7.9  7.8  8.0   Lactate 2.9  Imaging Reviewed:  CT head: No acute abnormality  EEG 10/8: Generalized myoclonic seizures every 10 seconds to 1 minute and background suppression  Assessment: 82 year old patient with history of CAD, aortic stenosis status post TAVR, hypertension, hyperlipidemia, CKD, hearing loss and C-spine fusion presents after sudden cardiac arrest at home with total downtime of about 20 minutes.  He was found to have myoclonic seizures on EEG post intubation.  He was loaded on Keppra and continuous Versed was started.  Seizures were continuing this morning on evaluation, so Versed was increased, propofol and Depakote were added.  Seizures appear to have stopped at this time.  Will need to get seizures under control and then wean sedation before attempting to perform an exam for prognostication.  Patient will need brain  MRI between 3 and 5 days after arrest.  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 -Continue Depakote 250 mg IV every 6 -Propofol 50 mg bolus followed by propofol 40 mcg/kg/min -Continue long-term EEG -Patient will need brain MRI 3 to 5 days after cardiac arrest (10/10-10/12)  Patient seen and examined by NP/APP with MD. MD to update note as needed.   Elmer Picker, DNP, FNP-BC [facility] Pager: [phone]  NEUROHOSPITALIST ADDENDUM Performed a face to face diagnostic evaluation.   I have  reviewed the contents of history and physical exam as documented by PA/ARNP/Resident and agree with above documentation.  I have discussed and formulated the above plan as documented. Edits to the note have been made as needed.  Impression/Key exam findings/Plan: myoclonic seizures aborted with Keppra, VPA, Versed and Propofol. He appears oversedated on EEG which is essentially flat. Will stop Versed and wean down propofol by 10/Hr in the afternoon. Leave on LTM as we are weaning off sedation.  This patient is critically ill and at significant risk of neurological worsening, death and care requires constant monitoring of vital signs, hemodynamics,respiratory and cardiac monitoring, neurological assessment, discussion with family, other specialists and medical decision making of high complexity. I spent 40 minutes of neurocritical care time  in the care of  this patient. This was time spent independent of any time provided by nurse practitioner or PA.  Erick Blinks [facility] 07/10/2023  3:27 PM   Erick Blinks, MD [facility] [phone]   If 7pm to 7am, please call on call as listed on AMION.

## 2023-07-10 NOTE — Progress Notes (Addendum)
Pharmacy Antibiotic Note  Andre Jimenez is a 82 y.o. male being treated for sepsis.  Pharmacy has been consulted for vancomycin and cefepime dosing. Patient was brought by EMS after cardiac arrest at home. Patient was down for 20-40 minutes before EMS arrival. Patient's vasopressor requirements have increased in the last 24 hours and lactate has not cleared. Will initiate empiric antibiotics for sepsis coverage and obtain blood cultures. With vancomycin dosing will target an AUC of 400-500.  Plan: Vancomycin 1750 mg every 48 hours Cefepime 2000 mg every 12 hours  Height: 5\' 10"  (177.8 cm) Weight: 83.9 kg (184 lb 15.5 oz) IBW/kg (Calculated) : 73  Temp (24hrs), Avg:97.6 F (36.4 C), Min:94.6 F (34.8 C), Max:100.6 F (38.1 C)  Recent Labs  Lab 07/12/2023 2102 07/25/2023 2108 07/09/23 0039 07/09/23 0041 07/09/23 0257 07/09/23 0752 07/10/23 0310  WBC 13.1*  --   --  15.2* 18.2*  --  13.5*  CREATININE 1.39* 1.30*  --  1.43* 1.40* 1.57* 1.88*  LATICACIDVEN  --  6.7* 3.8*  --  2.9*  --  2.5*    Estimated Creatinine Clearance: 31.8 mL/min (A) (by C-G formula based on SCr of 1.88 mg/dL (H)).    Allergies  Allergen Reactions   Ivp Dye [Iodinated Contrast Media] Anaphylaxis    Had anaphylactic reaction after TAVR- potentially related to contrast dye   Protamine Anaphylaxis    Had potential anaphylactic shock after protamine administration after TAVR    Antimicrobials this admission: Vancomycin 10/9 >>  Cefepime 10/9 >>   Dose adjustments this admission:   Microbiology results: 10/8 MRSA PCR: negative  Thank you for allowing pharmacy to be a part of this patient's care.  Wilmer Floor 07/10/2023 2:49 PM

## 2023-07-10 NOTE — Progress Notes (Signed)
[facility] ADULT ICU REPLACEMENT PROTOCOL   The patient does apply for the [facility] Adult ICU Electrolyte Replacment Protocol based on the criteria listed below:   1.Exclusion criteria: TCTS, ECMO, Dialysis, and Myasthenia Gravis patients 2. Is GFR >/= 30 ml/min? Yes.    Patient's GFR today is 35 3. Is SCr </= 2? Yes.   Patient's SCr is 1.88 mg/dL 4. Did SCr increase >/= 0.5 in 24 hours? No. 5.Pt's weight >40kg  Yes.   6. Abnormal electrolyte(s): potassium 3.4  7. Electrolytes replaced per protocol 8.  Call MD STAT for K+ </= 2.5, Phos </= 1, or Mag </= 1 Physician:  protocol  Melvern Banker 07/10/2023 4:40 AM

## 2023-07-10 NOTE — Progress Notes (Signed)
PHARMACY - ANTICOAGULATION CONSULT NOTE  Pharmacy Consult for heparin Indication: chest pain/ACS  Allergies  Allergen Reactions   Ivp Dye [Iodinated Contrast Media] Anaphylaxis    Had anaphylactic reaction after TAVR- potentially related to contrast dye   Protamine Anaphylaxis    Had potential anaphylactic shock after protamine administration after TAVR    Patient Measurements: Height: 5\' 10"  (177.8 cm) Weight: 83.9 kg (184 lb 15.5 oz) IBW/kg (Calculated) : 73   Vital Signs: Temp: 98.6 F (37 C) (10/09 0753) Temp Source: Bladder (10/09 0400) BP: 122/54 (10/09 0700) Pulse Rate: 60 (10/09 0753)  Labs: Recent Labs    07/11/2023 2102 07/31/2023 2108 07/09/23 0039 07/09/23 0041 07/09/23 0257 07/09/23 0752 07/09/23 1210 07/09/23 2122 07/10/23 0310  HGB 13.6   < >  --  12.3* 12.7*  --   --   --  12.8*  HCT 43.9   < >  --  38.3* 39.2  --   --   --  39.4  PLT 291  --   --  279 292  --   --   --  285  HEPARINUNFRC  --   --   --   --   --   --  0.70 0.61 0.45  CREATININE 1.39*   < >  --  1.43* 1.40* 1.57*  --   --  1.88*  TROPONINIHS 562*  --  1,271*  --   --   --   --   --   --    < > = values in this interval not displayed.    Estimated Creatinine Clearance: 31.8 mL/min (A) (by C-G formula based on SCr of 1.88 mg/dL (H)).   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      aspirin EC 81 MG tablet Take 81 mg by mouth daily.      atorvastatin (LIPITOR) 40 MG tablet Take 1 tablet (40 mg total) by mouth daily. 30 tablet 3    carvedilol (COREG) 3.125 MG tablet Take 1 tablet (3.125 mg total) by mouth 2 (two) times daily. 180 tablet 3    colchicine 0.6 MG tablet Take 2 tablets by mouth as needed. For gout attacks      empagliflozin (JARDIANCE) 25 MG TABS tablet Take 12.5 mg (1/2 tablet) daily. 45 tablet 3    Fluocinolone Acetonide 0.01 % OIL Place 1 drop in ear(s) daily as needed (itching).      furosemide (LASIX) 40 MG tablet Take 0.5 tablets (20 mg total) by mouth daily. 45 tablet 3    MAGnesium-Oxide 400 (240 Mg) MG  tablet Take 1 tablet (400 mg total) by mouth 2 (two) times daily. 60 tablet 11    metFORMIN (GLUCOPHAGE) 1000 MG tablet Take 1,000 mg by mouth 2 (two) times daily with a meal.      Multiple Vitamins-Minerals (PRESERVISION AREDS 2+MULTI VIT) CAPS Take 1 capsule by mouth daily.      pantoprazole (PROTONIX) 40 MG tablet Take 1 tablet (40 mg total) by mouth daily. 90 tablet 3    sacubitril-valsartan (ENTRESTO) 97-103 MG Take 1 tablet by mouth 2 (two) times daily. 180 tablet 3    spironolactone (ALDACTONE) 25 MG tablet Take 1 tablet (25 mg total) by mouth daily. 90 tablet 3    Scheduled:   aspirin  81 mg Per Tube Daily   Chlorhexidine Gluconate Cloth  6 each Topical Daily   docusate  100 mg Per Tube BID   insulin aspart  0-15 Units Subcutaneous Q4H   mouth rinse  15 mL Mouth Rinse Q2H   pantoprazole (PROTONIX) IV  40 mg Intravenous Q24H   polyethylene glycol  17 g Per Tube Daily   potassium chloride  20 mEq Per Tube Once   sodium chloride flush  10-40 mL Intracatheter Q12H    Assessment: 82 yo male s/p cardiac arrest with VDRF on heparin for r/o ACS.  He is not on an oral anticoagulant at home.   -heparin level= 0.45 (-0.16) on 1000 units/hr -hg= 12.8 (+0.1) -plt= 285 (-7) -no s/sx of bleeding, per RN report  Goal of Therapy:  Heparin level 0.3-0.7 units/ml Monitor platelets by anticoagulation protocol: Yes   Plan:  -Increase heparin infusion to 1100 units/hr -No plans to recheck heparin level today -Monitor daily CBC, heparin level, and for s/sx of bleeding -F/u cardiology recommendations   Wilmer Floor, PharmD PGY2 Cardiology Pharmacy Resident  07/10/2023 8:29 AM   Please refer to AMION for pharmacy phone number

## 2023-07-10 NOTE — Progress Notes (Signed)
NAME:  Andre Jimenez, MRN:  [id number], DOB:  1941/08/13, LOS: 2 ADMISSION DATE:  07/20/2023, CONSULTATION DATE:  10/7 REFERRING MD:  Dr. Ledon Snare, CHIEF COMPLAINT:  VT arrest   History of Present Illness:  Patient is a 82 yo M w/ pertinent PMH systolic chf, Aortic stenosis s/p TAVR 01/2020, LBBB, CAD, htn, t2dm, ckd stage 3a presents to [facility] ED on 10/7 for VT arrest.  On 10/7 patient doing well during the day. Wife found him unresponsive in yard and called EMS and started CPR for about 12 minutes by wife until ems arrived. Initial rhythm vtach and was shocked. Patient had multiple round of cpr with intermittent ROSC about 4 times. Patient was shocked 2 times during cpr and intubated. Estimated total ROSC about 20 minutes. Unresponsive post rosc and transported to [facility] ED.   On arrival BP stable. EKG w/ LBBB and no ST elevation which is similar to previous EKG. Bedside echo w/ EF about 30% and no evidence of R heart strain. Patient intubated and sats stable on vent. CXR no significant findings. Remains unresponsive. CT head no acute abnormality. CT chest/abd/pelvis no acute findings. VBG ph 7.19 and co2 43. LA 6.7. K 3.2. Glucose 320. Trop 562. Cards consulted. PCCM consulted for icu admission.   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 Now requiring vasopressors.   Interim History / Subjective:  No further clinical seizures noted, remains sedated on versed, fentanyl, propofol gtt on cEEG NE requirements up  CVP 6- 8   Objective   Blood pressure (!) 122/54, pulse 60, temperature 98.6 F (37 C), resp. rate (!) 22, height 5\' 10"  (1.778 m), weight 83.9 kg, SpO2 98%. CVP:  [3 mmHg-17 mmHg] 8 mmHg  Vent Mode: PRVC FiO2 (%):  [40 %] 40 % Set Rate:  [22 bmp] 22 bmp Vt Set:  [440 mL] 440 mL PEEP:  [5 cmH20] 5 cmH20 Plateau Pressure:  [15 cmH20] 15 cmH20   Intake/Output Summary (Last 24 hours) at 07/10/2023 0828 Last data filed at 07/10/2023 0700 Gross per 24 hour  Intake 2886.98 ml  Output 4230 ml  Net -1343.02 ml   Filed Weights   07/09/23 0100 07/10/23 0233  Weight: 82.8 kg 83.9 kg    Examination: Propofol 40, versed 20mg , fent 125 General:  critically ill elderly male sedated on MV in NAD HEENT: MM pink/moist, ETT, OGT, pupils 2/non reactive, trace scleral edema on right, absent corneals Neuro: sedated, unresponsive CV: rr ir, SB- , PICC and aline RUE PULM:  non labored, not breathing over set rate, clear, no secretions, absent cough GI: soft,  bs+, NT, foley Extremities: warm/dry, no LE edema  Skin: no rashes  Labs > k 3.4, bicarb 17> 20, sCr 1.57> 1.88, BUN 24, normalized AST/ ALT, down trending LA, WBC 18.2> 13.5 Coox 91%, CVP 6-8  UOP 3.3L/ 24hrs after diureses yest -1.4L/ 24hrs Net -986 ml  Echo-LVEF 25 to 30%, global hypokinesis.  Moderately enlarged RV, low normal function.  Dilated atrium.  Trivial MR.  No mitral stenosis.  Replaced aortic valve, no regurgitation or stenosis. No RWA  Resolved Hospital Problem list     Assessment & Plan:  VT arrest - suspect this is related to his chronic cardiomyopathy and Mag 1.3 on admit.  No reports  of acute exacerbation of symptoms or worsening abdominal distention per family. Chronic HFrEF, cardiomyopathy EF 25-30% (previously 35-40%) Aortic stenosis s/p TAVR 01/2020 Hx of LBBB CAD - cont ICU/ tele monitoring - appreciate HF assistance - cont NE for goal MAP > 65.  Suspect increased pressor needs 2/2 to sedation requirements and low grade temp overnight reactive to his myoclonus.  WBC decreasing.  No obvious infectious source- urine clear, no sputum.  - amio PO started per HF.  Optimize electrolytes, K> 4, Mag > 2 - cont to hold GDMT and BB given pressor needs - CVP 12, coox 91, repeat 90 - consider additional lasix today - heparin per pharmacy - cont ASA, add statin - TTM protocol as below   Hypokalemia Hypomag, resolved - trend on daily labs and replete prn   Lactic acidosis due to cardiac arrest, improving - cont hemodynamic support as above   Acute respiratory failure with hypoxia postcardiac arrest  - ABG checked> increase TV to 8cc.  Repeat ABG 1hr post changes - cont full MV support, 8cc/kg IBW with goal Pplat <30 and DP<15  - VAP prevention protocol/ PPI - PAD protocol for sedation> see below - intermittent CXR/ ABG - wean FiO2 as able for SpO2 >92% - daily SAT & SBT> not a candidate given myoclonus suppression/ current sedation needs as below  AKI on CKD 3a - likely due to hypoperfusion from cardiac arrest/ poor perfusion state - good diureses from lasix yest.  Slight increase in sCr.  Cont to closely monitor for ATN - trend renal indices  - strict I/Os, daily wts - avoid nephrotoxins, renal dose meds, hemodynamic support as above   Acute encephalopathy: post arrest; concern for anoxic injury Myoclonic seizures - Neurology following, appreciate input - if cEEG remains neg, will start to minimize sedation with propofol, versed, and fentanyl gtts pending neuro guidance w/ bowel regimen - cont cEEG > myoclonus activity abated after medications adjusted 10/8,  current profound diffuse encephalopathy, eeg pattern suggestive of anoxic/ hypoxic brain injury - seizure precautions - AEDS per neuro> keppra and VPA - possible MRI 10/10 which will be 72hrs however EEG leads are not MRI compatible - cont TTM protocol, avoiding fevers - seizures in post arrest state usually ominous sign indicating significant brain injury; neuro prognostication pending   HTN - cont holding PTA Entresto, spironolactone, and carvedilol while on pressors   Type 2 DM - Holding PTA metformin and Jardiance - SSI prn    Leukocytosis, most likely reactive - see above, likely reactive.   - cont to monitor clinically off abx  At risk for malnutrition - start TF per RD  GOC - ongoing.  Myoclonus remains ominous sign after cardiac arrest suggestive of significant brain/ anoxic injury.  Considering PMT consult.  Ongoing GOC, remains full code for now  Best Practice (right click and "Reselect all SmartList Selections" daily)   Diet/type: NPO w/ meds via tube DVT prophylaxis: systemic heparin GI prophylaxis: PPI Lines: Central line and Arterial Line Foley:  Yes, and it is still needed Code Status:  full code Last date of multidisciplinary goals of care discussion 2023/07/14 updated family at bedside]  Wife updated bedside 10/9  Labs   CBC: Recent Labs  Lab 2023/07/14 2102 14-Jul-2023 2108 07/14/2023 2221 07/09/23 0041 07/09/23 0257 07/10/23 0310  WBC 13.1*  --   --  15.2* 18.2* 13.5*  HGB 13.6 14.3  14.3 14.3 12.3* 12.7* 12.8*  HCT 43.9 42.0  42.0 42.0 38.3* 39.2 39.4  MCV 95.9  --   --  94.8 92.0 94.3  PLT 291  --   --  279 292 285    Basic Metabolic Panel: Recent Labs  Lab 07/14/23 2102 07/14/2023 2108 07/14/2023 2221 07/09/23 0041 07/09/23 0257 07/09/23 0752 07/10/23 0310  NA 138 140  139 139 137 139 139 144  K 3.2* 3.2*  3.2* 3.9 3.7 3.7 3.8 3.4*  CL 103 105  --  105 107 106 110  CO2 17*  --   --  17* 19* 17* 20*  GLUCOSE 320* 322*  --  261* 206*  158* 141*  BUN 15 17  --  19 20 24* 24*  CREATININE 1.39* 1.30*  --  1.43* 1.40* 1.57* 1.88*  CALCIUM 7.7*  --   --  7.8* 8.0* 7.8* 7.9*  MG  --   --   --  1.3* 2.2  --   --   PHOS  --   --   --   --  2.9  --   --    GFR: Estimated Creatinine Clearance: 31.8 mL/min (A) (by C-G formula based on SCr of 1.88 mg/dL (H)). Recent Labs  Lab 14-Jul-2023 2102 07-14-2023 2108 07/09/23 0039 07/09/23 0041 07/09/23 0257 07/10/23 0310  WBC 13.1*  --   --  15.2* 18.2* 13.5*  LATICACIDVEN  --  6.7* 3.8*  --  2.9* 2.5*    Liver Function Tests: Recent Labs  Lab 07/04/23 1146 07/09/23 0039 07/10/23 0310  AST 16 64* 33  ALT 8 67* 44  ALKPHOS 81 79 70  BILITOT 0.6 0.9 0.8  PROT 7.0 6.4* 6.3*  ALBUMIN 3.9 3.4* 3.0*   No results for input(s): "LIPASE", "AMYLASE" in the last 168 hours. No results for input(s): "AMMONIA" in the last 168 hours.  ABG    Component Value Date/Time   PHART 7.273 (L) July 14, 2023 2221   PCO2ART 37.6 14-Jul-2023 2221   PO2ART 479 (H) 2023-07-14 2221   HCO3 18.1 (L) Jul 14, 2023 2221   TCO2 19 (L) Jul 14, 2023 2221   ACIDBASEDEF 9.0 (H) 07-14-2023 2221   O2SAT 91 07/10/2023 0309      Critical care time: 35 mins      Posey Boyer, MSN, AG-ACNP-BC [facility] 07/10/2023, 8:28 AM  See Amion for pager If no response to pager , please call [phone] until 7pm After 7:00 pm call Elink  [phone]

## 2023-07-10 NOTE — Progress Notes (Signed)
Brief Nutrition Note  Consult received for enteral/tube feeding initiation and management.  Adult Enteral Nutrition Protocol initiated. Full assessment to follow.  OG tube in place with tip located at GE junction per xray imaging on 10/08. Noted chest or abd imaging today, unclear if OG was advanced. Spoke with RN who plans to advance OG and obtain repeat abd xray. Ok to initiate TF once correct tube placement confirmed.   Admitting Dx: Cardiac arrest (HCC) [I46.9]  Body mass index is 26.54 kg/m.   Labs:  Recent Labs  Lab 07/09/23 0041 07/09/23 0257 07/09/23 0752 07/10/23 0310 07/10/23 0829 07/10/23 1050  NA 137 139 139 144  --  145  K 3.7 3.7 3.8 3.4*  --  3.5  CL 105 107 106 110  --   --   CO2 17* 19* 17* 20*  --   --   BUN 19 20 24* 24*  --   --   CREATININE 1.43* 1.40* 1.57* 1.88*  --   --   CALCIUM 7.8* 8.0* 7.8* 7.9*  --   --   MG 1.3* 2.2  --   --  2.1  --   PHOS  --  2.9  --   --   --   --   GLUCOSE 261* 206* 158* 141*  --   --     Romelle Starcher MS, RDN, LDN, CNSC Registered Dietitian 3 Clinical Nutrition RD Pager and On-Call Pager Number Located in [place]

## 2023-07-10 NOTE — Progress Notes (Addendum)
Advanced Heart Failure Rounding Note  PCP-Cardiologist: None   Subjective:    Remains intubated and sedated.   Continues sinus brady, HR 50s. 1 4 beat run of NSVT overnight but no sustained ventricular arrhythmias.  K 3.4 Mg pending   Higher pressor requirements today. On NE 13 this morning. Current A-line MAP 56.   Co-ox resulted 91% (repeat pending). Lactic acid 6.7>>3.8>>2.9>>2.5   Spiked temp 100.6 at midnight. WBC 18>>13K w/o treatment.   SCr trending up, 1.40>>1.57>>1.88. Good UOP after dose of IV Lasix, 3.4L out yesterday. CVP ~3    Wife present at bedside.    Objective:   Weight Range: 83.9 kg Body mass index is 26.54 kg/m.   Vital Signs:   Temp:  [94.6 F (34.8 C)-100.6 F (38.1 C)] 98.6 F (37 C) (10/09 0753) Pulse Rate:  [40-63] 60 (10/09 0753) Resp:  [18-22] 22 (10/09 0753) BP: (76-156)/(43-72) 122/54 (10/09 0700) SpO2:  [97 %-100 %] 98 % (10/09 0754) Arterial Line BP: (81-157)/(34-95) 143/45 (10/09 0700) FiO2 (%):  [40 %] 40 % (10/09 0754) Weight:  [83.9 kg] 83.9 kg (10/09 0233) Last BM Date :  (pta)  Weight change: Filed Weights   07/09/23 0100 07/10/23 0233  Weight: 82.8 kg 83.9 kg    Intake/Output:   Intake/Output Summary (Last 24 hours) at 07/10/2023 0759 Last data filed at 07/10/2023 0700 Gross per 24 hour  Intake 2937.85 ml  Output 4365 ml  Net -1427.15 ml      Physical Exam    General:  intubated and sedated  HEENT: Normal + ETT  Neck: Supple. JVP not well visualized . Carotids 2+ bilat; no bruits. No lymphadenopathy or thyromegaly appreciated. Cor: PMI nondisplaced. Regular rhythm, slow rate. No rubs, gallops or murmurs. Lungs: Intubated and clear anteriorly  Abdomen: Soft, nontender, nondistended. No hepatosplenomegaly. No bruits or masses. Good bowel sounds. Extremities: No cyanosis, clubbing, rash, edema Neuro: intubated and sedated GU: + foley, clear urine    Telemetry   Sinus brady, mid 50s. 1 4 beat run of NSVT  overnight, personally reviewed   EKG    No new EKG to review   Labs    CBC Recent Labs    07/09/23 0257 07/10/23 0310  WBC 18.2* 13.5*  HGB 12.7* 12.8*  HCT 39.2 39.4  MCV 92.0 94.3  PLT 292 285   Basic Metabolic Panel Recent Labs    40/98/11 0041 07/09/23 0257 07/09/23 0752 07/10/23 0310  NA 137 139 139 144  K 3.7 3.7 3.8 3.4*  CL 105 107 106 110  CO2 17* 19* 17* 20*  GLUCOSE 261* 206* 158* 141*  BUN 19 20 24* 24*  CREATININE 1.43* 1.40* 1.57* 1.88*  CALCIUM 7.8* 8.0* 7.8* 7.9*  MG 1.3* 2.2  --   --   PHOS  --  2.9  --   --    Liver Function Tests Recent Labs    07/09/23 0039 07/10/23 0310  AST 64* 33  ALT 67* 44  ALKPHOS 79 70  BILITOT 0.9 0.8  PROT 6.4* 6.3*  ALBUMIN 3.4* 3.0*   No results for input(s): "LIPASE", "AMYLASE" in the last 72 hours. Cardiac Enzymes No results for input(s): "CKTOTAL", "CKMB", "CKMBINDEX", "TROPONINI" in the last 72 hours.  BNP: BNP (last 3 results) Recent Labs    04/30/23 1246 07/04/23 1146 07/09/23 0039  BNP 217.7* 307.4* 626.8*    ProBNP (last 3 results) No results for input(s): "PROBNP" in the last 8760 hours.   D-Dimer No  results for input(s): "DDIMER" in the last 72 hours. Hemoglobin A1C Recent Labs    07/09/23 0039  HGBA1C 7.0*   Fasting Lipid Panel Recent Labs    07/10/23 0310  TRIG 123   Thyroid Function Tests No results for input(s): "TSH", "T4TOTAL", "T3FREE", "THYROIDAB" in the last 72 hours.  Invalid input(s): "FREET3"  Other results:   Imaging    ECHOCARDIOGRAM COMPLETE  Result Date: 07/09/2023    ECHOCARDIOGRAM REPORT   Patient Name:   ALCEE PEBBLES Date of Exam: 07/09/2023 Medical Rec #:  [id number]        Height:       70.0 in Accession #:    [id number]       Weight:       182.5 lb Date of Birth:  Feb 23, 1941        BSA:          2.008 m Patient Age:    82 years         BP:           95/55 mmHg Patient Gender: M                HR:           54 bpm. Exam Location:  Inpatient  Procedure: 2D Echo, Cardiac Doppler, Color Doppler and Intracardiac            Opacification Agent Indications:    R00.9* Unspecified abnormalities of heart beat. Cardiac arrest.  History:        Patient has prior history of Echocardiogram examinations, most                 recent 04/30/2023. CAD, Aortic Valve Disease, Arrythmias:Cardiac                 Arrest, Signs/Symptoms:Shortness of Breath and Dyspnea; Risk                 Factors:Hypertension and Diabetes.  Sonographer:    Sheralyn Boatman RDCS Referring Phys: [id number] Lidia Collum  Sonographer Comments: Echo performed with patient supine and on artificial respirator. IMPRESSIONS  1. Left ventricular ejection fraction, by estimation, is 25 to 30%. The left ventricle has severely decreased function. The left ventricle demonstrates global hypokinesis. The left ventricular internal cavity size was moderately dilated. Left ventricular diastolic parameters are consistent with Grade I diastolic dysfunction (impaired relaxation).  2. Right ventricular systolic function is low normal. The right ventricular size is moderately enlarged.  3. Left atrial size was mildly dilated.  4. Right atrial size was mildly dilated.  5. The mitral valve is degenerative. Trivial mitral valve regurgitation. No evidence of mitral stenosis.  6. The aortic valve has been repaired/replaced. Aortic valve regurgitation is not visualized. No aortic stenosis is present. Procedure Date: 02/09/2020. Aortic valve mean gradient measures 5.0 mmHg. Aortic valve Vmax measures 1.54 m/s. Conclusion(s)/Recommendation(s): No left ventricular mural or apical thrombus/thrombi. FINDINGS  Left Ventricle: Left ventricular ejection fraction, by estimation, is 25 to 30%. The left ventricle has severely decreased function. The left ventricle demonstrates global hypokinesis. Definity contrast agent was given IV to delineate the left ventricular endocardial borders. The left ventricular internal cavity size was moderately  dilated. There is no left ventricular hypertrophy. Left ventricular diastolic parameters are consistent with Grade I diastolic dysfunction (impaired relaxation). Right Ventricle: The right ventricular size is moderately enlarged. No increase in right ventricular wall thickness. Right ventricular systolic function is low normal. Left Atrium: Left atrial size was  mildly dilated. Right Atrium: Right atrial size was mildly dilated. Pericardium: There is no evidence of pericardial effusion. Mitral Valve: The mitral valve is degenerative in appearance. Trivial mitral valve regurgitation. No evidence of mitral valve stenosis. Tricuspid Valve: The tricuspid valve is normal in structure. Tricuspid valve regurgitation is not demonstrated. No evidence of tricuspid stenosis. Aortic Valve: The aortic valve has been repaired/replaced. Aortic valve regurgitation is not visualized. No aortic stenosis is present. Aortic valve mean gradient measures 5.0 mmHg. Aortic valve peak gradient measures 9.4 mmHg. Aortic valve area, by VTI measures 3.08 cm. There is a 29 mm Medtronic stented (TAVR) valve present in the aortic position. Pulmonic Valve: The pulmonic valve was normal in structure. Pulmonic valve regurgitation is not visualized. No evidence of pulmonic stenosis. Aorta: The aortic root is normal in size and structure. IAS/Shunts: No atrial level shunt detected by color flow Doppler.  LEFT VENTRICLE PLAX 2D LVIDd:         6.00 cm      Diastology LVIDs:         5.40 cm      LV e' medial:    3.42 cm/s LV PW:         1.00 cm      LV E/e' medial:  23.8 LV IVS:        1.00 cm      LV e' lateral:   3.07 cm/s LVOT diam:     2.20 cm      LV E/e' lateral: 26.5 LV SV:         106 LV SV Index:   53 LVOT Area:     3.80 cm  LV Volumes (MOD) LV vol d, MOD A2C: 117.2 ml LV vol d, MOD A4C: 128.0 ml LV vol s, MOD A2C: 79.0 ml LV vol s, MOD A4C: 96.8 ml LV SV MOD A2C:     38.3 ml LV SV MOD A4C:     128.0 ml LV SV MOD BP:      36.4 ml RIGHT  VENTRICLE         IVC TAPSE (M-mode): 1.8 cm  IVC diam: 2.10 cm LEFT ATRIUM           Index        RIGHT ATRIUM           Index LA diam:      2.50 cm 1.25 cm/m   RA Area:     12.70 cm LA Vol (A2C): 34.5 ml 17.18 ml/m  RA Volume:   33.30 ml  16.58 ml/m LA Vol (A4C): 11.3 ml 5.63 ml/m  AORTIC VALVE AV Area (Vmax):    3.24 cm AV Area (Vmean):   3.20 cm AV Area (VTI):     3.08 cm AV Vmax:           153.50 cm/s AV Vmean:          98.950 cm/s AV VTI:            0.344 m AV Peak Grad:      9.4 mmHg AV Mean Grad:      5.0 mmHg LVOT Vmax:         131.00 cm/s LVOT Vmean:        83.400 cm/s LVOT VTI:          0.279 m LVOT/AV VTI ratio: 0.81  AORTA Ao Root diam: 3.00 cm Ao Asc diam:  3.20 cm MITRAL VALVE MV Area (PHT): 2.99 cm  SHUNTS MV Decel Time: 254 msec     Systemic VTI:  0.28 m MV E velocity: 81.50 cm/s   Systemic Diam: 2.20 cm MV A velocity: 112.00 cm/s MV E/A ratio:  0.73 Kardie Tobb DO Electronically signed by Thomasene Ripple DO Signature Date/Time: 07/09/2023/5:13:58 PM    Final    [place] EKG SITE RITE  Result Date: 07/09/2023 If Site Rite image not attached, placement could not be confirmed due to current cardiac rhythm.    Medications:     Scheduled Medications:  aspirin  81 mg Per Tube Daily   Chlorhexidine Gluconate Cloth  6 each Topical Daily   docusate  100 mg Per Tube BID   insulin aspart  0-15 Units Subcutaneous Q4H   mouth rinse  15 mL Mouth Rinse Q2H   pantoprazole (PROTONIX) IV  40 mg Intravenous Q24H   polyethylene glycol  17 g Per Tube Daily   potassium chloride  20 mEq Per Tube Once   sodium chloride flush  10-40 mL Intracatheter Q12H    Infusions:  fentaNYL infusion INTRAVENOUS 125 mcg/hr (07/10/23 0700)   heparin 1,000 Units/hr (07/10/23 0700)   levETIRAcetam Stopped (07/09/23 1646)   midazolam 20 mg/hr (07/10/23 0700)   norepinephrine (LEVOPHED) Adult infusion 15 mcg/min (07/10/23 0531)   propofol (DIPRIVAN) infusion 40 mcg/kg/min (07/10/23 0530)   valproate sodium  Stopped (07/10/23 0526)    PRN Medications: acetaminophen, docusate, fentaNYL (SUBLIMAZE) injection, fentaNYL (SUBLIMAZE) injection, midazolam, midazolam, mouth rinse, polyethylene glycol, sodium chloride flush    Patient Profile   82 y.o. male with history of CAD s/p LAD PCI in 2021 (nonobstructive dz on repeat LHC in 2023), aortic stenosis s/p TAVR, chronic systolic HF last known EF 35-40% and chronic LBBB admitted for out of hospital VT arrest.   Assessment/Plan   1. VT arrest: has baseline cardiomyopathy, last echo with EF 35-40%.  HS-TnI elevated 562 => 1221.  Mg was low at 1.3.  Suspect VT arrest due to cardiomyopathy rather than ischemia (doubt ACS).   - Currently sinus brady in 50s.  If HR stabilizes in future, will add amiodarone po.  - If he has neurologic recovery, would favor CRT-D device.   - K 3.4 today, treat w/ aggressive K sup. Check Mg level  2. Chronic systolic CHF: Echo in 5/23 with EF 30-35%, normally-functioning bioprosthetic AoV and normal RV.  Most recent prior echo from 5/22 showed EF 50-55%.  Fall in EF was associated with CHF exacerbation and atypical chest pain.  He also has LBBB that is new since 3/22.  RHC/LHC was done in 6/23; filling pressures were optimized and CO was normal, moderate disease in OM2 did not explain fall in EF.  Repeat Echo 12/23 EF 30% with global hypokinesis, normal RV, normal bioprosthetic aortic valve with mean gradient 10 and no AI (s/p TAVR), IVC normal.  Echo in 7/24 showed EF mildly better at 35-40% with septal-lateral dyssynchrony, normal RV, IVC normal, s/p TAVR with mean gradient 13 mmHg and no paravalvular leakage. Nonischemic cardiomyopathy, ?LBBB cardiomyopathy. NYHA class II and stable symptoms prior to this event.  Was awaiting cardiac MRI to help make decision on CRT-D or CRT-P device. Echo post arrest, EF 25-30%, RV mildly reduced. No RWMA to suggest ischemia. Currently on NE 13. Co-ox 91% (repeat pending). MAP 56. Titrate NE up to  keep MAP > 65. ? Developing septic/ vasopalegic shock component.  - Hold GDMT for now with hypotension.  - Volume status ok on exam. CVP ~3  3.  CAD: PCI to proximal LAD pre-TAVR in 4/21, not associated with ACS.  Cath in 6/23 showed 70% OM2 stenosis, medically managed. No chest pain at home.  VT arrest as above, HS-TnI elevated but suspect this may be demand ischemia related to arrest.  Had been having no chest pain prior.  Doubt ACS.  - Continue ASA 81 mg  - Continue atorvastatin 40 daily.  - If he has neurologic recovery, will need coronary evaluation.   4. 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  Baseline Scr ~1.3. Creatinine 1.57 on admit, now up to 1.9 today.  MAPs low (mid 50s)  - Titrate NE to maintain MAP > 65 - follow BMP  5. Aortic stenosis: S/p TAVR with Medtronic Evolut valve, normal valve function on 7/24 echo.   - stable on echo this admit  6. 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  Neurology is following, he is undergoing EEG now.  - Will need to follow over the next couple days for neurologic recovery.  7. Hypomagnesemia/Hypokalemia: Initially replaced. K back down again at 3.4. Will give K supp. Mg level pending  8. Acute hypoxemic respiratory failure: In setting of arrest.  - vent management per PCCM    Length of Stay: 2  Robbie Lis, PA-C  07/10/2023, 7:59 AM  Advanced Heart Failure Team Pager [phone] (M-F; 7a - 5p)  Please contact [facility] for night-coverage after hours (5p -7a ) and weekends on [email or web address]  Patient seen with PA, agree with the above note.   Patient remains sedated/intubated.  Fever to 100.6 last night, BP lower today with wide pulse pressure.  Co-ox not accurate, lactate trending down but still elevated 2.5. WBCs 13.5.   HR higher today, NSR in 60s.   CVP 5, creatinine higher 1.88.    He is on NE 16.   Echo showed EF 25-30%, mild RV dysfunction, stable TAVR valve without AI and mean gradient 5.    General: Sedated on vent Neck: No JVD, no thyromegaly or thyroid nodule.  Lungs: Decreased at bases.  CV: Nondisplaced PMI.  Heart regular S1/S2, no S3/S4, no murmur.  1+ ankle edema.  Abdomen: Soft, nontender, no hepatosplenomegaly, no distention.  Skin: Intact without lesions or rashes.  Neurologic: Sedated on vent Extremities: No clubbing or cyanosis.  HEENT: Normal.   Shock, suspect cardiogenic but ?component of septic/distributive physiology with lower MAP today, wide pulse pressure, low grade fever.  - Continue NE 16 - Resend co-ox (inaccurate).  - Add vasopressin today to maintain MAP 65 and above.  - Would cover empirically with vancomycin/cefepime, ?aspiration PNA.  - Send procalcitonin.  - CVP 5, no Lasix.   HR higher today in NSR, will add amiodarone 200 mg bid with h/o VT arrest.   Worried for anoxic encephalopathy.  Neurology following, has continuous EEG currently, eventual MRI head needed.   CRITICAL CARE Performed by: Marca Ancona  Total critical care time: 40 minutes  Critical care time was exclusive of separately billable procedures and treating other patients.  Critical care was necessary to treat or prevent imminent or life-threatening deterioration.  Critical care was time spent personally by me on the following activities: development of treatment plan with patient and/or surrogate as well as nursing, discussions with consultants, evaluation of patient's response to treatment, examination of patient, obtaining history from patient or surrogate, ordering and performing treatments and interventions, ordering and review of laboratory studies, ordering and review of radiographic studies, pulse oximetry and re-evaluation  of patient's condition.  Marca Ancona 07/10/2023 11:33 AM

## 2023-07-10 NOTE — Plan of Care (Signed)
  Problem: Skin Integrity: Goal: Risk for impaired skin integrity will be minimized. Outcome: Progressing

## 2023-07-10 NOTE — Procedures (Signed)
Patient Name: Andre Jimenez  MRN: [id number]  Epilepsy Attending: Charlsie Quest  Referring Physician/Provider: Oretha Milch, MD  Duration: 07/09/2023 4098 to 07/10/2023 1191   Patient history: 82yo M s/p cardiac arrest getting eeg to evaluate for seizure   Level of alertness: comatose   AEDs during EEG study: LEV, VPA, propofol, versed   Technical aspects: This EEG study was done with scalp electrodes positioned according to the 10-20 International system of electrode placement. Electrical activity was reviewed with band pass filter of 1-70Hz , sensitivity of 7 uV/mm, display speed of 17mm/sec with a 60Hz  notched filter applied as appropriate. EEG data were recorded continuously and digitally stored.  Video monitoring was available and reviewed as appropriate.   Description: At the beginning of the study, patient was noted to have episodes of brief sudden whole body jerking every 1-6 seconds. Concomitant EEG showed generalized polyspikes consistent with myoclonic seizures. In between seizures EEG showed generalized background suppression. As medications were adjusted, after around 1030 on 07/09/2023, EEG showed generalized background suppression.   Hyperventilation and photic stimulation were not performed.      ABNORMALITY - Myoclonic seizure, generalized - Background suppression, generalized   IMPRESSION: At the beginning of the study, patient was noted to have myoclonic seizures every 1-6 seconds. As medications were adjusted, after around 1030 on 07/09/2023, seizures abated. Additionally there was evidence of profound diffuse encephalopathy.  In the setting of cardiac arrest, this EEG pattern is suggestive of anoxic/hypoxic brain injury.    Khiana Camino Annabelle Harman

## 2023-07-10 NOTE — Progress Notes (Signed)
Abnormal blood gas 7.263 CO2 50.7 reported to Posey Boyer, NP at 10:50 am.  No changes made at this time.

## 2023-07-11 DIAGNOSIS — I469 Cardiac arrest, cause unspecified: Secondary | ICD-10-CM | POA: Diagnosis not present

## 2023-07-11 DIAGNOSIS — R569 Unspecified convulsions: Secondary | ICD-10-CM | POA: Diagnosis not present

## 2023-07-11 DIAGNOSIS — I5022 Chronic systolic (congestive) heart failure: Secondary | ICD-10-CM | POA: Diagnosis not present

## 2023-07-11 LAB — BASIC METABOLIC PANEL
Anion gap: 13 (ref 5–15)
BUN: 21 mg/dL (ref 8–23)
CO2: 22 mmol/L (ref 22–32)
Calcium: 8.2 mg/dL — ABNORMAL LOW (ref 8.9–10.3)
Chloride: 108 mmol/L (ref 98–111)
Creatinine, Ser: 1.65 mg/dL — ABNORMAL HIGH (ref 0.61–1.24)
GFR, Estimated: 41 mL/min — ABNORMAL LOW (ref >60)
Glucose, Bld: 147 mg/dL — ABNORMAL HIGH (ref 70–99)
Potassium: 4 mmol/L (ref 3.5–5.1)
Sodium: 143 mmol/L (ref 135–145)

## 2023-07-11 LAB — URINALYSIS, ROUTINE W REFLEX MICROSCOPIC
Bacteria, UA: NONE SEEN
Bilirubin Urine: NEGATIVE
Glucose, UA: >=500 mg/dL — AB
Ketones, ur: 5 mg/dL — AB
Leukocytes,Ua: NEGATIVE
Nitrite: NEGATIVE
Protein, ur: NEGATIVE mg/dL
Specific Gravity, Urine: 1.02 (ref 1.005–1.030)
pH: 5 (ref 5.0–8.0)

## 2023-07-11 LAB — PHOSPHORUS
Phosphorus: 4.8 mg/dL — ABNORMAL HIGH (ref 2.5–4.6)
Phosphorus: 3.4 mg/dL (ref 2.5–4.6)

## 2023-07-11 LAB — POCT I-STAT 7, (LYTES, BLD GAS, ICA,H+H)
Acid-base deficit: 4 mmol/L — ABNORMAL HIGH (ref 0.0–2.0)
Bicarbonate: 21.7 mmol/L (ref 20.0–28.0)
Calcium, Ion: 1.13 mmol/L — ABNORMAL LOW (ref 1.15–1.40)
HCT: 33 % — ABNORMAL LOW (ref 39.0–52.0)
Hemoglobin: 11.2 g/dL — ABNORMAL LOW (ref 13.0–17.0)
O2 Saturation: 99 %
Patient temperature: 36.3
Potassium: 4.2 mmol/L (ref 3.5–5.1)
Sodium: 141 mmol/L (ref 135–145)
TCO2: 23 mmol/L (ref 22–32)
pCO2 arterial: 42 mm[Hg] (ref 32–48)
pH, Arterial: 7.317 — ABNORMAL LOW (ref 7.35–7.45)
pO2, Arterial: 170 mm[Hg] — ABNORMAL HIGH (ref 83–108)

## 2023-07-11 LAB — CBC
HCT: 36.5 % — ABNORMAL LOW (ref 39.0–52.0)
Hemoglobin: 11.5 g/dL — ABNORMAL LOW (ref 13.0–17.0)
MCH: 30.4 pg (ref 26.0–34.0)
MCHC: 31.5 g/dL (ref 30.0–36.0)
MCV: 96.6 fL (ref 80.0–100.0)
Platelets: 215 10*3/uL (ref 150–400)
RBC: 3.78 MIL/uL — ABNORMAL LOW (ref 4.22–5.81)
RDW: 13.4 % (ref 11.5–15.5)
WBC: 9 10*3/uL (ref 4.0–10.5)
nRBC: 0 % (ref 0.0–0.2)

## 2023-07-11 LAB — GLUCOSE, CAPILLARY
Glucose-Capillary: 123 mg/dL — ABNORMAL HIGH (ref 70–99)
Glucose-Capillary: 130 mg/dL — ABNORMAL HIGH (ref 70–99)
Glucose-Capillary: 140 mg/dL — ABNORMAL HIGH (ref 70–99)
Glucose-Capillary: 140 mg/dL — ABNORMAL HIGH (ref 70–99)
Glucose-Capillary: 143 mg/dL — ABNORMAL HIGH (ref 70–99)
Glucose-Capillary: 161 mg/dL — ABNORMAL HIGH (ref 70–99)

## 2023-07-11 LAB — MAGNESIUM
Magnesium: 3.3 mg/dL — ABNORMAL HIGH (ref 1.7–2.4)
Magnesium: 2.7 mg/dL — ABNORMAL HIGH (ref 1.7–2.4)

## 2023-07-11 LAB — COOXEMETRY PANEL
Carboxyhemoglobin: 1.8 % — ABNORMAL HIGH (ref 0.5–1.5)
Methemoglobin: <0.7 % (ref 0.0–1.5)
O2 Saturation: 90.8 %
Total hemoglobin: 11.3 g/dL — ABNORMAL LOW (ref 12.0–16.0)

## 2023-07-11 MED ORDER — MAGNESIUM SULFATE 4 GM/100ML IV SOLN
4.0000 g | Freq: Once | INTRAVENOUS | Status: AC
  Administered 2023-07-11: 4 g via INTRAVENOUS
  Filled 2023-07-11: qty 100

## 2023-07-11 MED ORDER — FENTANYL CITRATE PF 50 MCG/ML IJ SOSY
25.0000 ug | PREFILLED_SYRINGE | INTRAMUSCULAR | Status: DC | PRN
  Administered 2023-07-12: 50 ug via INTRAVENOUS
  Filled 2023-07-11 (×4): qty 1

## 2023-07-11 MED ORDER — HEPARIN SODIUM (PORCINE) 5000 UNIT/ML IJ SOLN
5000.0000 [IU] | Freq: Three times a day (TID) | INTRAMUSCULAR | Status: DC
  Administered 2023-07-11 – 2023-07-13 (×7): 5000 [IU] via SUBCUTANEOUS
  Filled 2023-07-11 (×7): qty 1

## 2023-07-11 NOTE — Progress Notes (Signed)
eLink Physician-Brief Progress Note Patient Name: CALYB MCQUARRIE DOB: 1941-01-29 MRN: [id number]   Date of Service  07/11/2023  HPI/Events of Note  Mg 1.6  eICU Interventions  Magnesium sulfate ordered     Intervention Category Minor Interventions: Electrolytes abnormality - evaluation and management  Mariusz Jubb 07/11/2023, 12:50 AM

## 2023-07-11 NOTE — Progress Notes (Signed)
NAME:  Andre Jimenez, MRN:  [id number], DOB:  November 24, 1940, LOS: 3 ADMISSION DATE:  2023-07-14, CONSULTATION DATE:  2023-07-14 REFERRING MD:  Dr. Ledon Snare, CHIEF COMPLAINT:  VT arrest   History of Present Illness:  Patient is a 82 yo M w/ pertinent PMH systolic chf, Aortic stenosis s/p TAVR 01/2020, LBBB, CAD, htn, t2dm, ckd stage 3a presents to [facility] ED on 2023-07-14 for VT arrest.  On 2023/07/14 patient doing well during the day. Wife found him unresponsive in yard and called EMS and started CPR for about 12 minutes by wife until ems arrived. Initial rhythm vtach and was shocked. Patient had multiple round of cpr with intermittent ROSC about 4 times. Patient was shocked 2 times during cpr and intubated. Estimated total ROSC about 20 minutes. Unresponsive post rosc and transported to [facility] ED.   On arrival BP stable. EKG w/ LBBB and no ST elevation which is similar to previous EKG. Bedside echo w/ EF about 30% and no evidence of R heart strain. Patient intubated and sats stable on vent. CXR no significant findings. Remains unresponsive. CT head no acute abnormality. CT chest/abd/pelvis no acute findings. VBG ph 7.19 and co2 43. LA 6.7. K 3.2. Glucose 320. Trop 562. Cards consulted. PCCM consulted for icu admission.   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 Now requiring vasopressors.  10/9 weaning sedation on EEG, NE requirements up, vaso added, abx- vanc/ cefepime  Interim History / Subjective:  Coox 90.8 Remains on NE 6 and vasopressin  Remains on cEEG on propofol 20 and fentanyl 75 mcg Frequent ectopy, one brief NSVT overnight with mag 1.6 since repeated and now 3.3  Objective   Blood pressure (!) 119/53, pulse (!) 46, temperature (!) 97.5 F (36.4 C), resp. rate (!) 22, height 5\' 10"  (1.778 m), weight 81.8 kg, SpO2 99%. CVP:  [2 mmHg-12 mmHg] 10 mmHg  Vent Mode: PRVC FiO2 (%):  [40 %] 40 % Set Rate:  [22 bmp] 22 bmp Vt Set:  [440 mL] 440 mL PEEP:  [5 cmH20] 5 cmH20 Plateau Pressure:  [14 cmH20-15 cmH20] 15 cmH20   Intake/Output Summary (Last 24 hours) at 07/11/2023 0709 Last data filed at 07/11/2023 0631 Gross per 24 hour  Intake 3431.28 ml  Output 2300 ml  Net 1131.28 ml   Filed Weights   07/09/23 0100 07/10/23 0233 07/11/23 0500  Weight: 82.8 kg 83.9 kg 81.8 kg    Examination: Fent 125> 50> off, propofol 20 General:  critically ill elderly male in NAD on MV HEENT: MM pink/moist, ETT/ OGT, pupils 3/minimally reactive, right slightly irregular, absent corneal's,  mild scleral edema, eeg leads in place Neuro: unresponsive to noxious stimuli CV: rr, SB, PICC RUE, L rad Aline PULM:  non labored, not breathing over set rate, clear, no sputum, no gag or cough GI: ?slightly distended but remains soft, no BS noted today, foley- cyu Extremities: warm/dry, no LE edema  Skin: no rashes   Labs > k 4, bicarb 22, sCr 1.57> 1.88> 1.65, Mag 3.3, phos 4.8, WBC 18.2> 13.5> 9, PCT 0.33 Coox 91%, CVP 7  UOP 1.6L/ 24hrs after diureses yest -1.1L/ 24hrs Net -154 ml Wt 83.9> 81.8  10/8 Echo-LVEF 25 to 30%, global hypokinesis.  Moderately enlarged RV, low normal function.   Dilated atrium.  Trivial MR.  No mitral stenosis.  Replaced aortic valve, no regurgitation or stenosis. No RWA  Resolved Hospital Problem list   Lactic acidosis  Assessment & Plan:  VT arrest - suspect this is related to his chronic cardiomyopathy and Mag 1.3 on admit.  No reports of acute exacerbation of symptoms or worsening abdominal distention per family. Chronic HFrEF, cardiomyopathy EF 25-30% (previously 35-40%) Aortic stenosis s/p TAVR 01/2020 Hx of LBBB CAD - tele monitoring - appreciate HF assistance - cont NE and vaso for goal MAP > 65.    - coox remains reassuring - trending CVP and coox - remains bradycardic overnight in mostly 40-50's, defer continuing PO amio to HF. Suspect increased ectopy overnight related to low mag -  cont to optimize electrolytes, K> 4, Mag > 2 - cont to hold GDMT given ongoing vasopressor requirement - heparin stopped per HF - cont ASA, statin - TTM protocol as below   Hypokalemia Hypomag, resolved - checking Mag q 12, goal > 2, K > 4   Acute respiratory failure with hypoxia postcardiac arrest  - rechecked ABG this am- cont PRVC w/ rate 22, 8cc/kg IBW - goal Pplat <30 and DP<15  - VAP prevention protocol/ PPI - PAD protocol for sedation> see below - intermittent CXR/ ABG - goal SpO2 >92% - daily SAT & SBT> not initiating breaths and not a candidate for extubation given mental status   AKI on CKD 3a - likely due to hypoperfusion from cardiac arrest/ poor perfusion state - sCr and UOP stable - cont foley - pending UA - trend renal indices  - strict I/Os, daily wts - avoid nephrotoxins, renal dose meds, hemodynamic support as above   Acute encephalopathy: post arrest; concern for anoxic injury Myoclonic seizures - Neurology following, appreciate input - remains on cEEG.  No clinical seizures noted - AEDS per neuro> keppra and VPA - remains on propofol per neuro.  Off versed gtt, fentanyl stopped this am.  Further weaning per  Neuro based on cEEG - when able to stop cEEG (not MRI compatible)> will need MRI brain - seizure precautions - AEDS per neuro> keppra and VPA - cont to avoid fever - neuro exam remains very poor, absent reflexes, and myoclonus all indicate a severe brain injury and poor prognostic sign, ongoing GOC with family    HTN - cont holding PTA Entresto, spironolactone, and carvedilol while on pressors   Type 2 DM - Holding PTA metformin and Jardiance - SSI prn    Leukocytosis, most likely reactive vs sepsis of unclear source - placed empirically on abx 10/9.  PCT 0.33.  WBC decreasing, remains afebrile.  No secretions and minimal FiO2 settings given high risk of aspiration PNA w/ arrest.  UA still pending> will send today - likely ok to stop vanc with  neg MRSA, cont cefepime - trend WBC/ fever curve.  Recheck PCT in am    At risk for malnutrition - TF per RD  GOC - Wife updated at bedside 10/10am.  Aware of ongoing poor neuro exam and clinical concern for severe brain injury.  Verbalized acknowledgement that he was without oxygen for some time, however is not ready to make any changes to code status until further discussions with their sons.    Best Practice (right click and "Reselect all SmartList Selections" daily)   Diet/type: tubefeeds and NPO w/ meds via tube DVT prophylaxis: systemic heparin GI prophylaxis: PPI Lines: Central line and Arterial Line Foley:  Yes, and it is still needed Code Status:  full code Last date of multidisciplinary goals of care discussion 07/24/2023 updated family at bedside]  Wife updated bedside 10/9  Labs   CBC: Recent Labs  Lab 24-Jul-2023 2102 2023/07/24 2108 07/09/23 0041 07/09/23 0257 07/10/23 0310 07/10/23 1050 07/11/23 0403  WBC 13.1*  --  15.2* 18.2* 13.5*  --  9.0  HGB 13.6   < > 12.3* 12.7* 12.8* 12.6* 11.5*  HCT 43.9   < > 38.3* 39.2 39.4 37.0* 36.5*  MCV 95.9  --  94.8 92.0 94.3  --  96.6  PLT 291  --  279 292 285  --  215   < > =  values in this interval not displayed.    Basic Metabolic Panel: Recent Labs  Lab 07/09/23 0041 07/09/23 0257 07/09/23 0752 07/10/23 0310 07/10/23 0829 07/10/23 1050 07/10/23 1734 07/11/23 0403  NA 137 139 139 144  --  145  --  143  K 3.7 3.7 3.8 3.4*  --  3.5  --  4.0  CL 105 107 106 110  --   --   --  108  CO2 17* 19* 17* 20*  --   --   --  22  GLUCOSE 261* 206* 158* 141*  --   --   --  147*  BUN 19 20 24* 24*  --   --   --  21  CREATININE 1.43* 1.40* 1.57* 1.88*  --   --   --  1.65*  CALCIUM 7.8* 8.0* 7.8* 7.9*  --   --   --  8.2*  MG 1.3* 2.2  --   --  2.1  --  1.6* 3.3*  PHOS  --  2.9  --   --   --   --  3.0 4.8*   GFR: Estimated Creatinine Clearance: 36.3 mL/min (A) (by C-G formula based on SCr of 1.65 mg/dL (H)). Recent Labs  Lab 2023-07-24 2108 07/09/23 0039 07/09/23 0041 07/09/23 0257 07/10/23 0310 07/10/23 1734 07/11/23 0403  PROCALCITON  --   --   --   --   --  0.33  --   WBC  --   --  15.2* 18.2* 13.5*  --  9.0  LATICACIDVEN 6.7* 3.8*  --  2.9* 2.5*  --   --     Liver Function Tests: Recent Labs  Lab 07/04/23 1146 07/09/23 0039 07/10/23 0310  AST 16 64* 33  ALT 8 67* 44  ALKPHOS 81 79 70  BILITOT 0.6 0.9 0.8  PROT 7.0 6.4* 6.3*  ALBUMIN 3.9 3.4* 3.0*   No results for input(s): "LIPASE", "AMYLASE" in the last 168 hours. No results for input(s): "AMMONIA" in the last 168 hours.  ABG    Component Value Date/Time   PHART 7.263 (L) 07/10/2023 1050  PCO2ART 50.7 (H) 07/10/2023 1050   PO2ART 151 (H) 07/10/2023 1050   HCO3 22.9 07/10/2023 1050   TCO2 24 07/10/2023 1050   ACIDBASEDEF 4.0 (H) 07/10/2023 1050   O2SAT 90.8 07/11/2023 0403      Critical care time: 40 mins      Posey Boyer, MSN, AG-ACNP-BC [facility] 07/11/2023, 7:09 AM  See Amion for pager If no response to pager , please call [phone] until 7pm After 7:00 pm call Elink  [phone]

## 2023-07-11 NOTE — Procedures (Signed)
Patient Name: Andre Jimenez  MRN: [id number]  Epilepsy Attending: Charlsie Quest  Referring Physician/Provider: Oretha Milch, MD  Duration: 07/10/2023 9562 to 07/11/2023 1308   Patient history: 82yo M s/p cardiac arrest getting eeg to evaluate for seizure   Level of alertness: comatose   AEDs during EEG study: LEV, VPA, propofol   Technical aspects: This EEG study was done with scalp electrodes positioned according to the 10-20 International system of electrode placement. Electrical activity was reviewed with band pass filter of 1-70Hz , sensitivity of 7 uV/mm, display speed of 13mm/sec with a 60Hz  notched filter applied as appropriate. EEG data were recorded continuously and digitally stored.  Video monitoring was available and reviewed as appropriate.   Description: At the beginning of the study, EEG showed generalized background suppression. Gradually as sedation was weaned, EEG showed burst suppression with asynchronous bursts of 3-5hz  theta-delta slowing lasting 0.5-1 second admixed with 2-5 seconds of generalized suppression.  Subsequently, EEG evolved and showed near continuous generalized sharply contoured 3-5Hz  theta-delta slowing admixed with 1-2 seconds of generalized  suppression. Hyperventilation and photic stimulation were not performed.      ABNORMALITY - Burst suppression, generalized   IMPRESSION: This study was initially suggestive of profound diffuse encephalopathy. Gradually as sedation was weaned, EEG improved and was suggestive of severe diffuse encephalopathy. No seizures were noted.    [name]

## 2023-07-11 NOTE — Progress Notes (Signed)
   07/11/23 1138  Spiritual Encounters  Type of Visit Initial  Care provided to: Pt and family  Conversation partners present during encounter Nurse;Physician  Referral source Nurse (RN/NT/LPN)  Reason for visit Routine spiritual support  OnCall Visit No   Chaplain responded to RN call for spiritual assistance in the room.  Pt is undergoing neurological tests and the result are pointing toward a poor prognosis.  Pt's wife of 59 years at bedside and understandably upset. Chaplain worked to establish a relationship of comfort and support through compassionate presence, and empathetic/reflective listening.  Chaplain encouraged life review and story telling from Pt's wife, son Gwynneth Munson), and grandson. Chaplain explored spiritual resources of Pt and family and determined that [place] prayed would be an appropriate and meaningful intervention for them.  Chaplain offered prayer before leaving the family to comfort one another privately.  Chaplain services remain available by Spiritual Consult or for emergent cases, paging [phone]  Chaplain Raelene Bott, MDiv [email or web address] [phone]

## 2023-07-11 NOTE — Progress Notes (Signed)
Neurology Progress Note  Brief HPI: 82 year old patient with history of CAD status post stenting, aortic stenosis, TAVR, left bundle branch block, hypertension, hyperlipidemia, diabetes, CKD stage IIIa, hearing loss and C-spine fusion who presented yesterday after being found unresponsive and pulseless.  CPR was performed first by family and then EMS, and patient was found to be in V. tach.  ROSC was achieved, and patient had a total downtime of about 20 minutes.  He developed myoclonic jerking after intubation, and EEG demonstrates frequent myoclonic seizures.  He was started on Keppra and Versed, and Versed was increased this morning and propofol was added as well as Depakote given continued myoclonic seizures.  Subjective: Patient is seen in his room with wife and critical care team at the bedside.   Propofol and fentanyl turned off.  NSVT overnight and now on amio  Exam: Vitals:   07/11/23 0800 07/11/23 0820  BP: (!) 123/58   Pulse: (!) 51 (!) 50  Resp: (!) 22 (!) 22  Temp: 98.1 F (36.7 C) 98.1 F (36.7 C)  SpO2: 100% 99%   Gen: In bed, NAD Resp: non-labored breathing, no acute distress Abd: soft, nt  Neuro (intubated, sedation off at 0920): Patient is unresponsive to voice or noxious stimuli.  He is unable to follow commands.  Pupils are sluggish briefly reactive, right pupil is to drop shaped and irregular.  Oculocephalic reflex and corneal reflexes are absent, cough is weak, gag reflex is absent.  He does not respond to noxious stimuli in any extremity.  Pertinent Labs:    Latest Ref Rng & Units 07/11/2023    7:33 AM 07/11/2023    4:03 AM 07/10/2023   10:50 AM  CBC  WBC 4.0 - 10.5 K/uL  9.0    Hemoglobin 13.0 - 17.0 g/dL 41.6  60.6  30.1   Hematocrit 39.0 - 52.0 % 33.0  36.5  37.0   Platelets 150 - 400 K/uL  215         Latest Ref Rng & Units 07/11/2023    7:33 AM 07/11/2023    4:03 AM 07/10/2023   10:50 AM  BMP  Glucose 70 - 99 mg/dL  601    BUN 8 - 23 mg/dL  21     Creatinine 0.93 - 1.24 mg/dL  2.35    Sodium 573 - 220 mmol/L 141  143  145   Potassium 3.5 - 5.1 mmol/L 4.2  4.0  3.5   Chloride 98 - 111 mmol/L  108    CO2 22 - 32 mmol/L  22    Calcium 8.9 - 10.3 mg/dL  8.2    Lactate 2.9  Imaging Reviewed:  CT head: No acute abnormality  EEG 10/8: Generalized myoclonic seizures every 10 seconds to 1 minute and background suppression EEG 07/10/2023 0428 to 07/11/2023 0428  This study was initially suggestive of profound diffuse encephalopathy. Gradually as sedation was weaned, EEG improved and was suggestive of severe diffuse encephalopathy. No seizures were noted.   Assessment: 82 year old patient with history of CAD, aortic stenosis status post TAVR, hypertension, hyperlipidemia, CKD, hearing loss and C-spine fusion presents after sudden cardiac arrest at home with total downtime of about 20 minutes.  He was found to have myoclonic seizures on EEG post intubation.  He was loaded on Keppra and continuous Versed was started.  Seizures were continuing this morning on evaluation, so Versed was increased, propofol and Depakote were added.  Seizures appear to have stopped at this time.  Will need  to get seizures under control and then wean sedation before attempting to perform an exam for prognostication.  Patient will need brain MRI between 3 and 5 days after arrest.  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 MD to update note as needed.   Elmer Picker, DNP,  FNP-BC [facility] Pager: [phone]   If 7pm to 7am, please call on call as listed on AMION.   NEUROHOSPITALIST ADDENDUM Performed a face to face diagnostic evaluation.   I have reviewed the contents of history and physical exam as documented by PA/ARNP/Resident and agree with above documentation.  I have discussed and formulated the above plan as documented. Edits to the note have been made as needed.  Impression/Key exam findings/Plan: off sedation now with patchy brainstem reflexes but no evidence of higher cerebral function. Seizures have not recurred so far. If no seizures on LTM overnight, plan is to get him off LTM and get MRI Brain w/o C tomorrow AM.  Plan discussed with wide Dotty and grandson Karleen Hampshire at the bedside.  Erick Blinks, MD [facility] [phone]   If 7pm to 7am, please call on call as listed on AMION.

## 2023-07-11 NOTE — Progress Notes (Addendum)
Advanced Heart Failure Rounding Note  PCP-Cardiologist: None   Subjective:    Remains intubated and sedated. Not responding to stimuli when sedation weaned. Pending brain MRI today.   Now on vanc + cefepime for possible aspiration PNA. PCT 0.33>>pending. AF overnight. Pressor requirements down. NE now at 6 (down from 18).  Remains on VP 0.03.   Co-ox 90%. CVP 4-5   Several runs of NSVT in the last 24 hrs. Longest was 12 beats. Now on PO amio, current SB low 50s.   K 4.0  Mg 3.3   Wife present at bedside.    Objective:   Weight Range: 81.8 kg Body mass index is 25.88 kg/m.   Vital Signs:   Temp:  [97.3 F (36.3 C)-99.3 F (37.4 C)] 97.9 F (36.6 C) (10/10 0745) Pulse Rate:  [37-62] 50 (10/10 0745) Resp:  [6-25] 22 (10/10 0745) BP: (101-141)/(43-70) 113/51 (10/10 0730) SpO2:  [96 %-100 %] 99 % (10/10 0745) Arterial Line BP: (104-163)/(32-61) 118/45 (10/10 0745) FiO2 (%):  [40 %] 40 % (10/10 0400) Weight:  [81.8 kg] 81.8 kg (10/10 0500) Last BM Date :  (pta)  Weight change: Filed Weights   07/09/23 0100 07/10/23 0233 07/11/23 0500  Weight: 82.8 kg 83.9 kg 81.8 kg    Intake/Output:   Intake/Output Summary (Last 24 hours) at 07/11/2023 0759 Last data filed at 07/11/2023 0700 Gross per 24 hour  Intake 3464.56 ml  Output 2300 ml  Net 1164.56 ml      Physical Exam   CVP 4-5  General:  intubated and sedated  HEENT: normal + ETT  Neck: supple. no JVD. Carotids 2+ bilat; no bruits. No lymphadenopathy or thyromegaly appreciated. Cor: PMI nondisplaced. Regular rate & rhythm. No rubs, gallops or murmurs. Lungs: intubated and clear Abdomen: soft, nontender, nondistended. No hepatosplenomegaly. No bruits or masses. Good bowel sounds. Extremities: no cyanosis, clubbing, rash, edema + RUE PICC  Neuro: intubated and sedated GU: + foley    Telemetry   Sinus brady, low-mid 50s. Brief NSVT overnight   EKG    No new EKG to review   Labs    CBC Recent  Labs    07/10/23 0310 07/10/23 1050 07/11/23 0403  WBC 13.5*  --  9.0  HGB 12.8* 12.6* 11.5*  HCT 39.4 37.0* 36.5*  MCV 94.3  --  96.6  PLT 285  --  215   Basic Metabolic Panel Recent Labs    16/10/96 0310 07/10/23 0829 07/10/23 1050 07/10/23 1734 07/11/23 0403  NA 144  --  145  --  143  K 3.4*  --  3.5  --  4.0  CL 110  --   --   --  108  CO2 20*  --   --   --  22  GLUCOSE 141*  --   --   --  147*  BUN 24*  --   --   --  21  CREATININE 1.88*  --   --   --  1.65*  CALCIUM 7.9*  --   --   --  8.2*  MG  --    < >  --  1.6* 3.3*  PHOS  --   --   --  3.0 4.8*   < > = values in this interval not displayed.   Liver Function Tests Recent Labs    07/09/23 0039 07/10/23 0310  AST 64* 33  ALT 67* 44  ALKPHOS 79 70  BILITOT 0.9 0.8  PROT 6.4* 6.3*  ALBUMIN 3.4* 3.0*   No results for input(s): "LIPASE", "AMYLASE" in the last 72 hours. Cardiac Enzymes No results for input(s): "CKTOTAL", "CKMB", "CKMBINDEX", "TROPONINI" in the last 72 hours.  BNP: BNP (last 3 results) Recent Labs    04/30/23 1246 07/04/23 1146 07/09/23 0039  BNP 217.7* 307.4* 626.8*    ProBNP (last 3 results) No results for input(s): "PROBNP" in the last 8760 hours.   D-Dimer No results for input(s): "DDIMER" in the last 72 hours. Hemoglobin A1C Recent Labs    07/09/23 0039  HGBA1C 7.0*   Fasting Lipid Panel Recent Labs    07/10/23 0310  TRIG 123   Thyroid Function Tests No results for input(s): "TSH", "T4TOTAL", "T3FREE", "THYROIDAB" in the last 72 hours.  Invalid input(s): "FREET3"  Other results:   Imaging    DG Abd 1 View  Result Date: 07/10/2023 CLINICAL DATA:  Orogastric tube placement. EXAM: ABDOMEN - 1 VIEW COMPARISON:  07/09/2023 FINDINGS: Tip and side port of the enteric tube below the diaphragm in the stomach. Decreased gaseous gastric distension. No small bowel dilatation in the upper abdomen. IMPRESSION: Tip and side port of the enteric tube below the diaphragm in  the stomach. Electronically Signed   By: Narda Rutherford M.D.   On: 07/10/2023 18:21   Overnight EEG with video  Result Date: 07/10/2023 Charlsie Quest, MD     07/10/2023 10:14 AM Patient Name: HAMAD WHYTE MRN: [id number] Epilepsy Attending: Charlsie Quest Referring Physician/Provider: Oretha Milch, MD Duration: 07/09/2023 4098 to 07/10/2023 1191  Patient history: 82yo M s/p cardiac arrest getting eeg to evaluate for seizure  Level of alertness: comatose  AEDs during EEG study: LEV, VPA, propofol, versed  Technical aspects: This EEG study was done with scalp electrodes positioned according to the 10-20 International system of electrode placement. Electrical activity was reviewed with band pass filter of 1-70Hz , sensitivity of 7 uV/mm, display speed of 23mm/sec with a 60Hz  notched filter applied as appropriate. EEG data were recorded continuously and digitally stored.  Video monitoring was available and reviewed as appropriate.  Description: At the beginning of the study, patient was noted to have episodes of brief sudden whole body jerking every 1-6 seconds. Concomitant EEG showed generalized polyspikes consistent with myoclonic seizures. In between seizures EEG showed generalized background suppression. As medications were adjusted, after around 1030 on 07/09/2023, EEG showed generalized background suppression.   Hyperventilation and photic stimulation were not performed.    ABNORMALITY - Myoclonic seizure, generalized - Background suppression, generalized  IMPRESSION: At the beginning of the study, patient was noted to have myoclonic seizures every 1-6 seconds. As medications were adjusted, after around 1030 on 07/09/2023, seizures abated. Additionally there was evidence of profound diffuse encephalopathy.  In the setting of cardiac arrest, this EEG pattern is suggestive of anoxic/hypoxic brain injury.   Priyanka Annabelle Harman     Medications:     Scheduled Medications:  amiodarone  200 mg Per Tube BID    aspirin  81 mg Per Tube Daily   atorvastatin  40 mg Per Tube Daily   Chlorhexidine Gluconate Cloth  6 each Topical Daily   docusate  100 mg Per Tube BID   feeding supplement (PROSource TF20)  60 mL Per Tube Daily   insulin aspart  0-15 Units Subcutaneous Q4H   mouth rinse  15 mL Mouth Rinse Q2H   pantoprazole (PROTONIX) IV  40 mg Intravenous Q24H   polyethylene glycol  17 g Per  Tube Daily   potassium chloride  20 mEq Per Tube Once   sodium chloride flush  10-40 mL Intracatheter Q12H    Infusions:  ceFEPime (MAXIPIME) IV Stopped (07/11/23 0133)   feeding supplement (VITAL AF 1.2 CAL) 20 mL/hr at 07/11/23 0700   fentaNYL infusion INTRAVENOUS 75 mcg/hr (07/11/23 0700)   levETIRAcetam Stopped (07/10/23 2151)   norepinephrine (LEVOPHED) Adult infusion 6 mcg/min (07/11/23 0700)   propofol (DIPRIVAN) infusion 20 mcg/kg/min (07/11/23 0700)   valproate sodium Stopped (07/11/23 0502)   [START ON 07/12/2023] vancomycin     vasopressin 0.03 Units/min (07/11/23 0700)    PRN Medications: acetaminophen, docusate, fentaNYL (SUBLIMAZE) injection, fentaNYL (SUBLIMAZE) injection, midazolam, mouth rinse, polyethylene glycol, sodium chloride flush    Patient Profile   82 y.o. male with history of CAD s/p LAD PCI in 2021 (nonobstructive dz on repeat LHC in 2023), aortic stenosis s/p TAVR, chronic systolic HF last known EF 35-40% and chronic LBBB admitted for out of hospital VT arrest.   Assessment/Plan   1. VT arrest: has baseline cardiomyopathy, last echo with EF 35-40%.  HS-TnI elevated 562 => 1221.  Mg was low at 1.3.  Suspect VT arrest due to cardiomyopathy rather than ischemia (doubt ACS).   - Currently sinus brady in 50s. Several runs of NSVT overnight - continue PO amiodarone 200 mg bid per tube - keep K > 4.0 and Mg > 2.0 - If he has neurologic recovery, would favor CRT-D device.   2. Chronic systolic CHF: Echo in 5/23 with EF 30-35%, normally-functioning bioprosthetic AoV and normal  RV.  Most recent prior echo from 5/22 showed EF 50-55%.  Fall in EF was associated with CHF exacerbation and atypical chest pain.  He also has LBBB that is new since 3/22.  RHC/LHC was done in 6/23; filling pressures were optimized and CO was normal, moderate disease in OM2 did not explain fall in EF.  Repeat Echo 12/23 EF 30% with global hypokinesis, normal RV, normal bioprosthetic aortic valve with mean gradient 10 and no AI (s/p TAVR), IVC normal.  Echo in 7/24 showed EF mildly better at 35-40% with septal-lateral dyssynchrony, normal RV, IVC normal, s/p TAVR with mean gradient 13 mmHg and no paravalvular leakage. Nonischemic cardiomyopathy, ?LBBB cardiomyopathy. NYHA class II and stable symptoms prior to this event.  Was awaiting cardiac MRI to help make decision on CRT-D or CRT-P device. Echo post arrest, EF 25-30%, RV mildly reduced. No RWMA to suggest ischemia. Currently on NE 6 + VP 0.03. Co-ox 90%. CVP 4-5  - Hold GDMT for now with hypotension.  - no diuretics w/ low CVP  3. CAD: PCI to proximal LAD pre-TAVR in 4/21, not associated with ACS.  Cath in 6/23 showed 70% OM2 stenosis, medically managed. No chest pain at home.  VT arrest as above, HS-TnI elevated but suspect this may be demand ischemia related to arrest.  Had been having no chest pain prior.  Doubt ACS.  - Continue ASA 81 mg  - Continue atorvastatin 40 daily.  - If he has neurologic recovery, will need coronary evaluation.   4. 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  Baseline Scr ~1.3. Creatinine 1.57 on admit and peaked to 1.9. Improved today at 1.65 - continue NE + VP for BP support  - follow BMP  5. Aortic stenosis: S/p TAVR with [employer] Evolut valve, normal valve function on 7/24 echo.   - stable on echo this admit  6. Neuro: Myoclonic jerking earlier. Not responding to stimuli when sedation  weaned. Worried that this signifies anoxic brain injury from arrest.  Neurology is following, he is undergoing EEG now. Planing brain MRI today  - prognosis  is poor. Discuss w/ wife need for family discussion regarding comfort care, change of code status. Currently remains fully code.  7. Hypomagnesemia/Hypokalemia: repleated - keep K > 4.0 and Mg > 2.0  8. Acute hypoxemic respiratory failure: In setting of arrest.  - vent management per PCCM    Length of Stay: [address] Delmer Islam  07/11/2023, 7:59 AM  Advanced Heart Failure Team Pager [phone] (M-F; 7a - 5p)  Please contact [facility] for night-coverage after hours (5p -7a ) and weekends on [email or web address]  Patient seen with PA, agree with the above note.   Weaning off sedation, no purposeful response.    MAP stable on NE 4, vasopressin 0.03.  CVP 5.  Co-ox remains surprisingly high at 91%.   He is in NSR in 50s on amiodarone 200 mg bid, had short NSVT runs.   General: Sedated on vent Neck: No JVD, no thyromegaly or thyroid nodule.  Lungs: Clear to auscultation bilaterally with normal respiratory effort. CV: Nondisplaced PMI.  Heart regular S1/S2, no S3/S4, no murmur.  No peripheral edema.   Abdomen: Soft, nontender, no hepatosplenomegaly, no distention.  Skin: Intact without lesions or rashes.  Neurologic: No purposeful movement Extremities: No clubbing or cyanosis.  HEENT: Normal.    HR in 50s, ok to keep on amiodarone 200 mg bid.   MAP stable, can wean pressors as able.  CVP optimized.   Worried for significant anoxic encephalopathy.  Will get MRI brain later today.   CRITICAL CARE Performed by: Marca Ancona  Total critical care time: 35 minutes  Critical care time was exclusive of separately billable procedures and treating other patients.  Critical care was necessary to treat or prevent imminent or life-threatening deterioration.  Critical care was time spent personally by me on the following activities: development of treatment plan with patient and/or surrogate as well as nursing, discussions with consultants, evaluation of patient's response to treatment,  examination of patient, obtaining history from patient or surrogate, ordering and performing treatments and interventions, ordering and review of laboratory studies, ordering and review of radiographic studies, pulse oximetry and re-evaluation of patient's condition.  Marca Ancona 07/11/2023 9:54 AM

## 2023-07-12 ENCOUNTER — Inpatient Hospital Stay (HOSPITAL_COMMUNITY): Payer: No Typology Code available for payment source

## 2023-07-12 DIAGNOSIS — G931 Anoxic brain damage, not elsewhere classified: Secondary | ICD-10-CM

## 2023-07-12 DIAGNOSIS — I5022 Chronic systolic (congestive) heart failure: Secondary | ICD-10-CM | POA: Diagnosis not present

## 2023-07-12 DIAGNOSIS — R569 Unspecified convulsions: Secondary | ICD-10-CM | POA: Diagnosis not present

## 2023-07-12 DIAGNOSIS — I469 Cardiac arrest, cause unspecified: Secondary | ICD-10-CM | POA: Diagnosis not present

## 2023-07-12 LAB — BASIC METABOLIC PANEL
Anion gap: 8 (ref 5–15)
BUN: 35 mg/dL — ABNORMAL HIGH (ref 8–23)
CO2: 22 mmol/L (ref 22–32)
Calcium: 7.8 mg/dL — ABNORMAL LOW (ref 8.9–10.3)
Chloride: 114 mmol/L — ABNORMAL HIGH (ref 98–111)
Creatinine, Ser: 1.34 mg/dL — ABNORMAL HIGH (ref 0.61–1.24)
GFR, Estimated: 53 mL/min — ABNORMAL LOW (ref >60)
Glucose, Bld: 195 mg/dL — ABNORMAL HIGH (ref 70–99)
Potassium: 4.1 mmol/L (ref 3.5–5.1)
Sodium: 144 mmol/L (ref 135–145)

## 2023-07-12 LAB — MAGNESIUM: Magnesium: 2.7 mg/dL — ABNORMAL HIGH (ref 1.7–2.4)

## 2023-07-12 LAB — CBC
HCT: 32.8 % — ABNORMAL LOW (ref 39.0–52.0)
Hemoglobin: 10.3 g/dL — ABNORMAL LOW (ref 13.0–17.0)
MCH: 30.6 pg (ref 26.0–34.0)
MCHC: 31.4 g/dL (ref 30.0–36.0)
MCV: 97.3 fL (ref 80.0–100.0)
Platelets: 165 10*3/uL (ref 150–400)
RBC: 3.37 MIL/uL — ABNORMAL LOW (ref 4.22–5.81)
RDW: 13.5 % (ref 11.5–15.5)
WBC: 6.8 10*3/uL (ref 4.0–10.5)
nRBC: 0 % (ref 0.0–0.2)

## 2023-07-12 LAB — GLUCOSE, CAPILLARY
Glucose-Capillary: 147 mg/dL — ABNORMAL HIGH (ref 70–99)
Glucose-Capillary: 161 mg/dL — ABNORMAL HIGH (ref 70–99)
Glucose-Capillary: 184 mg/dL — ABNORMAL HIGH (ref 70–99)
Glucose-Capillary: 190 mg/dL — ABNORMAL HIGH (ref 70–99)
Glucose-Capillary: 197 mg/dL — ABNORMAL HIGH (ref 70–99)
Glucose-Capillary: 98 mg/dL (ref 70–99)

## 2023-07-12 LAB — PHOSPHORUS: Phosphorus: 2.4 mg/dL — ABNORMAL LOW (ref 2.5–4.6)

## 2023-07-12 LAB — PROCALCITONIN: Procalcitonin: 0.3 ng/mL

## 2023-07-12 MED ORDER — FREE WATER
200.0000 mL | Freq: Three times a day (TID) | Status: DC
  Administered 2023-07-12 – 2023-07-13 (×2): 200 mL

## 2023-07-12 MED ORDER — VITAL AF 1.2 CAL PO LIQD
1000.0000 mL | ORAL | Status: DC
  Administered 2023-07-12 (×2): 1000 mL

## 2023-07-12 NOTE — Progress Notes (Signed)
Patient was transported to MRI & back to 2H12 without any complications.

## 2023-07-12 NOTE — IPAL (Signed)
  Interdisciplinary Goals of Care Family Meeting   Date carried out:: 07/12/2023  Location of the meeting: Bedside  Member's involved: Physician, Nurse Practitioner, Bedside Registered Nurse, and Family Member or next of kin  Durable Power of Attorney or acting medical decision maker: wife    Discussion: We discussed goals of care for Andre Jimenez .    MRI brain completed.  Results confirmed with Neurology of read, who agrees.   1. Findings consistent with global hypoxic-ischemic injury. No significant swelling or herniation. 2. Additional punctate foci of restricted diffusion in the left parietal and right frontal lobes which could reflect superimposed acute embolic infarcts. 3. Numerous chronic infarcts as above   Wife, son, and grandson at bedside.  Went to discuss results with family w/ bedside RN, Evelene Croon.  Family did not want to discuss results at this time as other son and other family had just left.  However, wife verbalized by my tone of voice, that they know "its bad".  No specifics discussed as family wishes to wait till tomorrow to have family discussion, likely around noon.    Did advocate to change code status to DNR in the interim given irreversible injury and to prevent any further possible suffering.   Wife discussed privately with son and then confirmed to change code status to DNR.  Code status changed to reflect.    Code status: No CPR, continue current care  Disposition: Continue current acute care   Time spent for the meeting: 20 mins      Posey Boyer, MSN, AG-ACNP-BC [facility] 07/12/2023, 6:42 PM  See Amion for pager If no response to pager , please call [phone] until 7pm After 7:00 pm call Elink  [phone]

## 2023-07-12 NOTE — Progress Notes (Signed)
Initial Nutrition Assessment  DOCUMENTATION CODES:   Not applicable  INTERVENTION:   Tube Feeding via OG: Vital AF 1.2 at 60 ml/hr TF provides 1728 kcals, 108 g of protein and 1166 mL of free water  Add free water flush of 200 mL q 8 hours; total free water of 1766 mL  D/C Pro-Source TF20   NUTRITION DIAGNOSIS:   Inadequate oral intake related to acute illness as evidenced by NPO status.  GOAL:   Patient will meet greater than or equal to 90% of their needs  MONITOR:   Vent status, Labs, Weight trends, TF tolerance  REASON FOR ASSESSMENT:   Consult, Ventilator Enteral/tube feeding initiation and management  ASSESSMENT:   82 yo male admitted post VT arrest requiring intubation-concern for anoxic injury with myoclonic seizures, AKI on CKD 3, shock requiring pressors. PMH includes CKD 3, chronic HFrEF, CM EF 25-30%, aortic stenosis s/p TAVR in 2021, DM, HTN  10/07 Admitted, VTach arrest 10/08 +seizures, +pressors 10/09 TF initiated, OG repositioned  Pt remains on vent support, MRI brain today. EEG today with no seizures, severe diffuse encephalopathy, epileptogenicity with generalized onset  Currently off pressors  Tolerating Vital AF 1.2 at 40 ml/hr via OG. Repeat xray on 10/09 with tip in stomach  Current wt 81.8 kg Admit wt 83 kg  UOP 1.84 L in 24 hours, only 695 mL thus far today. Noted sodium and BUN trending up  +constipation, no BM since admission  Labs: sodium 144 (wdl), potassium 4.1 (wdl), phosphorus 2.4 (L), magnesium 2.7 (H), BUN 35, Creatinine 1.34 Meds: ss novolog, miralax, colace   Diet Order:   Diet Order             Diet NPO time specified Except for: Other (See Comments)  Diet effective now                   EDUCATION NEEDS:   Not appropriate for education at this time  Skin:  Skin Assessment: Reviewed RN Assessment  Last BM:  PTA  Height:   Ht Readings from Last 1 Encounters:  07/09/23 5\' 10"  (1.778 m)    Weight:    Wt Readings from Last 1 Encounters:  07/11/23 81.8 kg   BMI:  Body mass index is 25.88 kg/m.  Estimated Nutritional Needs:   Kcal:  1650-1850 kcals  Protein:  100-115 g  Fluid:  1.8L   Romelle Starcher MS, RDN, LDN, CNSC Registered Dietitian 3 Clinical Nutrition RD Pager and On-Call Pager Number Located in [place]

## 2023-07-12 NOTE — Progress Notes (Signed)
Nursing staff noted patient displaying seizure-like activity with brisk generalized shaking, generalized rigidity, and extremely elevated blood pressure after a small turn in bed for positional change with pillow. Remained otherwise unresponsive to pain/stimulation. Oxygen saturation remained within normal defined limits and EEG monitor remained connected to patient for duration of episode. Neurology paged and made aware of new seizure-like changes; RN asked by Neurology to note of episode and continue to monitor.

## 2023-07-12 NOTE — Progress Notes (Addendum)
Advanced Heart Failure Rounding Note  PCP-Cardiologist: None   Subjective:    Pressors weaned off overnight.   Intermittent seizure-like activity since 2 am. Episodes associated with hypertension. Responds to boluses of versed.   He does not wake up and follow commands off sedation. No response to painful stimuli.   Wife at bedside.  Objective:   Weight Range: 81.8 kg Body mass index is 25.88 kg/m.   Vital Signs:   Temp:  [98.1 F (36.7 C)-100.2 F (37.9 C)] 99.7 F (37.6 C) (10/11 0700) Pulse Rate:  [50-94] 71 (10/11 0700) Resp:  [17-28] 25 (10/11 0744) BP: (84-176)/(42-83) 176/65 (10/11 0744) SpO2:  [98 %-100 %] 99 % (10/11 0744) Arterial Line BP: (112-200)/(36-67) 137/43 (10/11 0700) FiO2 (%):  [40 %] 40 % (10/11 0744) Last BM Date :  (PTA)  Weight change: Filed Weights   07/09/23 0100 07/10/23 0233 07/11/23 0500  Weight: 82.8 kg 83.9 kg 81.8 kg    Intake/Output:   Intake/Output Summary (Last 24 hours) at 07/12/2023 0802 Last data filed at 07/12/2023 0700 Gross per 24 hour  Intake 1744.81 ml  Output 1615 ml  Net 129.81 ml      Physical Exam   CVP 5 General: Critically ill elderly male HEENT: + ETT, EEG leads present Neck: supple. no JVD. Carotids 2+ bilat; no bruits.  Cor: PMI nondisplaced. Regular rate & rhythm. No rubs, gallops or murmurs. Lungs: clear Abdomen: soft, nondistended Extremities: no cyanosis, clubbing, rash, edema, RUE PICC Neuro: Unresponsive off sedation    Telemetry   SR 70s, several brief runs NSVT (up to 4 beats)   Labs    CBC Recent Labs    07/11/23 0403 07/11/23 0733 07/12/23 0445  WBC 9.0  --  6.8  HGB 11.5* 11.2* 10.3*  HCT 36.5* 33.0* 32.8*  MCV 96.6  --  97.3  PLT 215  --  165   Basic Metabolic Panel Recent Labs    16/10/96 0403 07/11/23 0733 07/11/23 1514 07/12/23 0445  NA 143 141  --  144  K 4.0 4.2  --  4.1  CL 108  --   --  114*  CO2 22  --   --  22  GLUCOSE 147*  --   --  195*  BUN  21  --   --  35*  CREATININE 1.65*  --   --  1.34*  CALCIUM 8.2*  --   --  7.8*  MG 3.3*  --  2.7* 2.7*  PHOS 4.8*  --  3.4 2.4*   Liver Function Tests Recent Labs    07/10/23 0310  AST 33  ALT 44  ALKPHOS 70  BILITOT 0.8  PROT 6.3*  ALBUMIN 3.0*   No results for input(s): "LIPASE", "AMYLASE" in the last 72 hours. Cardiac Enzymes No results for input(s): "CKTOTAL", "CKMB", "CKMBINDEX", "TROPONINI" in the last 72 hours.  BNP: BNP (last 3 results) Recent Labs    04/30/23 1246 07/04/23 1146 07/09/23 0039  BNP 217.7* 307.4* 626.8*    ProBNP (last 3 results) No results for input(s): "PROBNP" in the last 8760 hours.   D-Dimer No results for input(s): "DDIMER" in the last 72 hours. Hemoglobin A1C No results for input(s): "HGBA1C" in the last 72 hours.  Fasting Lipid Panel Recent Labs    07/10/23 0310  TRIG 123   Thyroid Function Tests No results for input(s): "TSH", "T4TOTAL", "T3FREE", "THYROIDAB" in the last 72 hours.  Invalid input(s): "FREET3"  Other results:  Imaging    No results found.   Medications:     Scheduled Medications:  amiodarone  200 mg Per Tube BID   aspirin  81 mg Per Tube Daily   atorvastatin  40 mg Per Tube Daily   Chlorhexidine Gluconate Cloth  6 each Topical Daily   docusate  100 mg Per Tube BID   feeding supplement (PROSource TF20)  60 mL Per Tube Daily   heparin injection (subcutaneous)  5,000 Units Subcutaneous Q8H   insulin aspart  0-15 Units Subcutaneous Q4H   mouth rinse  15 mL Mouth Rinse Q2H   pantoprazole (PROTONIX) IV  40 mg Intravenous Q24H   polyethylene glycol  17 g Per Tube Daily   potassium chloride  20 mEq Per Tube Once   sodium chloride flush  10-40 mL Intracatheter Q12H    Infusions:  ceFEPime (MAXIPIME) IV Stopped (07/12/23 0254)   feeding supplement (VITAL AF 1.2 CAL) 40 mL/hr at 07/12/23 0700   levETIRAcetam Stopped (07/11/23 2146)   norepinephrine (LEVOPHED) Adult infusion Stopped (07/12/23 0450)    propofol (DIPRIVAN) infusion 0 mcg/kg/min (07/11/23 0920)   valproate sodium Stopped (07/12/23 1610)   vasopressin Stopped (07/11/23 1524)    PRN Medications: acetaminophen, docusate, fentaNYL (SUBLIMAZE) injection, midazolam, mouth rinse, polyethylene glycol, sodium chloride flush    Patient Profile   82 y.o. male with history of CAD s/p LAD PCI in 2021 (nonobstructive dz on repeat LHC in 2023), aortic stenosis s/p TAVR, chronic systolic HF and chronic LBBB. Admitted for out of hospital VT arrest.   Assessment/Plan   1. VT arrest: has baseline cardiomyopathy, last echo with EF 35-40%.  HS-TnI elevated 562 => 1221.  Mg was low at 1.3.  Suspect VT arrest due to cardiomyopathy rather than ischemia (doubt ACS).   - Currently sinus brady in 50s. Several runs of NSVT overnight - continue PO amiodarone 200 mg bid per tube - keep K > 4.0 and Mg > 2.0 - CRT-D if neurological recovery. However, there is significant concern for anoxic/hypoxic brain injury.  2. Chronic systolic CHF: Echo in 5/23 with EF 30-35%, normally-functioning bioprosthetic AoV and normal RV.  Most recent prior echo from 5/22 showed EF 50-55%.  Fall in EF was associated with CHF exacerbation and atypical chest pain.  He also has LBBB that is new since 3/22.  RHC/LHC was done in 6/23; filling pressures were optimized and CO was normal, moderate disease in OM2 did not explain fall in EF.  Repeat Echo 12/23 EF 30% with global hypokinesis, normal RV, normal bioprosthetic aortic valve with mean gradient 10 and no AI (s/p TAVR), IVC normal.  Echo in 7/24 showed EF mildly better at 35-40% with septal-lateral dyssynchrony, normal RV, IVC normal, s/p TAVR with mean gradient 13 mmHg and no paravalvular leakage. Nonischemic cardiomyopathy, ?LBBB cardiomyopathy. NYHA class II and stable symptoms prior to this event.  Was awaiting cardiac MRI to help make decision on CRT-D or CRT-P device. Echo post arrest, EF 25-30%, RV mildly reduced. No RWMA  to suggest ischemia.  - Now off all pressors. Intermittently hypertensive with seizure activity. Improves with versed boluses. CVP 5, does not need diuretic.  - Hold GDMT for now 3. CAD: PCI to proximal LAD pre-TAVR in 4/21, not associated with ACS.  Cath in 6/23 showed 70% OM2 stenosis, medically managed. No chest pain at home.  VT arrest as above, HS-TnI elevated but suspect this may be demand ischemia related to arrest.  Had been having no chest pain prior.  Doubt ACS.  - Continue ASA 81 mg  - Continue atorvastatin 40 daily.  - Holding off on ischemic evaluation until neuro prognostication complete. 4. 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  Baseline Scr ~1.3. Creatinine 1.57 on admit and peaked to 1.9. Improved today at 1.3 5. Aortic stenosis: S/p TAVR with Medtronic Evolut valve, normal valve function on 7/24 echo.   - stable on echo this admit  6. Neuro: Myoclonic jerking earlier. Not responding to stimuli off sedation. Worried that this signifies anoxic brain injury from arrest.  More seizure activity this am. Cannot complete MRI until seizures better controlled. - Concern for poor prognosis 7. Hypomagnesemia/Hypokalemia: repleated - keep K > 4.0 and Mg > 2.0  8. Acute hypoxemic respiratory failure: In setting of arrest.  - vent management per PCCM    Length of Stay: 4  FINCH, LINDSAY N, PA-C  07/12/2023, 8:02 AM  Advanced Heart Failure Team Pager [phone] (M-F; 7a - 5p)  Please contact [facility] for night-coverage after hours (5p -7a ) and weekends on [email or web address]  Patient seen with PA, agree with the above note.   Off NE with stable MAP.  HR in 60s on amiodarone per tube.  He remains on cefepime empirically for ?aspiration PNA.  CVP 5.   Sedation weaned, not responsive. He has had myoclonus.   General: On vent.  Neck: No JVD, no thyromegaly or thyroid nodule.  Lungs: Clear to auscultation bilaterally with normal respiratory effort. CV: Nondisplaced PMI.  Heart regular S1/S2, no S3/S4,  no murmur.  No peripheral edema.   Abdomen: Soft, no hepatosplenomegaly, no distention.  Skin: Intact without lesions or rashes.  Neurologic: Not responsive Extremities: No clubbing or cyanosis.  HEENT: Normal.   Continue amiodarone per tube, has had NSVT runs.   Off pressors with CVP 5.  No diuretic needed.   Worry for anoxic encephalopathy.  Of sedation with no response, has myoclonus.  Will get brain MRI today for prognostication.   CRITICAL CARE Performed by: Marca Ancona  Total critical care time: 35 minutes  Critical care time was exclusive of separately billable procedures and treating other patients.  Critical care was necessary to treat or prevent imminent or life-threatening deterioration.  Critical care was time spent personally by me on the following activities: development of treatment plan with patient and/or surrogate as well as nursing, discussions with consultants, evaluation of patient's response to treatment, examination of patient, obtaining history from patient or surrogate, ordering and performing treatments and interventions, ordering and review of laboratory studies, ordering and review of radiographic studies, pulse oximetry and re-evaluation of patient's condition.  Marca Ancona 07/12/2023 10:52 AM

## 2023-07-12 NOTE — Progress Notes (Signed)
LTM EEG discontinued - no skin breakdown at unhook.   

## 2023-07-12 NOTE — Progress Notes (Signed)
NAME:  Andre Jimenez, MRN:  [id number], DOB:  [birth year], LOS: 4 ADMISSION DATE:  07-25-2023, CONSULTATION DATE:  25-Jul-2023 REFERRING MD:  Dr. Ledon Snare, CHIEF COMPLAINT:  VT arrest   History of Present Illness:  Patient is a 82 yo M w/ pertinent PMH systolic chf, Aortic stenosis s/p TAVR 01/2020, LBBB, CAD, htn, t2dm, ckd stage 3a presents to [facility] ED on 07/25/2023 for VT arrest.  On 2023-07-25 patient doing well during the day. Wife found him unresponsive in yard and called EMS and started CPR for about 12 minutes by wife until ems arrived. Initial rhythm vtach and was shocked. Patient had multiple round of cpr with intermittent ROSC about 4 times. Patient was shocked 2 times during cpr and intubated. Estimated total ROSC about 20 minutes. Unresponsive post rosc and transported to [facility] ED.   On arrival BP stable. EKG w/ LBBB and no ST elevation which is similar to previous EKG. Bedside echo w/ EF about 30% and no evidence of R heart strain. Patient intubated and sats stable on vent. CXR no significant findings. Remains unresponsive. CT head no acute abnormality. CT chest/abd/pelvis no acute findings. VBG ph 7.19 and co2 43. LA 6.7. K 3.2. Glucose 320. Trop 562. Cards consulted. PCCM consulted for icu admission.   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 Now requiring vasopressors.  10/9 weaning sedation on EEG, NE requirements up, vaso added, abx- vanc/ cefepime 10/10 off pressors, weaning sedation on cEEG, some NSVT, Mag 1.6  Interim History / Subjective:  Remains off pressors and off continuous sedation Having stimulus induced myoclonus this am with hypertension, treated with prn versed Remains on cEEG, plans for MRI brain today   Objective   Blood pressure (!) 176/65, pulse 71, temperature 99.7 F (37.6 C), resp. rate (!) 25, height 5\' 10"  (1.778 m), weight 81.8 kg, SpO2 99%. CVP:  [5 mmHg-45 mmHg] 5 mmHg  Vent Mode: PRVC FiO2 (%):  [40 %] 40 % Set Rate:  [22 bmp] 22 bmp Vt Set:  [440 mL] 440 mL PEEP:  [5 cmH20] 5 cmH20 Plateau Pressure:  [11 cmH20-16 cmH20] 16 cmH20   Intake/Output Summary (Last 24 hours) at 07/12/2023 1047 Last data filed at 07/12/2023 0900 Gross per 24 hour  Intake 1528.26 ml  Output 1940 ml  Net -411.74 ml   Filed Weights   07/09/23 0100 07/10/23 0233 07/11/23 0500  Weight: 82.8 kg 83.9 kg 81.8 kg   Examination: General:  critically ill appearing elderly male lying in bed in NAD HEENT: MM pink/moist, ETT/ OGT, pupils 4/more reactive today, mild scleral edema, absent corneals  Neuro: myoclonus noted starting in face, neck, chest/ abd and arms with noxious stimuli becoming hypertensive with SBP 180s, otherwise non responsive CV: rr, NSR, distant, RUE PICC PULM:  MV supported, clear, minimal intermittent cough w/suctioning, no secretions, no gag.  Triggering breaths on PSV GI: soft, bs+, ND, foley- cyu Extremities: warm/dry, trace generalized edema  Skin: no rashes   afebrile Labs reviewed > sCr improving, Mag 2.7, WBC 6.8, Hgb trend 12.8> 11.5> 10.3, PCT 0.33> 0.3  UOP 1.8L/ 24hrs after diureses yest Net +158 ml  10/8 Echo-LVEF 25 to 30%,  global hypokinesis.  Moderately enlarged RV, low normal function.  Dilated atrium.  Trivial MR.  No mitral stenosis.  Replaced aortic valve, no regurgitation or stenosis. No RWA  10/11 CXR> stable ETT/ lines, hazy opacity right lung base, some vascular congestion  Resolved Hospital Problem list   Lactic acidosis Hypokalemia Hypomagnesia   Assessment & Plan:  VT arrest - suspect this is related to his chronic cardiomyopathy and Mag 1.3 on admit.  No reports of acute exacerbation of symptoms or worsening abdominal distention per family. Chronic HFrEF, cardiomyopathy EF 25-30% (previously 35-40%) Aortic stenosis s/p TAVR 01/2020 Hx of LBBB CAD - tele monitoring - appreciate HF assistance - goal MAP > 65, remains off pressors.   - amiodarone enterally - optimize electrolytes, K> 4, Mag > 2 - cont to hold GDMT for now, likely add back 10/12 - cont ASA, statin   Acute respiratory failure with hypoxia postcardiac arrest  RLL aspiration - cont PRVC w/ rate 22, 8cc/kg IBW - goal Pplat <30 and DP<15  - VAP prevention protocol/ PPI - PAD protocol for sedation> minimize as able.  Prn fentanyl.  Restart low dose propofol prn myoclonus, prn versed for refractory myoclonus - intermittent CXR/ ABG - goal SpO2 >92% - daily SAT & SBT> initiating breaths, PSV as tolerated however mental status remains barrier to extubation - abx as below   AKI on CKD 3a - likely due to hypoperfusion from cardiac arrest/ poor perfusion state - sCr and UOP stable - cont foley - pending UA - trend renal indices  - strict I/Os, daily wts - avoid nephrotoxins, renal dose meds, hemodynamic support as above   Acute encephalopathy: post arrest; concern for anoxic injury Myoclonic seizures - Neurology following, appreciate input - stopping cEEG - MRI brain today  - AEDS per neuro> keppra and VPA - low dose propofol prn for now, versed for refractory myoclonus - seizure precautions - AEDS per neuro> keppra  and VPA - cont to avoid fever - neuro exam remains poor with intermittent myoclonus tends for poor functional recovery and prognostic sign. No purposeful movements noted thus far.  Continue to minimize sedation as able. Ongoing GOC, remains full code.     HTN - cont holding PTA Entresto, spironolactone, and carvedilol.  Likely add back as hemodynamics continue to improve   Type 2 DM - Holding PTA metformin and Jardiance - SSI prn   Shock, resolved Leukocytosis, most likely reactive vs sepsis of unclear source - hypotension suspected from sedation requirements, now resolved - possible RLL aspiration vs atelectasis on CXR today - no sputum to send for culture - UA neg - will de-escalate cefepime to ctx, PCT down trending - trend fever/ WBC curve   At risk for malnutrition - TF per RD  Normocytic Anemia - slow drift in Hgb off systemic heparin.  No evidence of bleeding, continue to monitor  GOC - wife and son updated at bedside  10/11. Ongoing GOC, family is waiting for MRI results prior to any code changes   Best Practice (right click and "Reselect all SmartList Selections" daily)   Diet/type: tubefeeds and NPO w/ meds via tube DVT prophylaxis: prophylactic heparin  GI prophylaxis: PPI Lines: Central line and Arterial Line> d/c aline Foley:  Yes, and it is still needed Code Status:  full code Last date of multidisciplinary goals of care discussion 2023-07-09 updated family at bedside]   Labs   CBC: Recent Labs  Lab 07/09/23 0041 07/09/23 0257 07/10/23 0310 07/10/23 1050 07/11/23 0403 07/11/23 0733 07/12/23 0445  WBC 15.2* 18.2* 13.5*  --  9.0  --  6.8  HGB 12.3* 12.7* 12.8* 12.6* 11.5* 11.2* 10.3*  HCT 38.3* 39.2 39.4 37.0* 36.5* 33.0* 32.8*  MCV 94.8 92.0 94.3  --  96.6  --  97.3  PLT 279 292 285  --  215  --  165    Basic Metabolic Panel: Recent Labs  Lab 07/09/23 0257 07/09/23 0752 07/10/23 0310 07/10/23 0829 07/10/23 1050 07/10/23 1734 07/11/23 0403  07/11/23 0733 07/11/23 1514 07/12/23 0445  NA 139 139 144  --  145  --  143 141  --  144  K 3.7 3.8 3.4*  --  3.5  --  4.0 4.2  --  4.1  CL 107 106 110  --   --   --  108  --   --  114*  CO2 19* 17* 20*  --   --   --  22  --   --  22  GLUCOSE 206* 158* 141*  --   --   --  147*  --   --  195*  BUN 20 24* 24*  --   --   --  21  --   --  35*  CREATININE 1.40* 1.57* 1.88*  --   --   --  1.65*  --   --  1.34*  CALCIUM 8.0* 7.8* 7.9*  --   --   --  8.2*  --   --  7.8*  MG 2.2  --   --  2.1  --  1.6* 3.3*  --  2.7* 2.7*  PHOS 2.9  --   --   --   --  3.0 4.8*  --  3.4 2.4*   GFR: Estimated Creatinine Clearance: 44.6 mL/min (A) (by C-G formula based on SCr of 1.34 mg/dL (H)). Recent Labs  Lab July 09, 2023 2108 07/09/23 0039 07/09/23 0041 07/09/23 0257 07/10/23 0310 07/10/23 1734 07/11/23 0403 07/12/23 0445  PROCALCITON  --   --   --   --   --  0.33  --  0.30  WBC  --   --    < > 18.2* 13.5*  --  9.0 6.8  LATICACIDVEN 6.7* 3.8*  --  2.9* 2.5*  --   --   --    < > = values in this interval not displayed.    Liver Function Tests: Recent Labs  Lab 07/09/23 0039 07/10/23 0310  AST 64* 33  ALT 67* 44  ALKPHOS 79 70  BILITOT 0.9 0.8  PROT 6.4* 6.3*  ALBUMIN 3.4* 3.0*   No results for input(s): "LIPASE", "AMYLASE" in the last 168 hours. No results for input(s): "AMMONIA" in the last 168 hours.  ABG    Component Value Date/Time   PHART 7.317 (L) 07/11/2023 0733   PCO2ART 42.0 07/11/2023 0733   PO2ART 170 (H) 07/11/2023 2130  HCO3 21.7 07/11/2023 0733   TCO2 23 07/11/2023 0733   ACIDBASEDEF 4.0 (H) 07/11/2023 0733   O2SAT 99 07/11/2023 0733      Critical care time: 33 mins      Posey Boyer, MSN, AG-ACNP-BC [facility] 07/12/2023, 10:47 AM  See Amion for pager If no response to pager , please call [phone] until 7pm After 7:00 pm call Elink  [phone]

## 2023-07-12 NOTE — Progress Notes (Signed)
eLink Physician-Brief Progress Note Patient Name: Andre Jimenez DOB: January 21, 1941 MRN: [id number]   Date of Service  07/12/2023  HPI/Events of Note  82 year old patient with history of CAD status post stenting, aortic stenosis, TAVR, left bundle branch block, hypertension, hyperlipidemia, diabetes, CKD stage IIIa, hearing loss and C-spine fusion who presented after being found unresponsive and pulseless.   Concern for anoxic brain injury.  Has been in RASS of -4 to -5.  Has been intermittently shaking, no concern for neurology.  When turned, the patient had additional shakes but hypertension associated with it  eICU Interventions  No ventilator dyssynchrony, hemodynamically stable at this time.   Unexplained "shakes "but suspect myoclonic jerking in the setting of anoxia.  No immediate intervention is necessary.     Intervention Category Minor Interventions: Clinical assessment - ordering diagnostic tests  Heaven Wandell 07/12/2023, 1:20 AM

## 2023-07-12 NOTE — Progress Notes (Signed)
PRN versed administered for new episode of seizure-like activity, continuing to monitor. Activity decreased, hypertension resolving.

## 2023-07-12 NOTE — Procedures (Signed)
Patient Name: Andre Jimenez  MRN: [id number]  Epilepsy Attending: Charlsie Quest  Referring Physician/Provider: Oretha Milch, MD  Duration: 07/12/2023 0428 to 07/12/2023 1342   Patient history: 82yo M s/p cardiac arrest getting eeg to evaluate for seizure   Level of alertness: comatose   AEDs during EEG study: LEV, VPA   Technical aspects: This EEG study was done with scalp electrodes positioned according to the 10-20 International system of electrode placement. Electrical activity was reviewed with band pass filter of 1-70Hz , sensitivity of 7 uV/mm, display speed of 11mm/sec with a 60Hz  notched filter applied as appropriate. EEG data were recorded continuously and digitally stored.  Video monitoring was available and reviewed as appropriate.   Description: EEG initially showed near continuous generalized sharply contoured 3-5Hz  theta-delta slowing admixed with 1-2 seconds of generalized  suppression. Generalized spikes were noted. Hyperventilation and photic stimulation were not performed.      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 No seizures were noted.    Lael Pilch Annabelle Harman

## 2023-07-12 NOTE — Procedures (Signed)
Patient Name: Andre Jimenez  MRN: [id number]  Epilepsy Attending: Charlsie Quest  Referring Physician/Provider: Oretha Milch, MD  Duration: 07/11/2023 4098 to 07/12/2023 1191   Patient history: 82yo M s/p cardiac arrest getting eeg to evaluate for seizure   Level of alertness: comatose   AEDs during EEG study: LEV, VPA   Technical aspects: This EEG study was done with scalp electrodes positioned according to the 10-20 International system of electrode placement. Electrical activity was reviewed with band pass filter of 1-70Hz , sensitivity of 7 uV/mm, display speed of 73mm/sec with a 60Hz  notched filter applied as appropriate. EEG data were recorded continuously and digitally stored.  Video monitoring was available and reviewed as appropriate.   Description: EEG initially showed near continuous generalized sharply contoured 3-5Hz  theta-delta slowing admixed with 1-2 seconds of generalized  suppression. Generalized spikes were noted. Hyperventilation and photic stimulation were not performed.      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 No seizures were noted.    [name]

## 2023-07-12 NOTE — Progress Notes (Signed)
Neurology Progress Note  Brief HPI: 82 year old patient with history of CAD status post stenting, aortic stenosis, TAVR, left bundle branch block, hypertension, hyperlipidemia, diabetes, CKD stage IIIa, hearing loss and C-spine fusion who presented yesterday after being found unresponsive and pulseless.  CPR was performed first by family and then EMS, and patient was found to be in V. tach.  ROSC was achieved, and patient had a total downtime of about 20 minutes.  He developed myoclonic jerking after intubation, and EEG demonstrates frequent myoclonic seizures.  Seizures abated with Keppra, VPA, Versed and propofol. Sedation weaned off.  Subjective: Off sedation. LTM read pending. Having episodes of tremoring/shivering and hypertension that are provoked by stimulus.  Exam: Vitals:   07/12/23 0700 07/12/23 0744  BP: (!) 108/46 (!) 176/65  Pulse: 71   Resp: (!) 23 (!) 25  Temp: 99.7 F (37.6 C)   SpO2: 100% 99%   Gen: In bed, NAD Resp: non-labored breathing, no acute distress Abd: soft, nt  Neuro (intubated, off sedation): Patient is unresponsive to voice or noxious stimuli.  He is unable to follow commands.  Pupils are both reactive, right pupil is to drop shaped and irregular.  Oculocephalic reflex and corneal reflexes are absent, cough is weak, gag reflex is absent.  Had ?mild RLE withdrawal to proximal pinch. Tone is flaccid in all extremities.  Pertinent Labs:    Latest Ref Rng & Units 07/12/2023    4:45 AM 07/11/2023    7:33 AM 07/11/2023    4:03 AM  CBC  WBC 4.0 - 10.5 K/uL 6.8   9.0   Hemoglobin 13.0 - 17.0 g/dL 16.1  09.6  04.5   Hematocrit 39.0 - 52.0 % 32.8  33.0  36.5   Platelets 150 - 400 K/uL 165   215        Latest Ref Rng & Units 07/12/2023    4:45 AM 07/11/2023    7:33 AM 07/11/2023    4:03 AM  BMP  Glucose 70 - 99 mg/dL 409   811   BUN 8 - 23 mg/dL 35   21   Creatinine 9.14 - 1.24 mg/dL 7.82   9.56   Sodium 213 - 145 mmol/L 144  141  143   Potassium 3.5  - 5.1 mmol/L 4.1  4.2  4.0   Chloride 98 - 111 mmol/L 114   108   CO2 22 - 32 mmol/L 22   22   Calcium 8.9 - 10.3 mg/dL 7.8   8.2   Lactate 2.9  Imaging Reviewed:  CT head: No acute abnormality  EEG 10/8: Generalized myoclonic seizures every 10 seconds to 1 minute and background suppression EEG 07/10/2023 0428 to 07/11/2023 0428  This study was initially suggestive of profound diffuse encephalopathy. Gradually as sedation was weaned, EEG improved and was suggestive of severe diffuse encephalopathy. No seizures were noted.   Assessment: 82 year old patient with history of CAD, aortic stenosis status post TAVR, hypertension, hyperlipidemia, CKD, hearing loss and C-spine fusion presents after sudden cardiac arrest at home with total downtime of about 20 minutes.  He was found to have myoclonic seizures on EEG post intubation. Myoclonic seizures controlled with depakote and Keppra and now weaned off Versed and propofol with no further seizures for several hours on LTM EEG and taken off LTM  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, however, prognostication cannot be performed at this time due to seizure activity and presence of multiple sedating medications  Recommendations: -Continue Keppra  750 mg twice daily -Continue Depakote 250 mg IV every 6 - discontinue LTM EEG - MRI Brain is pending  Plan discussed with wide Dotty at the bedside.  Erick Blinks, MD [facility] [phone]   If 7pm to 7am, please call on call as listed on AMION.

## 2023-07-13 DIAGNOSIS — I469 Cardiac arrest, cause unspecified: Secondary | ICD-10-CM | POA: Diagnosis not present

## 2023-07-13 LAB — CBC
HCT: 32.4 % — ABNORMAL LOW (ref 39.0–52.0)
Hemoglobin: 10.1 g/dL — ABNORMAL LOW (ref 13.0–17.0)
MCH: 29.6 pg (ref 26.0–34.0)
MCHC: 31.2 g/dL (ref 30.0–36.0)
MCV: 95 fL (ref 80.0–100.0)
Platelets: 166 10*3/uL (ref 150–400)
RBC: 3.41 MIL/uL — ABNORMAL LOW (ref 4.22–5.81)
RDW: 13.8 % (ref 11.5–15.5)
WBC: 5.9 10*3/uL (ref 4.0–10.5)
nRBC: 0 % (ref 0.0–0.2)

## 2023-07-13 LAB — BASIC METABOLIC PANEL
Anion gap: 10 (ref 5–15)
BUN: 38 mg/dL — ABNORMAL HIGH (ref 8–23)
CO2: 21 mmol/L — ABNORMAL LOW (ref 22–32)
Calcium: 8.2 mg/dL — ABNORMAL LOW (ref 8.9–10.3)
Chloride: 115 mmol/L — ABNORMAL HIGH (ref 98–111)
Creatinine, Ser: 1.2 mg/dL (ref 0.61–1.24)
GFR, Estimated: >60 mL/min (ref >60)
Glucose, Bld: 256 mg/dL — ABNORMAL HIGH (ref 70–99)
Potassium: 3.8 mmol/L (ref 3.5–5.1)
Sodium: 146 mmol/L — ABNORMAL HIGH (ref 135–145)

## 2023-07-13 LAB — GLUCOSE, CAPILLARY
Glucose-Capillary: 118 mg/dL — ABNORMAL HIGH (ref 70–99)
Glucose-Capillary: 194 mg/dL — ABNORMAL HIGH (ref 70–99)
Glucose-Capillary: 195 mg/dL — ABNORMAL HIGH (ref 70–99)

## 2023-07-13 LAB — MAGNESIUM: Magnesium: 2.5 mg/dL — ABNORMAL HIGH (ref 1.7–2.4)

## 2023-07-13 LAB — TRIGLYCERIDES: Triglycerides: 124 mg/dL (ref <150)

## 2023-07-13 MED ORDER — ACETAMINOPHEN 325 MG PO TABS: 650.0000 mg | ORAL_TABLET | Freq: Four times a day (QID) | ORAL | Status: DC | PRN

## 2023-07-13 MED ORDER — POLYVINYL ALCOHOL 1.4 % OP SOLN: 1.0000 [drp] | Freq: Four times a day (QID) | OPHTHALMIC | Status: DC | PRN

## 2023-07-13 MED ORDER — GLYCOPYRROLATE 0.2 MG/ML IJ SOLN: 0.2000 mg | INTRAMUSCULAR | Status: DC | PRN

## 2023-07-13 MED ORDER — SODIUM CHLORIDE 0.9 % IV SOLN: INTRAVENOUS | Status: DC

## 2023-07-13 MED ORDER — FENTANYL BOLUS VIA INFUSION
100.0000 ug | INTRAVENOUS | Status: DC | PRN
  Administered 2023-07-13 (×6): 100 ug via INTRAVENOUS

## 2023-07-13 MED ORDER — ACETAMINOPHEN 650 MG RE SUPP: 650.0000 mg | Freq: Four times a day (QID) | RECTAL | Status: DC | PRN

## 2023-07-13 MED ORDER — GLYCOPYRROLATE 0.2 MG/ML IJ SOLN
0.2000 mg | INTRAMUSCULAR | Status: DC | PRN
  Administered 2023-07-13: 0.2 mg via INTRAVENOUS
  Filled 2023-07-13: qty 1

## 2023-07-13 MED ORDER — FENTANYL 2500MCG IN NS 250ML (10MCG/ML) PREMIX INFUSION
0.0000 ug/h | INTRAVENOUS | Status: DC
  Administered 2023-07-13: 50 ug/h via INTRAVENOUS
  Filled 2023-07-13: qty 250

## 2023-07-13 MED ORDER — GLYCOPYRROLATE 1 MG PO TABS: 1.0000 mg | ORAL_TABLET | ORAL | Status: DC | PRN

## 2023-07-25 ENCOUNTER — Other Ambulatory Visit (HOSPITAL_COMMUNITY): Payer: No Typology Code available for payment source

## 2023-08-02 NOTE — Progress Notes (Signed)
Advanced Heart Failure Rounding Note  PCP-Cardiologist: None   Subjective:    Off pressors.   Now back on propofol to suppress myoclonic jerking.   Brain MRI with diffuse anoxic injury   Rhythm stable    Objective:   Weight Range: 81.8 kg Body mass index is 25.88 kg/m.   Vital Signs:   Temp:  [97.9 F (36.6 C)-99.7 F (37.6 C)] 98.6 F (37 C) (10/12 0900) Pulse Rate:  [53-78] 54 (10/12 0900) Resp:  [17-27] 22 (10/12 0900) BP: (98-150)/(44-72) 112/52 (10/12 0900) SpO2:  [96 %-100 %] 100 % (10/12 0900) Arterial Line BP: (118-197)/(37-78) 138/42 (10/12 0900) FiO2 (%):  [40 %] 40 % (10/12 0817) Last BM Date :  (PTA)  Weight change: Filed Weights   07/10/23 0233 07/11/23 0500  Weight: 83.9 kg 81.8 kg    Intake/Output:   Intake/Output Summary (Last 24 hours) at 07/03/2023 0924 Last data filed at 07/15/2023 0900 Gross per 24 hour  Intake 2499.07 ml  Output 1985 ml  Net 514.07 ml      Physical Exam   General:  Intubated. Non-responsive HEENT: normal + ETT Neck: supple.  Cor: Regular rate & rhythm. No rubs, gallops or murmurs. Lungs: clear Abdomen: soft, nontender, nondistended. No hepatosplenomegaly. No bruits or masses. Good bowel sounds. Extremities: no cyanosis, clubbing, rash, edema Neuro: unresponsive on vent   Telemetry   SR 50-70s Personally reviewed   Labs    CBC Recent Labs    07/12/23 0445 07/25/2023 0434  WBC 6.8 5.9  HGB 10.3* 10.1*  HCT 32.8* 32.4*  MCV 97.3 95.0  PLT 165 166   Basic Metabolic Panel Recent Labs    63/87/56 1514 07/12/23 0445 07/23/2023 0434  NA  --  144 146*  K  --  4.1 3.8  CL  --  114* 115*  CO2  --  22 21*  GLUCOSE  --  195* 256*  BUN  --  35* 38*  CREATININE  --  1.34* 1.20  CALCIUM  --  7.8* 8.2*  MG 2.7* 2.7* 2.5*  PHOS 3.4 2.4*  --    Liver Function Tests No results for input(s): "AST", "ALT", "ALKPHOS", "BILITOT", "PROT", "ALBUMIN" in the last 72 hours.  No results for input(s):  "LIPASE", "AMYLASE" in the last 72 hours. Cardiac Enzymes No results for input(s): "CKTOTAL", "CKMB", "CKMBINDEX", "TROPONINI" in the last 72 hours.  BNP: BNP (last 3 results) Recent Labs    04/30/23 1246 07/04/23 1146 07/09/23 0039  BNP 217.7* 307.4* 626.8*    ProBNP (last 3 results) No results for input(s): "PROBNP" in the last 8760 hours.   D-Dimer No results for input(s): "DDIMER" in the last 72 hours. Hemoglobin A1C No results for input(s): "HGBA1C" in the last 72 hours.  Fasting Lipid Panel Recent Labs    07/17/2023 0434  TRIG 124   Thyroid Function Tests No results for input(s): "TSH", "T4TOTAL", "T3FREE", "THYROIDAB" in the last 72 hours.  Invalid input(s): "FREET3"  Other results:   Imaging    MR BRAIN WO CONTRAST  Result Date: 07/12/2023 CLINICAL DATA:  Anoxic brain damage. Poor neuro exam s/p cardiac arrest with myoclonus. EXAM: MRI HEAD WITHOUT CONTRAST TECHNIQUE: Multiplanar, multiecho pulse sequences of the brain and surrounding structures were obtained without intravenous contrast. COMPARISON:  Head CT Jul 22, 2023 FINDINGS: Brain: Areas of largely symmetric restricted diffusion are seen involving cerebral cortex bilaterally including in the perirolandic region, cingulate gyrus, and occipital lobes, and there is also symmetric restricted diffusion in  the basal ganglia and thalami. There is only very mild associated T2 FLAIR hyperintensity without significant swelling or mass effect. Punctate foci of more intensely restricted diffusion involve cortex and subcortical white matter in the left parietal and posterior right frontal lobes. There are small chronic infarcts involving left frontal, parietal lateral parietal, and bilateral occipital cortex, deep white matter of the left greater than right cerebral hemispheres, and left greater than right cerebellar hemispheres, and there are chronic blood products associated with many of these chronic infarcts. There is  mild cerebral atrophy. No mass, midline shift, or extra-axial fluid collection is identified. Vascular: Major intracranial vascular flow voids are preserved. Skull and upper cervical spine: Unremarkable bone marrow signal. Sinuses/Orbits: Right cataract extraction. Mucosal thickening in the paranasal sinuses, including circumferential thickening in the left sphenoid and left maxillary sinuses with fluid in the latter. Trace left mastoid fluid. Other: None. IMPRESSION: 1. Findings consistent with global hypoxic-ischemic injury. No significant swelling or herniation. 2. Additional punctate foci of restricted diffusion in the left parietal and right frontal lobes which could reflect superimposed acute embolic infarcts. 3. Numerous chronic infarcts as above. Electronically Signed   By: Sebastian Ache M.D.   On: 07/12/2023 16:50     Medications:     Scheduled Medications:  amiodarone  200 mg Per Tube BID   aspirin  81 mg Per Tube Daily   atorvastatin  40 mg Per Tube Daily   Chlorhexidine Gluconate Cloth  6 each Topical Daily   docusate  100 mg Per Tube BID   free water  200 mL Per Tube Q8H   heparin injection (subcutaneous)  5,000 Units Subcutaneous Q8H   insulin aspart  0-15 Units Subcutaneous Q4H   mouth rinse  15 mL Mouth Rinse Q2H   pantoprazole (PROTONIX) IV  40 mg Intravenous Q24H   polyethylene glycol  17 g Per Tube Daily   potassium chloride  20 mEq Per Tube Once   sodium chloride flush  10-40 mL Intracatheter Q12H    Infusions:  ceFEPime (MAXIPIME) IV Stopped (07/23/2023 0252)   feeding supplement (VITAL AF 1.2 CAL) 60 mL/hr at 07/15/2023 0900   levETIRAcetam 750 mg (07/04/2023 0908)   norepinephrine (LEVOPHED) Adult infusion Stopped (07/12/23 0450)   propofol (DIPRIVAN) infusion 40 mcg/kg/min (07/21/2023 0900)   valproate sodium Stopped (08/01/2023 0539)   vasopressin Stopped (07/11/23 1524)    PRN Medications: acetaminophen, docusate, fentaNYL (SUBLIMAZE) injection, midazolam, mouth rinse,  polyethylene glycol, sodium chloride flush    Patient Profile   82 y.o. male with history of CAD s/p LAD PCI in 2021 (nonobstructive dz on repeat LHC in 2023), aortic stenosis s/p TAVR, chronic systolic HF and chronic LBBB. Admitted for out of hospital VT arrest.   Assessment/Plan   1. VT arrest: has baseline cardiomyopathy, last echo with EF 35-40%.  HS-TnI elevated 562 => 1221.  Mg was low at 1.3.  Suspect VT arrest due to cardiomyopathy rather than ischemia (doubt ACS).   - Rhythm stable. Rate slow but acceptable. - continue PO amiodarone 200 mg bid per tube - keep K > 4.0 and Mg > 2.0 - cMRI, ECG and exam c/w severe anoxic/hypoxic brain injury. Prognosis poor for meaningful recovery. Family meetings ongoing.  2. Chronic systolic CHF: Echo in 5/23 with EF 30-35%, normally-functioning bioprosthetic AoV and normal RV.  Most recent prior echo from 5/22 showed EF 50-55%.  Fall in EF was associated with CHF exacerbation and atypical chest pain.  He also has LBBB that is new  since 3/22.  RHC/LHC was done in 6/23; filling pressures were optimized and CO was normal, moderate disease in OM2 did not explain fall in EF.  Repeat Echo 12/23 EF 30% with global hypokinesis, normal RV, normal bioprosthetic aortic valve with mean gradient 10 and no AI (s/p TAVR), IVC normal.  Echo in 7/24 showed EF mildly better at 35-40% with septal-lateral dyssynchrony, normal RV, IVC normal, s/p TAVR with mean gradient 13 mmHg and no paravalvular leakage. Nonischemic cardiomyopathy, ?LBBB cardiomyopathy. NYHA class II and stable symptoms prior to this event.  Was awaiting cardiac MRI to help make decision on CRT-D or CRT-P device. Echo post arrest, EF 25-30%, RV mildly reduced. No RWMA to suggest ischemia.  - Now off all pressors. Hemodynamically stable 3. CAD: PCI to proximal LAD pre-TAVR in 4/21, not associated with ACS.  Cath in 6/23 showed 70% OM2 stenosis, medically managed. No chest pain at home.  VT arrest as above,  HS-TnI elevated but suspect this may be demand ischemia related to arrest.  Had been having no chest pain prior.  Doubt ACS.  - Continue ASA 81 mg  - Continue atorvastatin 40 daily.  - No evidence of ongoing ischemia. Not role for cath at this point 4. 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  Baseline Scr ~1.3. Creatinine 1.57 on admit and peaked to 1.9. Improved today at 1.2 5. Aortic stenosis: S/p TAVR with [employer] Evolut valve, normal valve function on 7/24 echo.   - stable on echo this admit  6. Neuro: Myoclonic jerking earlier. Not responding to stimuli off sedation. Worried that this signifies anoxic brain injury from arrest.  More seizure activity this am - cMRI, ECG and exam c/w severe anoxic/hypoxic brain injury. Prognosis very poor for meaningful recovery. Family meetings ongoing.  7. Acute hypoxemic respiratory failure: In setting of arrest.  - vent management per PCCM   He has severe anoxic brain injury. Probability of meaningful outcome is very low. Family meetings ongoing. Would suggest transition to comfort care.  The AHF team will s/o. Call with questions    CRITICAL CARE Performed by: Arvilla Meres  Total critical care time: 40 minutes  Critical care time was exclusive of separately billable procedures and treating other patients.  Critical care was necessary to treat or prevent imminent or life-threatening deterioration.  Critical care was time spent personally by me (independent of midlevel providers or residents) on the following activities: development of treatment plan with patient and/or surrogate as well as nursing, discussions with consultants, evaluation of patient's response to treatment, examination of patient, obtaining history from patient or surrogate, ordering and performing treatments and interventions, ordering and review of laboratory studies, ordering and review of radiographic studies, pulse oximetry and re-evaluation of patient's condition.   Length of Stay:  5  Arvilla Meres, MD  07-21-23, 9:24 AM  Advanced Heart Failure Team Pager [phone] (M-F; 7a - 5p)  Please contact [facility] for night-coverage after hours (5p -7a ) and weekends on [email or web address]

## 2023-08-02 NOTE — Progress Notes (Signed)
Patient extubated per order. Family and RN at bedside.

## 2023-08-02 NOTE — Discharge Summary (Signed)
bone lesion. CT ABDOMEN AND PELVIS FINDINGS Hepatobiliary: Cholelithiasis without pericholecystic inflammatory change. Liver unremarkable. No intra or extrahepatic biliary ductal dilation. Pancreas: Unremarkable Spleen: Unremarkable Adrenals/Urinary Tract: Adrenal glands are unremarkable. Kidneys are normal, without renal calculi, focal lesion, or hydronephrosis. Bladder is unremarkable. Stomach/Bowel: Stomach is within normal limits. Appendix absent. No evidence of bowel wall thickening, distention, or inflammatory changes. Vascular/Lymphatic: Extensive aortoiliac atherosclerotic calcification. No aortic aneurysm. No pathologic adenopathy within the abdomen and pelvis. Reproductive: Marked prostatic hypertrophy. Other: No abdominal wall hernia. Musculoskeletal: L4-5 lumbar fusion with instrumentation and posterior compression of L4 has been performed. No acute bone abnormality. No lytic or blastic bone lesion. IMPRESSION: 1. No acute intracranial injury. No calvarial fracture. 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 3. No acute fracture or listhesis of the cervical spine. 4. 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 5. 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 Superimposed bilateral neuroforaminal narrowing. 6. No acute intrathoracic or  intra-abdominal injury. 7. Extensive multi-vessel coronary artery calcification. 8. Cholelithiasis. 9. Marked prostatic hypertrophy. Aortic Atherosclerosis (ICD10-I70.0). Electronically Signed   By: Helyn Numbers M.D.   On: 07/15/2023 22:21   CT CHEST ABDOMEN PELVIS WO CONTRAST  Result Date: 07/04/2023 CLINICAL DATA:  unwitnessed arrest/fall; unwitnessed arrest; unwitnessed fall, cardiac arrest EXAM: CT HEAD WITHOUT CONTRAST CT CERVICAL SPINE WITHOUT CONTRAST CT CHEST, ABDOMEN AND PELVIS WITHOUT CONTRAST TECHNIQUE: Contiguous axial images were obtained from the base of the skull through the vertex without intravenous contrast. Multidetector CT imaging of the cervical spine was performed without intravenous contrast. Multiplanar CT image reconstructions were also generated. Multidetector CT imaging of the chest, abdomen and pelvis was performed following the standard protocol without IV contrast. RADIATION DOSE REDUCTION: This exam was performed according to the departmental dose-optimization program which includes automated exposure control, adjustment of the mA and/or kV according to patient size and/or use of iterative reconstruction technique. COMPARISON:  10/16/2022 FINDINGS: CT HEAD FINDINGS Brain: Normal anatomic configuration of the brain. 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 Tiny remote lacunar infarcts are noted within the left centrum semiovale. Small remote infarct within the left cerebellar hemisphere. No acute intracranial hemorrhage or infarct. No abnormal mass effect or midline shift. No abnormal intra or extra-axial mass lesion. Ventricular size is normal. Cerebellum is otherwise unremarkable. Vascular: No hyperdense vessel or unexpected calcification. Skull: Normal. Negative for fracture or focal lesion. Sinuses/Orbits: The orbits are unremarkable. There is extensive mucosal thickening of the left maxillary sinus with layering fluid in  keeping with possible changes of acute sinusitis. Mucosal thickening and layering fluid with central calcification is noted within the left sphenoid sinus in keeping with changes of chronic sinusitis in this location. Remaining paranasal sinuses are clear. Other: Mastoid air cells and middle ear cavities are clear. CT CERVICAL FINDINGS Alignment: Normal. Skull base and vertebrae: Craniocervical alignment is normal. The atlantodental interval is not widened. Anterior cervical discectomy and fusion with instrumentation of C5-C7 has been performed with solid incorporation of interbody bone graft and ankylosis of the right facet joints at these levels. There are bulky bridging anterior disc osteophytes noted at C2-C4. Ankylosis of the left facet joints of C2-3. No acute fracture of the cervical spine. 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 CT ABDOMEN AND PELVIS FINDINGS Hepatobiliary: Cholelithiasis without pericholecystic inflammatory change. Liver unremarkable. No intra or extrahepatic biliary ductal dilation. Pancreas: Unremarkable Spleen: Unremarkable Adrenals/Urinary Tract: Adrenal glands are unremarkable. Kidneys are normal, without renal calculi, focal lesion, or hydronephrosis. Bladder is unremarkable. Stomach/Bowel: Stomach is within normal limits. Appendix absent. No evidence of bowel wall thickening, distention, or inflammatory changes. Vascular/Lymphatic: Extensive aortoiliac atherosclerotic calcification. No aortic aneurysm. No pathologic adenopathy within the abdomen and pelvis. Reproductive: Marked prostatic hypertrophy. Other: No abdominal wall hernia. Musculoskeletal: L4-5 lumbar fusion with instrumentation and posterior compression of L4 has been performed. No acute bone abnormality. No lytic or blastic bone lesion. IMPRESSION: 1. No acute intracranial injury. No calvarial fracture. 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 3. No acute fracture or listhesis of the cervical spine. 4. 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 5. 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 Superimposed bilateral neuroforaminal narrowing. 6. No acute intrathoracic or  intra-abdominal injury. 7. Extensive multi-vessel coronary artery calcification. 8. Cholelithiasis. 9. Marked prostatic hypertrophy. Aortic Atherosclerosis (ICD10-I70.0). Electronically Signed   By: Helyn Numbers M.D.   On: 07/15/2023 22:21   CT CHEST ABDOMEN PELVIS WO CONTRAST  Result Date: 07/04/2023 CLINICAL DATA:  unwitnessed arrest/fall; unwitnessed arrest; unwitnessed fall, cardiac arrest EXAM: CT HEAD WITHOUT CONTRAST CT CERVICAL SPINE WITHOUT CONTRAST CT CHEST, ABDOMEN AND PELVIS WITHOUT CONTRAST TECHNIQUE: Contiguous axial images were obtained from the base of the skull through the vertex without intravenous contrast. Multidetector CT imaging of the cervical spine was performed without intravenous contrast. Multiplanar CT image reconstructions were also generated. Multidetector CT imaging of the chest, abdomen and pelvis was performed following the standard protocol without IV contrast. RADIATION DOSE REDUCTION: This exam was performed according to the departmental dose-optimization program which includes automated exposure control, adjustment of the mA and/or kV according to patient size and/or use of iterative reconstruction technique. COMPARISON:  10/16/2022 FINDINGS: CT HEAD FINDINGS Brain: Normal anatomic configuration of the brain. 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 Tiny remote lacunar infarcts are noted within the left centrum semiovale. Small remote infarct within the left cerebellar hemisphere. No acute intracranial hemorrhage or infarct. No abnormal mass effect or midline shift. No abnormal intra or extra-axial mass lesion. Ventricular size is normal. Cerebellum is otherwise unremarkable. Vascular: No hyperdense vessel or unexpected calcification. Skull: Normal. Negative for fracture or focal lesion. Sinuses/Orbits: The orbits are unremarkable. There is extensive mucosal thickening of the left maxillary sinus with layering fluid in  keeping with possible changes of acute sinusitis. Mucosal thickening and layering fluid with central calcification is noted within the left sphenoid sinus in keeping with changes of chronic sinusitis in this location. Remaining paranasal sinuses are clear. Other: Mastoid air cells and middle ear cavities are clear. CT CERVICAL FINDINGS Alignment: Normal. Skull base and vertebrae: Craniocervical alignment is normal. The atlantodental interval is not widened. Anterior cervical discectomy and fusion with instrumentation of C5-C7 has been performed with solid incorporation of interbody bone graft and ankylosis of the right facet joints at these levels. There are bulky bridging anterior disc osteophytes noted at C2-C4. Ankylosis of the left facet joints of C2-3. No acute fracture of the cervical spine. 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 CT ABDOMEN AND PELVIS FINDINGS Hepatobiliary: Cholelithiasis without pericholecystic inflammatory change. Liver unremarkable. No intra or extrahepatic biliary ductal dilation. Pancreas: Unremarkable Spleen: Unremarkable Adrenals/Urinary Tract: Adrenal glands are unremarkable. Kidneys are normal, without renal calculi, focal lesion, or hydronephrosis. Bladder is unremarkable. Stomach/Bowel: Stomach is within normal limits. Appendix absent. No evidence of bowel wall thickening, distention, or inflammatory changes. Vascular/Lymphatic: Extensive aortoiliac atherosclerotic calcification. No aortic aneurysm. No pathologic adenopathy within the abdomen and pelvis. Reproductive: Marked prostatic hypertrophy. Other: No abdominal wall hernia. Musculoskeletal: L4-5 lumbar fusion with instrumentation and posterior compression of L4 has been performed. No acute bone abnormality. No lytic or blastic bone lesion. IMPRESSION: 1. No acute intracranial injury. No calvarial fracture. 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 3. No acute fracture or listhesis of the cervical spine. 4. 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 5. 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 Superimposed bilateral neuroforaminal narrowing. 6. No acute intrathoracic or  intra-abdominal injury. 7. Extensive multi-vessel coronary artery calcification. 8. Cholelithiasis. 9. Marked prostatic hypertrophy. Aortic Atherosclerosis (ICD10-I70.0). Electronically Signed   By: Helyn Numbers M.D.   On: 07/15/2023 22:21   CT CHEST ABDOMEN PELVIS WO CONTRAST  Result Date: 07/04/2023 CLINICAL DATA:  unwitnessed arrest/fall; unwitnessed arrest; unwitnessed fall, cardiac arrest EXAM: CT HEAD WITHOUT CONTRAST CT CERVICAL SPINE WITHOUT CONTRAST CT CHEST, ABDOMEN AND PELVIS WITHOUT CONTRAST TECHNIQUE: Contiguous axial images were obtained from the base of the skull through the vertex without intravenous contrast. Multidetector CT imaging of the cervical spine was performed without intravenous contrast. Multiplanar CT image reconstructions were also generated. Multidetector CT imaging of the chest, abdomen and pelvis was performed following the standard protocol without IV contrast. RADIATION DOSE REDUCTION: This exam was performed according to the departmental dose-optimization program which includes automated exposure control, adjustment of the mA and/or kV according to patient size and/or use of iterative reconstruction technique. COMPARISON:  10/16/2022 FINDINGS: CT HEAD FINDINGS Brain: Normal anatomic configuration of the brain. 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 Tiny remote lacunar infarcts are noted within the left centrum semiovale. Small remote infarct within the left cerebellar hemisphere. No acute intracranial hemorrhage or infarct. No abnormal mass effect or midline shift. No abnormal intra or extra-axial mass lesion. Ventricular size is normal. Cerebellum is otherwise unremarkable. Vascular: No hyperdense vessel or unexpected calcification. Skull: Normal. Negative for fracture or focal lesion. Sinuses/Orbits: The orbits are unremarkable. There is extensive mucosal thickening of the left maxillary sinus with layering fluid in  keeping with possible changes of acute sinusitis. Mucosal thickening and layering fluid with central calcification is noted within the left sphenoid sinus in keeping with changes of chronic sinusitis in this location. Remaining paranasal sinuses are clear. Other: Mastoid air cells and middle ear cavities are clear. CT CERVICAL FINDINGS Alignment: Normal. Skull base and vertebrae: Craniocervical alignment is normal. The atlantodental interval is not widened. Anterior cervical discectomy and fusion with instrumentation of C5-C7 has been performed with solid incorporation of interbody bone graft and ankylosis of the right facet joints at these levels. There are bulky bridging anterior disc osteophytes noted at C2-C4. Ankylosis of the left facet joints of C2-3. No acute fracture of the cervical spine. 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 It is not intended to diagnose MRSA infection nor to guide or monitor treatment for MRSA infections. Test performance is not FDA approved in patients less than 57 years old. Performed at [facility], [address], [place], [place] [address]     Lab Basic Metabolic Panel: Recent Labs  Lab 07/12/23 0445 08/01/2023 0434  NA 144 146*  K 4.1 3.8  CL 114* 115*  CO2 22 21*  GLUCOSE  195* 256*  BUN 35* 38*  CREATININE 1.34* 1.20  CALCIUM 7.8* 8.2*  MG 2.7* 2.5*  PHOS 2.4*  --    Liver Function Tests: No results for input(s): "AST", "ALT", "ALKPHOS", "BILITOT", "PROT", "ALBUMIN" in the last 168 hours. No results for input(s): "LIPASE", "AMYLASE" in the last 168 hours. No results for input(s): "AMMONIA" in the last 168 hours. CBC: Recent Labs  Lab 07/12/23 0445 07/30/2023 0434  WBC 6.8 5.9  HGB 10.3* 10.1*  HCT 32.8* 32.4*  MCV 97.3 95.0  PLT 165 166   Cardiac Enzymes: No results for input(s): "CKTOTAL", "CKMB", "CKMBINDEX", "TROPONINI" in the last 168 hours. Sepsis Labs: Recent Labs  Lab 07/12/23 0445 08/01/2023 0434  PROCALCITON 0.30  --   WBC 6.8 5.9    Procedures/Operations  MRI, EEG, central line, arterial line.   Sayre Mazor 07/18/2023, 4:14 PM  bone lesion. CT ABDOMEN AND PELVIS FINDINGS Hepatobiliary: Cholelithiasis without pericholecystic inflammatory change. Liver unremarkable. No intra or extrahepatic biliary ductal dilation. Pancreas: Unremarkable Spleen: Unremarkable Adrenals/Urinary Tract: Adrenal glands are unremarkable. Kidneys are normal, without renal calculi, focal lesion, or hydronephrosis. Bladder is unremarkable. Stomach/Bowel: Stomach is within normal limits. Appendix absent. No evidence of bowel wall thickening, distention, or inflammatory changes. Vascular/Lymphatic: Extensive aortoiliac atherosclerotic calcification. No aortic aneurysm. No pathologic adenopathy within the abdomen and pelvis. Reproductive: Marked prostatic hypertrophy. Other: No abdominal wall hernia. Musculoskeletal: L4-5 lumbar fusion with instrumentation and posterior compression of L4 has been performed. No acute bone abnormality. No lytic or blastic bone lesion. IMPRESSION: 1. No acute intracranial injury. No calvarial fracture. 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 3. No acute fracture or listhesis of the cervical spine. 4. 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 5. 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 Superimposed bilateral neuroforaminal narrowing. 6. No acute intrathoracic or  intra-abdominal injury. 7. Extensive multi-vessel coronary artery calcification. 8. Cholelithiasis. 9. Marked prostatic hypertrophy. Aortic Atherosclerosis (ICD10-I70.0). Electronically Signed   By: Helyn Numbers M.D.   On: 07/15/2023 22:21   CT CHEST ABDOMEN PELVIS WO CONTRAST  Result Date: 07/04/2023 CLINICAL DATA:  unwitnessed arrest/fall; unwitnessed arrest; unwitnessed fall, cardiac arrest EXAM: CT HEAD WITHOUT CONTRAST CT CERVICAL SPINE WITHOUT CONTRAST CT CHEST, ABDOMEN AND PELVIS WITHOUT CONTRAST TECHNIQUE: Contiguous axial images were obtained from the base of the skull through the vertex without intravenous contrast. Multidetector CT imaging of the cervical spine was performed without intravenous contrast. Multiplanar CT image reconstructions were also generated. Multidetector CT imaging of the chest, abdomen and pelvis was performed following the standard protocol without IV contrast. RADIATION DOSE REDUCTION: This exam was performed according to the departmental dose-optimization program which includes automated exposure control, adjustment of the mA and/or kV according to patient size and/or use of iterative reconstruction technique. COMPARISON:  10/16/2022 FINDINGS: CT HEAD FINDINGS Brain: Normal anatomic configuration of the brain. 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 Tiny remote lacunar infarcts are noted within the left centrum semiovale. Small remote infarct within the left cerebellar hemisphere. No acute intracranial hemorrhage or infarct. No abnormal mass effect or midline shift. No abnormal intra or extra-axial mass lesion. Ventricular size is normal. Cerebellum is otherwise unremarkable. Vascular: No hyperdense vessel or unexpected calcification. Skull: Normal. Negative for fracture or focal lesion. Sinuses/Orbits: The orbits are unremarkable. There is extensive mucosal thickening of the left maxillary sinus with layering fluid in  keeping with possible changes of acute sinusitis. Mucosal thickening and layering fluid with central calcification is noted within the left sphenoid sinus in keeping with changes of chronic sinusitis in this location. Remaining paranasal sinuses are clear. Other: Mastoid air cells and middle ear cavities are clear. CT CERVICAL FINDINGS Alignment: Normal. Skull base and vertebrae: Craniocervical alignment is normal. The atlantodental interval is not widened. Anterior cervical discectomy and fusion with instrumentation of C5-C7 has been performed with solid incorporation of interbody bone graft and ankylosis of the right facet joints at these levels. There are bulky bridging anterior disc osteophytes noted at C2-C4. Ankylosis of the left facet joints of C2-3. No acute fracture of the cervical spine. 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 It is not intended to diagnose MRSA infection nor to guide or monitor treatment for MRSA infections. Test performance is not FDA approved in patients less than 57 years old. Performed at [facility], [address], [place], [place] [address]     Lab Basic Metabolic Panel: Recent Labs  Lab 07/12/23 0445 08/01/2023 0434  NA 144 146*  K 4.1 3.8  CL 114* 115*  CO2 22 21*  GLUCOSE  195* 256*  BUN 35* 38*  CREATININE 1.34* 1.20  CALCIUM 7.8* 8.2*  MG 2.7* 2.5*  PHOS 2.4*  --    Liver Function Tests: No results for input(s): "AST", "ALT", "ALKPHOS", "BILITOT", "PROT", "ALBUMIN" in the last 168 hours. No results for input(s): "LIPASE", "AMYLASE" in the last 168 hours. No results for input(s): "AMMONIA" in the last 168 hours. CBC: Recent Labs  Lab 07/12/23 0445 07/30/2023 0434  WBC 6.8 5.9  HGB 10.3* 10.1*  HCT 32.8* 32.4*  MCV 97.3 95.0  PLT 165 166   Cardiac Enzymes: No results for input(s): "CKTOTAL", "CKMB", "CKMBINDEX", "TROPONINI" in the last 168 hours. Sepsis Labs: Recent Labs  Lab 07/12/23 0445 08/01/2023 0434  PROCALCITON 0.30  --   WBC 6.8 5.9    Procedures/Operations  MRI, EEG, central line, arterial line.   Sayre Mazor 07/18/2023, 4:14 PM  bone lesion. CT ABDOMEN AND PELVIS FINDINGS Hepatobiliary: Cholelithiasis without pericholecystic inflammatory change. Liver unremarkable. No intra or extrahepatic biliary ductal dilation. Pancreas: Unremarkable Spleen: Unremarkable Adrenals/Urinary Tract: Adrenal glands are unremarkable. Kidneys are normal, without renal calculi, focal lesion, or hydronephrosis. Bladder is unremarkable. Stomach/Bowel: Stomach is within normal limits. Appendix absent. No evidence of bowel wall thickening, distention, or inflammatory changes. Vascular/Lymphatic: Extensive aortoiliac atherosclerotic calcification. No aortic aneurysm. No pathologic adenopathy within the abdomen and pelvis. Reproductive: Marked prostatic hypertrophy. Other: No abdominal wall hernia. Musculoskeletal: L4-5 lumbar fusion with instrumentation and posterior compression of L4 has been performed. No acute bone abnormality. No lytic or blastic bone lesion. IMPRESSION: 1. No acute intracranial injury. No calvarial fracture. 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 3. No acute fracture or listhesis of the cervical spine. 4. 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 5. 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 Superimposed bilateral neuroforaminal narrowing. 6. No acute intrathoracic or  intra-abdominal injury. 7. Extensive multi-vessel coronary artery calcification. 8. Cholelithiasis. 9. Marked prostatic hypertrophy. Aortic Atherosclerosis (ICD10-I70.0). Electronically Signed   By: Helyn Numbers M.D.   On: 07/15/2023 22:21   CT CHEST ABDOMEN PELVIS WO CONTRAST  Result Date: 07/04/2023 CLINICAL DATA:  unwitnessed arrest/fall; unwitnessed arrest; unwitnessed fall, cardiac arrest EXAM: CT HEAD WITHOUT CONTRAST CT CERVICAL SPINE WITHOUT CONTRAST CT CHEST, ABDOMEN AND PELVIS WITHOUT CONTRAST TECHNIQUE: Contiguous axial images were obtained from the base of the skull through the vertex without intravenous contrast. Multidetector CT imaging of the cervical spine was performed without intravenous contrast. Multiplanar CT image reconstructions were also generated. Multidetector CT imaging of the chest, abdomen and pelvis was performed following the standard protocol without IV contrast. RADIATION DOSE REDUCTION: This exam was performed according to the departmental dose-optimization program which includes automated exposure control, adjustment of the mA and/or kV according to patient size and/or use of iterative reconstruction technique. COMPARISON:  10/16/2022 FINDINGS: CT HEAD FINDINGS Brain: Normal anatomic configuration of the brain. 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 Tiny remote lacunar infarcts are noted within the left centrum semiovale. Small remote infarct within the left cerebellar hemisphere. No acute intracranial hemorrhage or infarct. No abnormal mass effect or midline shift. No abnormal intra or extra-axial mass lesion. Ventricular size is normal. Cerebellum is otherwise unremarkable. Vascular: No hyperdense vessel or unexpected calcification. Skull: Normal. Negative for fracture or focal lesion. Sinuses/Orbits: The orbits are unremarkable. There is extensive mucosal thickening of the left maxillary sinus with layering fluid in  keeping with possible changes of acute sinusitis. Mucosal thickening and layering fluid with central calcification is noted within the left sphenoid sinus in keeping with changes of chronic sinusitis in this location. Remaining paranasal sinuses are clear. Other: Mastoid air cells and middle ear cavities are clear. CT CERVICAL FINDINGS Alignment: Normal. Skull base and vertebrae: Craniocervical alignment is normal. The atlantodental interval is not widened. Anterior cervical discectomy and fusion with instrumentation of C5-C7 has been performed with solid incorporation of interbody bone graft and ankylosis of the right facet joints at these levels. There are bulky bridging anterior disc osteophytes noted at C2-C4. Ankylosis of the left facet joints of C2-3. No acute fracture of the cervical spine. 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 It is not intended to diagnose MRSA infection nor to guide or monitor treatment for MRSA infections. Test performance is not FDA approved in patients less than 57 years old. Performed at [facility], [address], [place], [place] [address]     Lab Basic Metabolic Panel: Recent Labs  Lab 07/12/23 0445 08/01/2023 0434  NA 144 146*  K 4.1 3.8  CL 114* 115*  CO2 22 21*  GLUCOSE  195* 256*  BUN 35* 38*  CREATININE 1.34* 1.20  CALCIUM 7.8* 8.2*  MG 2.7* 2.5*  PHOS 2.4*  --    Liver Function Tests: No results for input(s): "AST", "ALT", "ALKPHOS", "BILITOT", "PROT", "ALBUMIN" in the last 168 hours. No results for input(s): "LIPASE", "AMYLASE" in the last 168 hours. No results for input(s): "AMMONIA" in the last 168 hours. CBC: Recent Labs  Lab 07/12/23 0445 07/30/2023 0434  WBC 6.8 5.9  HGB 10.3* 10.1*  HCT 32.8* 32.4*  MCV 97.3 95.0  PLT 165 166   Cardiac Enzymes: No results for input(s): "CKTOTAL", "CKMB", "CKMBINDEX", "TROPONINI" in the last 168 hours. Sepsis Labs: Recent Labs  Lab 07/12/23 0445 08/01/2023 0434  PROCALCITON 0.30  --   WBC 6.8 5.9    Procedures/Operations  MRI, EEG, central line, arterial line.   Sayre Mazor 07/18/2023, 4:14 PM  bone lesion. CT ABDOMEN AND PELVIS FINDINGS Hepatobiliary: Cholelithiasis without pericholecystic inflammatory change. Liver unremarkable. No intra or extrahepatic biliary ductal dilation. Pancreas: Unremarkable Spleen: Unremarkable Adrenals/Urinary Tract: Adrenal glands are unremarkable. Kidneys are normal, without renal calculi, focal lesion, or hydronephrosis. Bladder is unremarkable. Stomach/Bowel: Stomach is within normal limits. Appendix absent. No evidence of bowel wall thickening, distention, or inflammatory changes. Vascular/Lymphatic: Extensive aortoiliac atherosclerotic calcification. No aortic aneurysm. No pathologic adenopathy within the abdomen and pelvis. Reproductive: Marked prostatic hypertrophy. Other: No abdominal wall hernia. Musculoskeletal: L4-5 lumbar fusion with instrumentation and posterior compression of L4 has been performed. No acute bone abnormality. No lytic or blastic bone lesion. IMPRESSION: 1. No acute intracranial injury. No calvarial fracture. 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 3. No acute fracture or listhesis of the cervical spine. 4. 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 5. 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 Superimposed bilateral neuroforaminal narrowing. 6. No acute intrathoracic or  intra-abdominal injury. 7. Extensive multi-vessel coronary artery calcification. 8. Cholelithiasis. 9. Marked prostatic hypertrophy. Aortic Atherosclerosis (ICD10-I70.0). Electronically Signed   By: Helyn Numbers M.D.   On: 07/15/2023 22:21   CT CHEST ABDOMEN PELVIS WO CONTRAST  Result Date: 07/04/2023 CLINICAL DATA:  unwitnessed arrest/fall; unwitnessed arrest; unwitnessed fall, cardiac arrest EXAM: CT HEAD WITHOUT CONTRAST CT CERVICAL SPINE WITHOUT CONTRAST CT CHEST, ABDOMEN AND PELVIS WITHOUT CONTRAST TECHNIQUE: Contiguous axial images were obtained from the base of the skull through the vertex without intravenous contrast. Multidetector CT imaging of the cervical spine was performed without intravenous contrast. Multiplanar CT image reconstructions were also generated. Multidetector CT imaging of the chest, abdomen and pelvis was performed following the standard protocol without IV contrast. RADIATION DOSE REDUCTION: This exam was performed according to the departmental dose-optimization program which includes automated exposure control, adjustment of the mA and/or kV according to patient size and/or use of iterative reconstruction technique. COMPARISON:  10/16/2022 FINDINGS: CT HEAD FINDINGS Brain: Normal anatomic configuration of the brain. 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 Tiny remote lacunar infarcts are noted within the left centrum semiovale. Small remote infarct within the left cerebellar hemisphere. No acute intracranial hemorrhage or infarct. No abnormal mass effect or midline shift. No abnormal intra or extra-axial mass lesion. Ventricular size is normal. Cerebellum is otherwise unremarkable. Vascular: No hyperdense vessel or unexpected calcification. Skull: Normal. Negative for fracture or focal lesion. Sinuses/Orbits: The orbits are unremarkable. There is extensive mucosal thickening of the left maxillary sinus with layering fluid in  keeping with possible changes of acute sinusitis. Mucosal thickening and layering fluid with central calcification is noted within the left sphenoid sinus in keeping with changes of chronic sinusitis in this location. Remaining paranasal sinuses are clear. Other: Mastoid air cells and middle ear cavities are clear. CT CERVICAL FINDINGS Alignment: Normal. Skull base and vertebrae: Craniocervical alignment is normal. The atlantodental interval is not widened. Anterior cervical discectomy and fusion with instrumentation of C5-C7 has been performed with solid incorporation of interbody bone graft and ankylosis of the right facet joints at these levels. There are bulky bridging anterior disc osteophytes noted at C2-C4. Ankylosis of the left facet joints of C2-3. No acute fracture of the cervical spine. 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 CT ABDOMEN AND PELVIS FINDINGS Hepatobiliary: Cholelithiasis without pericholecystic inflammatory change. Liver unremarkable. No intra or extrahepatic biliary ductal dilation. Pancreas: Unremarkable Spleen: Unremarkable Adrenals/Urinary Tract: Adrenal glands are unremarkable. Kidneys are normal, without renal calculi, focal lesion, or hydronephrosis. Bladder is unremarkable. Stomach/Bowel: Stomach is within normal limits. Appendix absent. No evidence of bowel wall thickening, distention, or inflammatory changes. Vascular/Lymphatic: Extensive aortoiliac atherosclerotic calcification. No aortic aneurysm. No pathologic adenopathy within the abdomen and pelvis. Reproductive: Marked prostatic hypertrophy. Other: No abdominal wall hernia. Musculoskeletal: L4-5 lumbar fusion with instrumentation and posterior compression of L4 has been performed. No acute bone abnormality. No lytic or blastic bone lesion. IMPRESSION: 1. No acute intracranial injury. No calvarial fracture. 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 3. No acute fracture or listhesis of the cervical spine. 4. 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 5. 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 Superimposed bilateral neuroforaminal narrowing. 6. No acute intrathoracic or  intra-abdominal injury. 7. Extensive multi-vessel coronary artery calcification. 8. Cholelithiasis. 9. Marked prostatic hypertrophy. Aortic Atherosclerosis (ICD10-I70.0). Electronically Signed   By: Helyn Numbers M.D.   On: 07/15/2023 22:21   CT CHEST ABDOMEN PELVIS WO CONTRAST  Result Date: 07/04/2023 CLINICAL DATA:  unwitnessed arrest/fall; unwitnessed arrest; unwitnessed fall, cardiac arrest EXAM: CT HEAD WITHOUT CONTRAST CT CERVICAL SPINE WITHOUT CONTRAST CT CHEST, ABDOMEN AND PELVIS WITHOUT CONTRAST TECHNIQUE: Contiguous axial images were obtained from the base of the skull through the vertex without intravenous contrast. Multidetector CT imaging of the cervical spine was performed without intravenous contrast. Multiplanar CT image reconstructions were also generated. Multidetector CT imaging of the chest, abdomen and pelvis was performed following the standard protocol without IV contrast. RADIATION DOSE REDUCTION: This exam was performed according to the departmental dose-optimization program which includes automated exposure control, adjustment of the mA and/or kV according to patient size and/or use of iterative reconstruction technique. COMPARISON:  10/16/2022 FINDINGS: CT HEAD FINDINGS Brain: Normal anatomic configuration of the brain. 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 Tiny remote lacunar infarcts are noted within the left centrum semiovale. Small remote infarct within the left cerebellar hemisphere. No acute intracranial hemorrhage or infarct. No abnormal mass effect or midline shift. No abnormal intra or extra-axial mass lesion. Ventricular size is normal. Cerebellum is otherwise unremarkable. Vascular: No hyperdense vessel or unexpected calcification. Skull: Normal. Negative for fracture or focal lesion. Sinuses/Orbits: The orbits are unremarkable. There is extensive mucosal thickening of the left maxillary sinus with layering fluid in  keeping with possible changes of acute sinusitis. Mucosal thickening and layering fluid with central calcification is noted within the left sphenoid sinus in keeping with changes of chronic sinusitis in this location. Remaining paranasal sinuses are clear. Other: Mastoid air cells and middle ear cavities are clear. CT CERVICAL FINDINGS Alignment: Normal. Skull base and vertebrae: Craniocervical alignment is normal. The atlantodental interval is not widened. Anterior cervical discectomy and fusion with instrumentation of C5-C7 has been performed with solid incorporation of interbody bone graft and ankylosis of the right facet joints at these levels. There are bulky bridging anterior disc osteophytes noted at C2-C4. Ankylosis of the left facet joints of C2-3. No acute fracture of the cervical spine. Soft tissues and spinal canal: Similar prior examination, there is thickening of the anterior longitudinal ligament superiorly, particularly at C1-2 resulting in high-grade central canal stenosis and

## 2023-08-02 NOTE — Progress Notes (Signed)
07/18/2023 1500  Spiritual Encounters  Type of Visit Initial  Care provided to: Pt and family  Conversation partners present during encounter Nurse  Referral source Family  Reason for visit End-of-life  OnCall Visit Yes   Ch responded to request for emotional and spiritual support. Pt's family was present at bedside. Ch encouraged storytelling and provided hospitality. Ch offered a word of for the family. Ch remain available when needed.

## 2023-08-02 NOTE — Progress Notes (Signed)
NAME:  Andre Jimenez, MRN:  [id number], DOB:  Jun 16, 1941, LOS: 5 ADMISSION DATE:  07/22/2023, CONSULTATION DATE:  10/7 REFERRING MD:  Dr. Ledon Snare, CHIEF COMPLAINT:  VT arrest   History of Present Illness:  Patient is a 82 yo M w/ pertinent PMH systolic chf, Aortic stenosis s/p TAVR 01/2020, LBBB, CAD, htn, t2dm, ckd stage 3a presents to [facility] ED on 10/7 for VT arrest.  On 10/7 patient doing well during the day. Wife found him unresponsive in yard and called EMS and started CPR for about 12 minutes by wife until ems arrived. Initial rhythm vtach and was shocked. Patient had multiple round of cpr with intermittent ROSC about 4 times. Patient was shocked 2 times during cpr and intubated. Estimated total ROSC about 20 minutes. Unresponsive post rosc and transported to [facility] ED.   On arrival BP stable. EKG w/ LBBB and no ST elevation which is similar to previous EKG. Bedside echo w/ EF about 30% and no evidence of R heart strain. Patient intubated and sats stable on vent. CXR no significant findings. Remains unresponsive. CT head no acute abnormality. CT chest/abd/pelvis no acute findings. VBG ph 7.19 and co2 43. LA 6.7. K 3.2. Glucose 320. Trop 562. Cards consulted. PCCM consulted for icu admission.   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 Now requiring vasopressors.  10/9 weaning sedation on EEG, NE requirements up, vaso added, abx- vanc/ cefepime 10/10 off pressors, weaning sedation on cEEG, some NSVT, Mag 1.6 10/11 MRI compatible with diffuse anoxic injury.  Restarted low-dose propofol for recurrent myoclonus.  Interim History / Subjective:   Off pressors this morning but remains on low-dose propofol.  Objective   Blood pressure 135/61, pulse (!) 59, temperature 99.1 F (37.3 C), resp. rate (!) 22, height 5\' 10"  (1.778 m), weight 81.8 kg, SpO2 99%. CVP:  [0 mmHg-44 mmHg] 6 mmHg  Vent Mode: PRVC FiO2 (%):  [40 %] 40 % Set Rate:  [22 bmp] 22 bmp Vt Set:  [440 mL] 440 mL PEEP:  [5 cmH20] 5 cmH20 Plateau Pressure:  [15 cmH20-19 cmH20] 15 cmH20   Intake/Output Summary (Last 24 hours) at August 02, 2023 1414 Last data filed at 08-02-23 1300 Gross per 24 hour  Intake 2589.82 ml  Output 1655 ml  Net 934.82 ml   Filed Weights   07/10/23 0233 07/11/23 0500  Weight: 83.9 kg 81.8 kg   Examination: General:  critically ill appearing elderly male lying in bed in NAD HEENT: MM pink/moist, ETT/ OGT, pupils 3/more reactive today, mild scleral edema, absent corneals Neuro: No myoclonus.  Brainstem areflexia.  Not  breathing over ventilator.  No wrist Bonsity painful stimulation. CV: rr, NSR, distant, RUE PICC PULM:  MV supported, to auscultation bilaterally.  GI: soft, bs+, ND, foley- cyu Extremities: warm/dry, trace generalized edema  Skin: no rashes   Ancillary tests personally reviewed  Mild hyponatremia 146.  Creatinine normal 1.2 Mild anemia 10.1. Assessment & Plan:   VT cardiac arrest on background of HFrEF with EF 25 to 30% despite ICD.  May have been exacerbated by hypomagnesemia. 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 Status post TAVR for aortic stenosis Coronary artery disease Acute hypoxic respiratory failure secondary to cardiac arrest with possible right lower lobe aspiration pneumonia. History of type 2 diabetes History of hypertension Shock postcardiac arrest now resolved  Plan:  -Family meeting today: Presented him with the evidence supporting dismal neurological prognosis, specifically advanced age, prolonged cardiac arrest, recurrent myoclonus,  evidence of anoxic injury on MRI and lack of meaningful neurological exam recovery even off sedation at nearly a week from the event. -They are in universal agreement that he would not wish to live in a vegetative state and have agreed to transition to comfort care.  CRITICAL CARE Performed by: Lynnell Catalan   Total critical care time: 35 minutes  Critical care time was exclusive of separately billable procedures and treating other patients.  Critical care was necessary to treat or prevent imminent or life-threatening deterioration.  Critical care was time spent personally by me on the following activities: development of treatment plan with patient and/or surrogate as well as nursing, discussions with consultants, evaluation of patient's response to treatment, examination of patient, obtaining history from patient or surrogate, ordering and performing treatments and interventions, ordering and review of laboratory studies, ordering and review of radiographic studies, pulse oximetry, re-evaluation of patient's condition and participation in multidisciplinary rounds.  Lynnell Catalan, MD [facility] ICU Physician [facility]  Pager: [phone] Mobile: [phone] After hours: [phone].

## 2023-08-02 NOTE — Progress Notes (Signed)
175cc of fentanyl wasted in stericycle with Eloise Harman, RN.

## 2023-08-02 DEATH — deceased
